# Patient Record
Sex: Male | Born: 1960 | Race: Black or African American | Hispanic: No | Smoking: Current every day smoker
Health system: Southern US, Community
[De-identification: ages and names within clinical notes are randomized; demographics above are authoritative.]

## PROBLEM LIST (undated history)

## (undated) DIAGNOSIS — R519 Headache, unspecified: Secondary | ICD-10-CM

## (undated) DIAGNOSIS — M542 Cervicalgia: Secondary | ICD-10-CM

## (undated) DIAGNOSIS — M47812 Spondylosis without myelopathy or radiculopathy, cervical region: Secondary | ICD-10-CM

## (undated) DIAGNOSIS — Z8719 Personal history of other diseases of the digestive system: Secondary | ICD-10-CM

## (undated) DIAGNOSIS — Z9889 Other specified postprocedural states: Secondary | ICD-10-CM

## (undated) DIAGNOSIS — F319 Bipolar disorder, unspecified: Secondary | ICD-10-CM

## (undated) DIAGNOSIS — F32A Depression, unspecified: Secondary | ICD-10-CM

## (undated) DIAGNOSIS — G8929 Other chronic pain: Secondary | ICD-10-CM

## (undated) DIAGNOSIS — M25519 Pain in unspecified shoulder: Secondary | ICD-10-CM

## (undated) DIAGNOSIS — R51 Headache: Secondary | ICD-10-CM

## (undated) DIAGNOSIS — I10 Essential (primary) hypertension: Secondary | ICD-10-CM

## (undated) DIAGNOSIS — F1410 Cocaine abuse, uncomplicated: Secondary | ICD-10-CM

## (undated) DIAGNOSIS — M1990 Unspecified osteoarthritis, unspecified site: Secondary | ICD-10-CM

## (undated) DIAGNOSIS — N179 Acute kidney failure, unspecified: Secondary | ICD-10-CM

## (undated) DIAGNOSIS — E7800 Pure hypercholesterolemia, unspecified: Secondary | ICD-10-CM

## (undated) DIAGNOSIS — F329 Major depressive disorder, single episode, unspecified: Secondary | ICD-10-CM

## (undated) DIAGNOSIS — Z8711 Personal history of peptic ulcer disease: Secondary | ICD-10-CM

## (undated) DIAGNOSIS — E119 Type 2 diabetes mellitus without complications: Secondary | ICD-10-CM

## (undated) DIAGNOSIS — J189 Pneumonia, unspecified organism: Secondary | ICD-10-CM

## (undated) DIAGNOSIS — R569 Unspecified convulsions: Secondary | ICD-10-CM

## (undated) HISTORY — DX: Bipolar disorder, unspecified: F31.9

## (undated) HISTORY — PX: BACK SURGERY: SHX140

## (undated) HISTORY — DX: Spondylosis without myelopathy or radiculopathy, cervical region: M47.812

## (undated) HISTORY — DX: Cocaine abuse, uncomplicated: F14.10

---

## 1998-01-12 ENCOUNTER — Emergency Department (HOSPITAL_COMMUNITY): Admission: EM | Admit: 1998-01-12 | Discharge: 1998-01-12 | Payer: Self-pay | Admitting: Emergency Medicine

## 1999-10-01 ENCOUNTER — Emergency Department (HOSPITAL_COMMUNITY): Admission: EM | Admit: 1999-10-01 | Discharge: 1999-10-01 | Payer: Self-pay | Admitting: Emergency Medicine

## 2001-05-01 ENCOUNTER — Emergency Department (HOSPITAL_COMMUNITY): Admission: EM | Admit: 2001-05-01 | Discharge: 2001-05-01 | Payer: Self-pay | Admitting: Emergency Medicine

## 2002-07-24 ENCOUNTER — Emergency Department (HOSPITAL_COMMUNITY): Admission: EM | Admit: 2002-07-24 | Discharge: 2002-07-24 | Payer: Self-pay | Admitting: Emergency Medicine

## 2002-07-24 ENCOUNTER — Encounter: Payer: Self-pay | Admitting: Emergency Medicine

## 2003-12-13 ENCOUNTER — Emergency Department (HOSPITAL_COMMUNITY): Admission: EM | Admit: 2003-12-13 | Discharge: 2003-12-14 | Payer: Self-pay | Admitting: Emergency Medicine

## 2004-05-05 HISTORY — PX: ANTERIOR CERVICAL DECOMP/DISCECTOMY FUSION: SHX1161

## 2004-06-18 ENCOUNTER — Emergency Department (HOSPITAL_COMMUNITY): Admission: EM | Admit: 2004-06-18 | Discharge: 2004-06-18 | Payer: Self-pay | Admitting: Emergency Medicine

## 2004-08-12 ENCOUNTER — Ambulatory Visit: Payer: Self-pay | Admitting: Internal Medicine

## 2004-08-20 ENCOUNTER — Ambulatory Visit: Payer: Self-pay | Admitting: Family Medicine

## 2004-08-20 ENCOUNTER — Ambulatory Visit (HOSPITAL_COMMUNITY): Admission: RE | Admit: 2004-08-20 | Discharge: 2004-08-20 | Payer: Self-pay | Admitting: Family Medicine

## 2004-08-21 ENCOUNTER — Ambulatory Visit: Payer: Self-pay | Admitting: *Deleted

## 2004-09-02 ENCOUNTER — Ambulatory Visit: Payer: Self-pay | Admitting: Family Medicine

## 2004-09-04 ENCOUNTER — Ambulatory Visit (HOSPITAL_COMMUNITY): Admission: RE | Admit: 2004-09-04 | Discharge: 2004-09-04 | Payer: Self-pay | Admitting: Family Medicine

## 2004-09-20 ENCOUNTER — Ambulatory Visit: Payer: Self-pay | Admitting: Family Medicine

## 2005-01-27 ENCOUNTER — Ambulatory Visit: Payer: Self-pay | Admitting: Family Medicine

## 2005-01-30 ENCOUNTER — Inpatient Hospital Stay (HOSPITAL_COMMUNITY): Admission: RE | Admit: 2005-01-30 | Discharge: 2005-02-01 | Payer: Self-pay | Admitting: Neurosurgery

## 2005-07-28 ENCOUNTER — Ambulatory Visit: Payer: Self-pay | Admitting: Family Medicine

## 2005-08-20 ENCOUNTER — Ambulatory Visit: Payer: Self-pay | Admitting: Family Medicine

## 2005-09-26 ENCOUNTER — Ambulatory Visit: Payer: Self-pay | Admitting: Family Medicine

## 2005-11-24 ENCOUNTER — Ambulatory Visit: Payer: Self-pay | Admitting: Family Medicine

## 2005-12-27 IMAGING — CR DG THORACIC SPINE 2V
4 series · 4 of 4 positions shown · non-contrast
Comparison: none

CLINICAL DATA: Fall 2 months ago with neck, right shoulder, and upper back pain

RIGHT SHOULDER - 3  VIEW:

[w swimmers view]
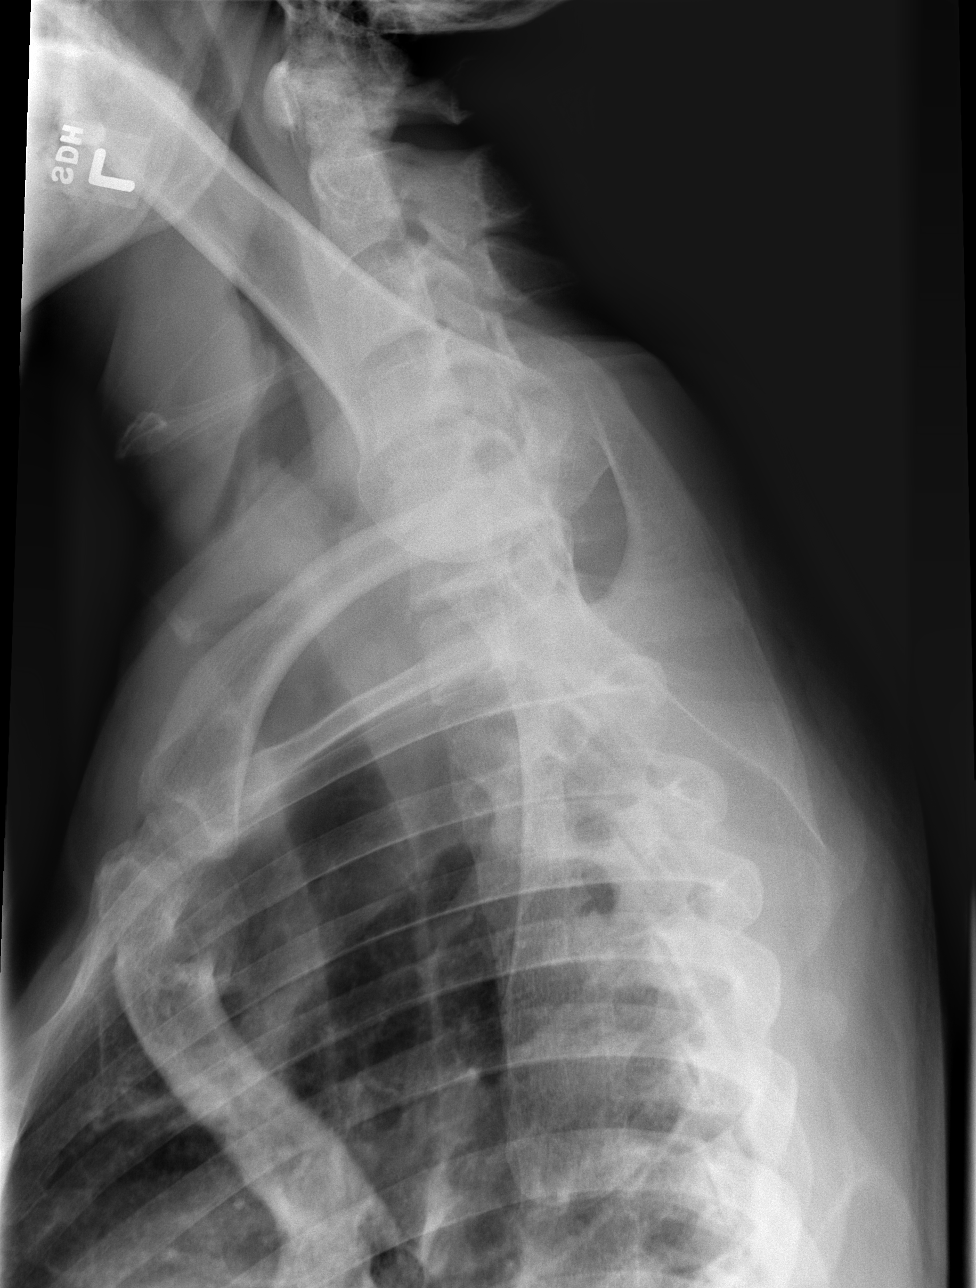

[t t-spine a.p.]
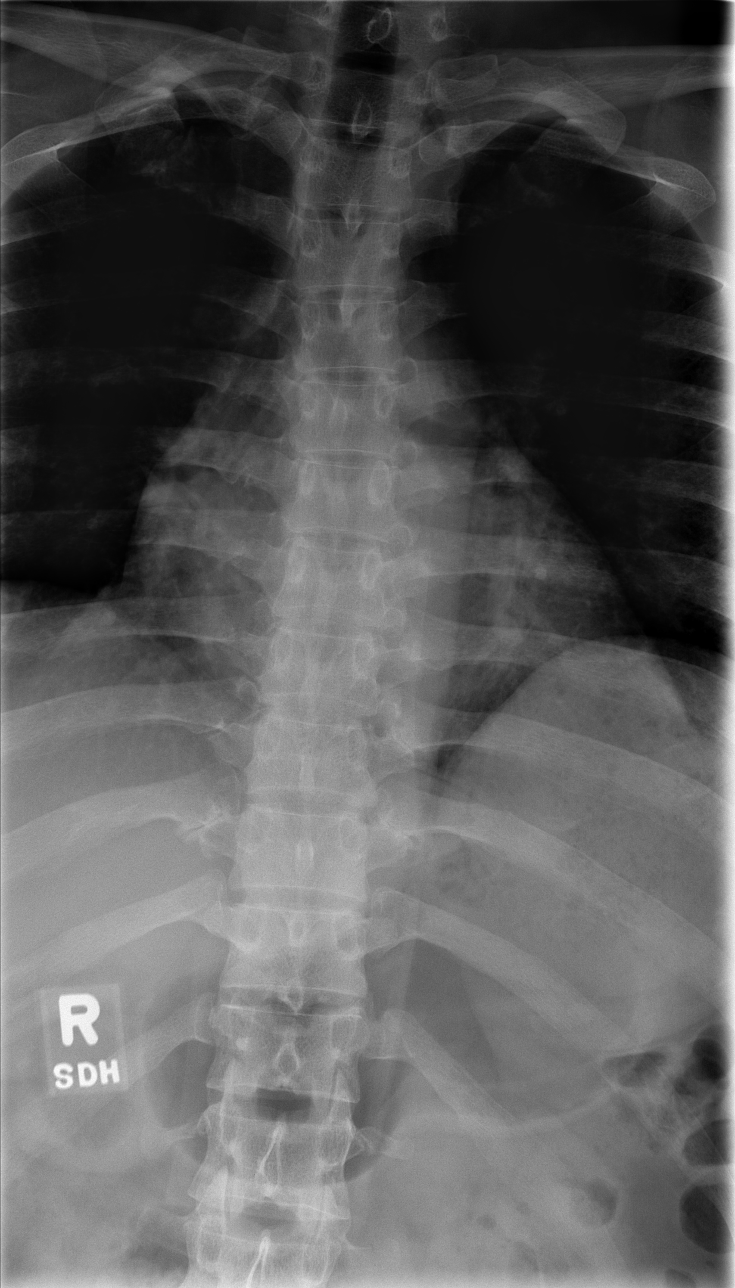

[t t-spine lat *]
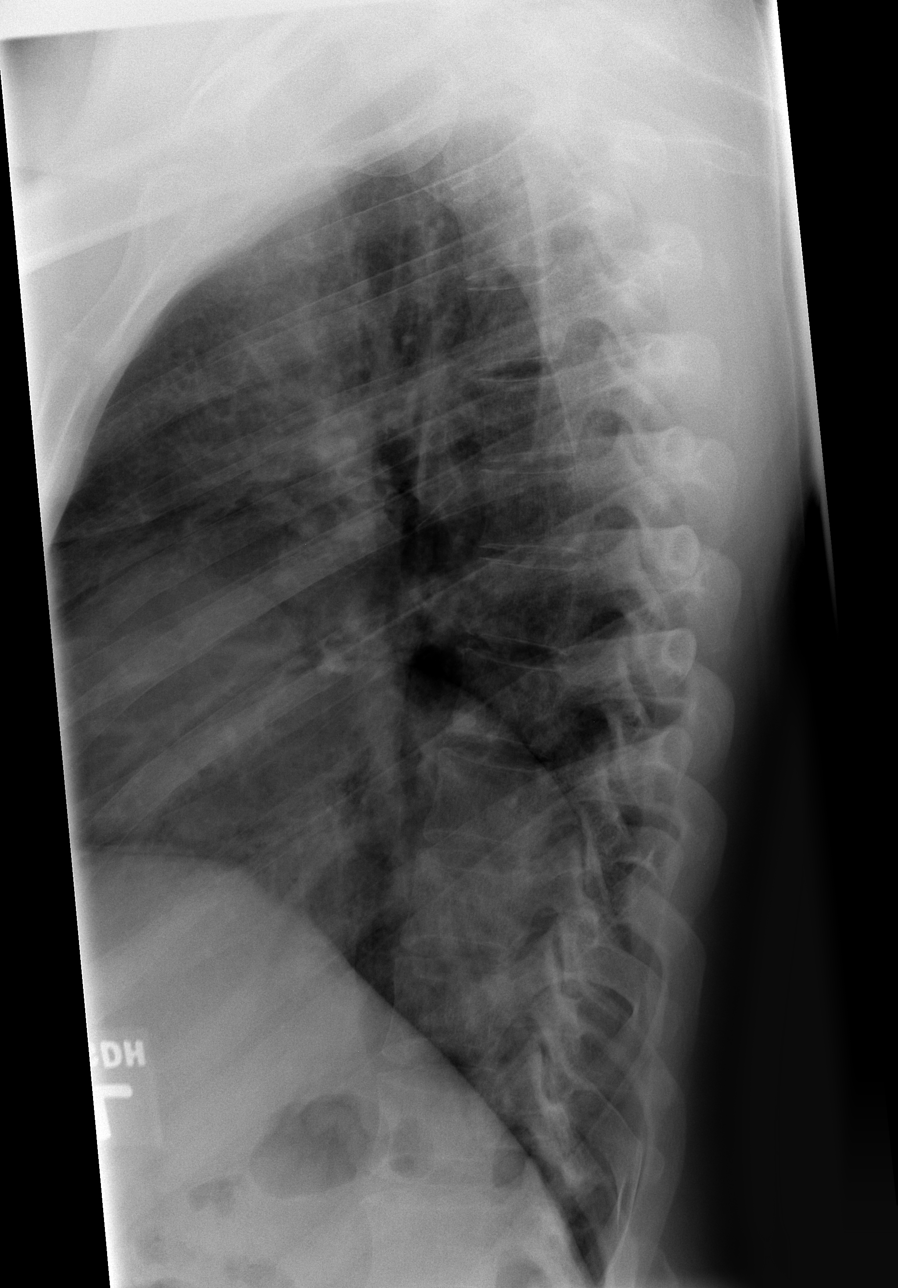

[t t-spine lat]
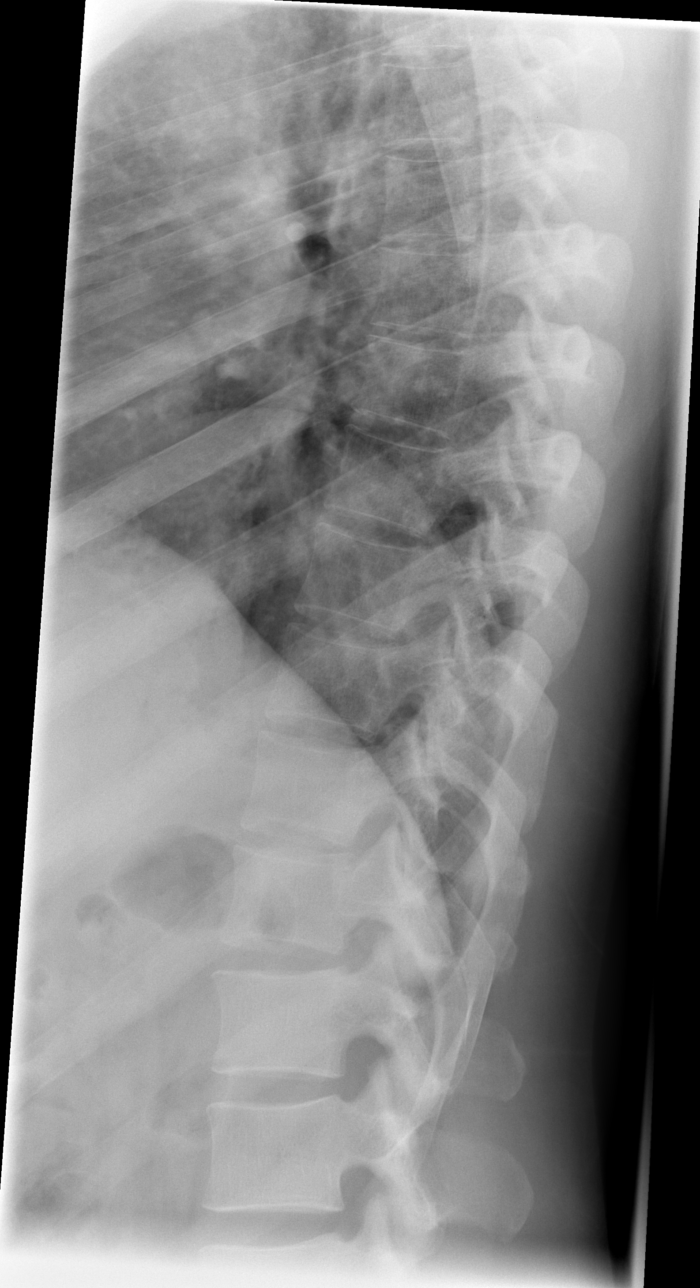

[4 of 4 positions shown; findings below may reference images not displayed]

FINDINGS: There is no evidence of fracture or dislocation.  There is no
evidence of arthropathy or other focal bone abnormality.  Soft tissues are
unremarkable.
IMPRESSION: Negative.

THORACIC SPINE - 2  VIEW:
FINDINGS: There is no evidence of thoracic spine fracture.  Alignment is
normal.  No other significant bone abnormalities are identified.
IMPRESSION: Negative thoracic spine radiographs.

LEFT SHOULDER - 3  VIEW:
FINDINGS: There is no evidence of fracture or dislocation.  There is no
evidence of arthropathy or other focal bone abnormality.  Soft tissues are
unremarkable.
IMPRESSION: Negative.

## 2006-03-09 ENCOUNTER — Ambulatory Visit: Payer: Self-pay | Admitting: Family Medicine

## 2006-03-18 ENCOUNTER — Ambulatory Visit (HOSPITAL_COMMUNITY): Admission: RE | Admit: 2006-03-18 | Discharge: 2006-03-18 | Payer: Self-pay | Admitting: Family Medicine

## 2006-04-17 ENCOUNTER — Ambulatory Visit (HOSPITAL_COMMUNITY): Admission: RE | Admit: 2006-04-17 | Discharge: 2006-04-17 | Payer: Self-pay | Admitting: Neurosurgery

## 2006-08-17 ENCOUNTER — Ambulatory Visit: Payer: Self-pay | Admitting: Family Medicine

## 2006-12-05 ENCOUNTER — Emergency Department (HOSPITAL_COMMUNITY): Admission: EM | Admit: 2006-12-05 | Discharge: 2006-12-05 | Payer: Self-pay | Admitting: Emergency Medicine

## 2007-01-20 ENCOUNTER — Encounter (INDEPENDENT_AMBULATORY_CARE_PROVIDER_SITE_OTHER): Payer: Self-pay | Admitting: *Deleted

## 2007-07-13 ENCOUNTER — Ambulatory Visit: Payer: Self-pay | Admitting: Family Medicine

## 2007-09-13 ENCOUNTER — Ambulatory Visit: Payer: Self-pay | Admitting: Family Medicine

## 2007-09-13 LAB — CONVERTED CEMR LAB
ALT: 34 units/L (ref 0–53)
AST: 25 units/L (ref 0–37)
CO2: 24 meq/L (ref 19–32)
Calcium: 9.4 mg/dL (ref 8.4–10.5)
Chloride: 104 meq/L (ref 96–112)
Creatinine, Ser: 1.16 mg/dL (ref 0.40–1.50)
LDL Cholesterol: 193 mg/dL — ABNORMAL HIGH (ref 0–99)
PSA: 0.46 ng/mL (ref 0.10–4.00)
Sodium: 141 meq/L (ref 135–145)
Total Bilirubin: 0.7 mg/dL (ref 0.3–1.2)
Total Protein: 7.5 g/dL (ref 6.0–8.3)
Triglycerides: 158 mg/dL — ABNORMAL HIGH (ref <150)

## 2007-10-07 ENCOUNTER — Ambulatory Visit: Payer: Self-pay | Admitting: Internal Medicine

## 2008-04-26 ENCOUNTER — Emergency Department (HOSPITAL_COMMUNITY): Admission: EM | Admit: 2008-04-26 | Discharge: 2008-04-26 | Payer: Self-pay | Admitting: Emergency Medicine

## 2008-09-19 ENCOUNTER — Ambulatory Visit: Payer: Self-pay | Admitting: Family Medicine

## 2008-09-27 ENCOUNTER — Ambulatory Visit (HOSPITAL_COMMUNITY): Admission: RE | Admit: 2008-09-27 | Discharge: 2008-09-27 | Payer: Self-pay | Admitting: Family Medicine

## 2008-11-21 ENCOUNTER — Ambulatory Visit: Payer: Self-pay | Admitting: Family Medicine

## 2009-01-02 ENCOUNTER — Ambulatory Visit: Payer: Self-pay | Admitting: Family Medicine

## 2009-02-28 ENCOUNTER — Emergency Department (HOSPITAL_COMMUNITY): Admission: EM | Admit: 2009-02-28 | Discharge: 2009-02-28 | Payer: Self-pay | Admitting: Emergency Medicine

## 2009-06-21 ENCOUNTER — Ambulatory Visit: Payer: Self-pay | Admitting: Family Medicine

## 2009-06-21 LAB — CONVERTED CEMR LAB
ALT: 22 units/L (ref 0–53)
AST: 16 units/L (ref 0–37)
Albumin: 4.8 g/dL (ref 3.5–5.2)
Calcium: 10.2 mg/dL (ref 8.4–10.5)
Chloride: 104 meq/L (ref 96–112)
Creatinine, Ser: 1.2 mg/dL (ref 0.40–1.50)
Free T4: 1.13 ng/dL (ref 0.80–1.80)
Hemoglobin: 16.3 g/dL (ref 13.0–17.0)
Lymphocytes Relative: 44 % (ref 12–46)
Lymphs Abs: 2.9 10*3/uL (ref 0.7–4.0)
Monocytes Relative: 8 % (ref 3–12)
Neutro Abs: 2.9 10*3/uL (ref 1.7–7.7)
Neutrophils Relative %: 45 % (ref 43–77)
Potassium: 4.3 meq/L (ref 3.5–5.3)
RBC: 5.47 M/uL (ref 4.22–5.81)
Sed Rate: 4 mm/hr (ref 0–16)
Sodium: 140 meq/L (ref 135–145)
TSH: 1.873 microintl units/mL (ref 0.350–4.500)
Total CHOL/HDL Ratio: 6.8
Total Protein: 7.8 g/dL (ref 6.0–8.3)
WBC: 6.4 10*3/uL (ref 4.0–10.5)

## 2009-07-17 ENCOUNTER — Ambulatory Visit: Payer: Self-pay | Admitting: Family Medicine

## 2009-07-25 ENCOUNTER — Ambulatory Visit: Payer: Self-pay | Admitting: Family Medicine

## 2010-09-20 NOTE — Op Note (Signed)
NAMEJAMEAL, Eduardo French              ACCOUNT NO.:  [id number]   MEDICAL RECORD NO.:  [id number]          PATIENT TYPE:  INP   LOCATION:  2864                         FACILITY:  [facility]   PHYSICIAN:  Hewitt Shorts, M.D.DATE OF BIRTH:  Sep 22, 1960   DATE OF PROCEDURE:  01/30/2005  DATE OF DISCHARGE:                                 OPERATIVE REPORT   PREOPERATIVE DIAGNOSIS:  Cervical disk herniation, cervical spondylosis,  cervical degenerative disease, cervical radiculopathy.   POSTOP DIAGNOSIS:  Cervical disk herniation, cervical spondylosis, cervical  degenerative disease, cervical radiculopathy.   PROCEDURE:  C4-5, C6-7 and C6-7 anterior cervical diskectomy and  arthrodesis with the VG2 allograft and tether cervical plating.   SURGEON:  Hewitt Shorts, M.D.   ASSISTANT:  Clydene Fake, M.D.   ANESTHESIA:  General tracheal.   INDICATIONS:  The patient is a 50 year old man who had presented with neck,  interscapular and parascapular and right cervical radicular pain.  He had a  large right C5-6 cervical disk herniation and advanced spondylosis and  degenerative disk disease at C4-5, C5-6, and C6-7 with significant canal  stenosis at each of these levels. Decision was made to proceed with a three-  level anterior cervical diskectomy and arthrodesis for decompression.   PROCEDURE:  The patient was brought to the operating room and placed under  general endotracheal anesthesia.  The patient placed in 10 pounds of halter  traction. The neck was prepped with Betadine soap solution, draped in  sterile fashion. A oblique incision was made left side of the neck  paralleling the anterior border left sternocleidomastoid. The line of  incision was infiltrated with local anesthetic with epinephrine. Incision  made carried down through subcutaneous tissue and platysma and dissection  was carried through an avascular plane leaving the sternocleidomastoid,  carotid artery and jugular  vein laterally, trachea, esophagus medially. The  ventral aspect of vertebral column was identified and localizing x-ray  taken. The C4-5, C5-6 and C6-7 intervertebral disk spaces identified.  Diskectomyu was begun at each level with incision of the annulus, continued  with micro curettes, pituitary rongeurs. The cartilaginous endplates of the  corresponding vertebra removed using micro curettes along with the X Max  drill.  Microscope was then draped and brought into the field to provide  additional magnification, illumination and visualization. The remainder of  decompression was performed using microdissection and microsurgical  technique. Posterior osteophytic overgrowth was removed at each level using  the X Max drill along with the 2 mm Kerrison punch with a thin foot plate.  The posterior longitudinal ligament was then carefully removed at the C5-6  level. We did encounter the disk herniation. This was removed and we  achieved good decompression of the spinal canal and thecal sac and neural  foramen and nerve roots at each level bilaterally.  Once decompression was  completed, hemostasis established using Gelfoam soaked in thrombin. We  measured the height to the intervertebral disk spaces using bone sizer and  then selected 6 mm interbody implants. We used VG2 parallel allograft. Each  was hydrated in saline solution and positioned in  the intervertebral disk  space and countersunk.  We then selected a 49 mm tether cervical plate.  It  was positioned over the fusion construct and secured to the C4 and C7  vertebrae with a pair of 4 x 15 meters screws, and to the C5-C6 vertebra  with a single 4 x 15 mm screw at each level. Each screw hole was drilled and  tapped and screws placed in alternating fashion. Final tightening was done  of all six screws and x-ray showed the graft in good position, plate and  screws in good position.  The alignment was good. We then discontinued  cervical  traction. The wound was irrigated with bacitracin solution, checked  for hemostasis, which was established and confirmed and then we proceeded  with closure. The platysma was closed with interrupted, inverted 2-0 undyed  Vicryl sutures and the subcutaneous and subcuticular layer closed with  interrupted inverted 3-0 undyed Vicryl sutures, and the skin was  approximated with Dermabond.  The procedure was tolerated well.  The  estimated blood loss was 75 cc. Sponge needle count correct. Following  surgery the patient was to be reversed from anesthetic, extubated,  transferred to recovery room for further care.      Hewitt Shorts, M.D.  Electronically Signed     RWN/MEDQ  D:  01/30/2005  T:  01/30/2005  Job:  [id number]

## 2011-03-30 ENCOUNTER — Emergency Department (HOSPITAL_COMMUNITY)
Admission: EM | Admit: 2011-03-30 | Discharge: 2011-03-30 | Disposition: A | Discharge status: Home / Self Care | Payer: Medicaid Other | Attending: Emergency Medicine | Admitting: Emergency Medicine

## 2011-03-30 ENCOUNTER — Encounter: Payer: Self-pay | Admitting: Emergency Medicine

## 2011-03-30 DIAGNOSIS — J40 Bronchitis, not specified as acute or chronic: Secondary | ICD-10-CM | POA: Insufficient documentation

## 2011-03-30 DIAGNOSIS — F17200 Nicotine dependence, unspecified, uncomplicated: Secondary | ICD-10-CM | POA: Insufficient documentation

## 2011-03-30 DIAGNOSIS — I10 Essential (primary) hypertension: Secondary | ICD-10-CM | POA: Insufficient documentation

## 2011-03-30 HISTORY — DX: Major depressive disorder, single episode, unspecified: F32.9

## 2011-03-30 HISTORY — DX: Essential (primary) hypertension: I10

## 2011-03-30 HISTORY — DX: Depression, unspecified: F32.A

## 2011-03-30 MED ORDER — IBUPROFEN 800 MG PO TABS
800.0000 mg | ORAL_TABLET | Freq: Once | ORAL | Status: AC
  Administered 2011-03-30: 800 mg via ORAL
  Filled 2011-03-30: qty 1

## 2011-03-30 MED ORDER — AZITHROMYCIN 250 MG PO TABS: ORAL_TABLET | ORAL | Status: AC

## 2011-03-30 MED ORDER — ALBUTEROL SULFATE HFA 108 (90 BASE) MCG/ACT IN AERS: INHALATION_SPRAY | RESPIRATORY_TRACT | Status: DC

## 2011-03-30 MED ORDER — ACETAMINOPHEN-CODEINE #3 300-30 MG PO TABS: 1.0000 | ORAL_TABLET | Freq: Four times a day (QID) | ORAL | Status: AC | PRN

## 2011-03-30 NOTE — ED Notes (Signed)
Pt presenting to ed with c/o cough x 2 months pt states pain with cough. Pt states he was seen x 2 weeks ago for the same and was told it was his sinuses. Pt states he's coughing up greenish/yellow sputum. Pt states symptoms are worse

## 2011-03-30 NOTE — ED Provider Notes (Signed)
History     CSN: [id number] Arrival date & time: 03/30/2011  5:50 PM   First MD Initiated Contact with Patient 03/30/11 1829      Chief Complaint  Patient presents with  . Cough    (Consider location/radiation/quality/duration/timing/severity/associated sxs/prior treatment) HPI  Patient presents to ER complaining of a couple month hx of waxing and waning but persistent cough stating productive cough that he went to PCP, [facility], 2 weeks ago and states "they just said it was my sinuses" but states ongoing cough. Denies known fevers but has felt chilled. Denies HA, dizziness, stiff neck, wheezing, difficulty breathing, rash, abdominal pain, n/v. Patient states he smokes a few cigarettes daily. Denies aggravating or alleviating factors.    Past Medical History  Diagnosis Date  . Hypertension   . Depression     Past Surgical History  Procedure Date  . Back surgery     History reviewed. No pertinent family history.  History  Substance Use Topics  . Smoking status: Current Some Day Smoker  . Smokeless tobacco: Not on file  . Alcohol Use: Yes     occasionally      Review of Systems  All other systems reviewed and are negative.    Allergies  Review of patient's allergies indicates no known allergies.  Home Medications  No current outpatient prescriptions on file.  BP 168/89  Pulse 84  Temp(Src) 98.3 F (36.8 C) (Oral)  Resp 20  SpO2 98%  Physical Exam  Nursing note and vitals reviewed. Constitutional: He is oriented to person, place, and time. He appears well-developed and well-nourished.  Non-toxic appearance. No distress.  HENT:  Head: Normocephalic and atraumatic.  Eyes: Conjunctivae are normal.  Neck: Normal range of motion. Neck supple.  Cardiovascular: Normal rate, regular rhythm, normal heart sounds and intact distal pulses.  Exam reveals no gallop and no friction rub.   No murmur heard. Pulmonary/Chest: Effort normal and breath sounds normal.  No respiratory distress. He has no wheezes. He has no rales. He exhibits no tenderness.  Abdominal: Bowel sounds are normal. He exhibits no distension and no mass. There is no tenderness. There is no rebound and no guarding.  Musculoskeletal: Normal range of motion. He exhibits no edema and no tenderness.  Lymphadenopathy:    He has no cervical adenopathy.  Neurological: He is alert and oriented to person, place, and time.  Skin: Skin is warm and dry. No rash noted. He is not diaphoretic. No erythema.  Psychiatric: He has a normal mood and affect.    ED Course  Procedures (including critical care time)  PO ibuprofen  Labs Reviewed - No data to display No results found.   1. Bronchitis   2. Hypertension       MDM  Smoker with 2 month hx of cough. Will treat like bronchitis. Afebrile and nontoxic appearing with pulse ox 98% on room air.         Jenness [place], [place] 03/30/11 [phone]

## 2011-03-31 NOTE — ED Provider Notes (Signed)
Medical screening examination/treatment/procedure(s) were performed by non-physician practitioner and as supervising physician I was immediately available for consultation/collaboration.   Benuel Ly M Gaspar Fowle, MD 03/31/11 0035 

## 2011-07-03 ENCOUNTER — Emergency Department (HOSPITAL_COMMUNITY)
Admission: EM | Admit: 2011-07-03 | Discharge: 2011-07-03 | Disposition: A | Discharge status: Home / Self Care | Payer: Medicaid Other | Attending: Emergency Medicine | Admitting: Emergency Medicine

## 2011-07-03 ENCOUNTER — Encounter (HOSPITAL_COMMUNITY): Payer: Self-pay | Admitting: Emergency Medicine

## 2011-07-03 ENCOUNTER — Emergency Department (HOSPITAL_COMMUNITY): Payer: Medicaid Other

## 2011-07-03 DIAGNOSIS — R350 Frequency of micturition: Secondary | ICD-10-CM | POA: Insufficient documentation

## 2011-07-03 DIAGNOSIS — R05 Cough: Secondary | ICD-10-CM

## 2011-07-03 DIAGNOSIS — R5381 Other malaise: Secondary | ICD-10-CM | POA: Insufficient documentation

## 2011-07-03 DIAGNOSIS — I10 Essential (primary) hypertension: Secondary | ICD-10-CM | POA: Insufficient documentation

## 2011-07-03 DIAGNOSIS — J40 Bronchitis, not specified as acute or chronic: Secondary | ICD-10-CM

## 2011-07-03 DIAGNOSIS — R059 Cough, unspecified: Secondary | ICD-10-CM

## 2011-07-03 DIAGNOSIS — R358 Other polyuria: Secondary | ICD-10-CM | POA: Insufficient documentation

## 2011-07-03 DIAGNOSIS — H538 Other visual disturbances: Secondary | ICD-10-CM | POA: Insufficient documentation

## 2011-07-03 DIAGNOSIS — R631 Polydipsia: Secondary | ICD-10-CM | POA: Insufficient documentation

## 2011-07-03 DIAGNOSIS — E1169 Type 2 diabetes mellitus with other specified complication: Secondary | ICD-10-CM | POA: Insufficient documentation

## 2011-07-03 DIAGNOSIS — R3589 Other polyuria: Secondary | ICD-10-CM

## 2011-07-03 DIAGNOSIS — H547 Unspecified visual loss: Secondary | ICD-10-CM | POA: Insufficient documentation

## 2011-07-03 DIAGNOSIS — R739 Hyperglycemia, unspecified: Secondary | ICD-10-CM

## 2011-07-03 LAB — CBC
HCT: 43.9 % (ref 39.0–52.0)
Hemoglobin: 16.2 g/dL (ref 13.0–17.0)
MCH: 29.9 pg (ref 26.0–34.0)
MCHC: 36.9 g/dL — ABNORMAL HIGH (ref 30.0–36.0)
MCV: 81 fL (ref 78.0–100.0)
Platelets: 252 K/uL (ref 150–400)
RBC: 5.42 MIL/uL (ref 4.22–5.81)
RDW: 12.2 % (ref 11.5–15.5)
WBC: 5.5 10*3/uL (ref 4.0–10.5)

## 2011-07-03 LAB — URINALYSIS, ROUTINE W REFLEX MICROSCOPIC
Bilirubin Urine: NEGATIVE
Glucose, UA: >1000 mg/dL — AB
Hgb urine dipstick: NEGATIVE
Ketones, ur: NEGATIVE mg/dL
Leukocytes, UA: NEGATIVE
Nitrite: NEGATIVE
Protein, ur: NEGATIVE mg/dL
Specific Gravity, Urine: 1.03 (ref 1.005–1.030)
Urobilinogen, UA: 0.2 mg/dL (ref 0.0–1.0)
pH: 5.5 (ref 5.0–8.0)

## 2011-07-03 LAB — GLUCOSE, CAPILLARY
Glucose-Capillary: 247 mg/dL — ABNORMAL HIGH (ref 70–99)
Glucose-Capillary: 109 mg/dL — ABNORMAL HIGH (ref 70–99)

## 2011-07-03 LAB — DIFFERENTIAL
Basophils Absolute: 0 10*3/uL (ref 0.0–0.1)
Basophils Relative: 0 % (ref 0–1)
Eosinophils Absolute: 0.1 K/uL (ref 0.0–0.7)
Eosinophils Relative: 1 % (ref 0–5)
Lymphocytes Relative: 39 % (ref 12–46)
Lymphs Abs: 2.1 K/uL (ref 0.7–4.0)
Monocytes Absolute: 0.4 10*3/uL (ref 0.1–1.0)
Monocytes Relative: 7 % (ref 3–12)
Neutro Abs: 2.9 10*3/uL (ref 1.7–7.7)
Neutrophils Relative %: 52 % (ref 43–77)

## 2011-07-03 LAB — BASIC METABOLIC PANEL
Chloride: 90 mEq/L — ABNORMAL LOW (ref 96–112)
GFR calc Af Amer: >90 mL/min (ref >90)
Potassium: 4.4 mEq/L (ref 3.5–5.1)
Sodium: 125 mEq/L — ABNORMAL LOW (ref 135–145)

## 2011-07-03 LAB — BASIC METABOLIC PANEL WITH GFR
BUN: 11 mg/dL (ref 6–23)
CO2: 24 meq/L (ref 19–32)
Calcium: 10.2 mg/dL (ref 8.4–10.5)
Creatinine, Ser: 1 mg/dL (ref 0.50–1.35)
GFR calc non Af Amer: 86 mL/min — ABNORMAL LOW (ref >90)
Glucose, Bld: 704 mg/dL (ref 70–99)

## 2011-07-03 MED ORDER — LIVING WELL WITH DIABETES BOOK
Freq: Once | Status: AC
  Administered 2011-07-03
  Filled 2011-07-03: qty 1

## 2011-07-03 MED ORDER — DEXTROSE-NACL 5-0.45 % IV SOLN
INTRAVENOUS | Status: DC
  Administered 2011-07-03: 23:00:00 via INTRAVENOUS

## 2011-07-03 MED ORDER — INSULIN REGULAR BOLUS VIA INFUSION
0.0000 [IU] | Freq: Three times a day (TID) | INTRAVENOUS | Status: DC
  Filled 2011-07-03: qty 10

## 2011-07-03 MED ORDER — METFORMIN HCL 500 MG PO TABS: 500.0000 mg | ORAL_TABLET | Freq: Two times a day (BID) | ORAL | Status: DC

## 2011-07-03 MED ORDER — BENZONATATE 100 MG PO CAPS: 100.0000 mg | ORAL_CAPSULE | Freq: Three times a day (TID) | ORAL | Status: AC | PRN

## 2011-07-03 MED ORDER — SODIUM CHLORIDE 0.9 % IV BOLUS (SEPSIS)
1000.0000 mL | Freq: Once | INTRAVENOUS | Status: AC
  Administered 2011-07-03: 1000 mL via INTRAVENOUS

## 2011-07-03 MED ORDER — DEXTROSE 50 % IV SOLN: 25.0000 mL | INTRAVENOUS | Status: DC | PRN

## 2011-07-03 MED ORDER — SODIUM CHLORIDE 0.9 % IV SOLN
INTRAVENOUS | Status: DC
  Administered 2011-07-03: 7 [IU]/h via INTRAVENOUS
  Filled 2011-07-03: qty 1

## 2011-07-03 MED ORDER — SODIUM CHLORIDE 0.9 % IV SOLN
INTRAVENOUS | Status: DC
  Administered 2011-07-03: 18:00:00 via INTRAVENOUS

## 2011-07-03 NOTE — ED Notes (Signed)
Having urianary  freq and has a cough that keeps  Coming back and is thirsty

## 2011-07-03 NOTE — ED Provider Notes (Addendum)
I saw and evaluated the patient, reviewed the resident's note and I agree with the findings and plan.  Pt with new onset diabetes, severely hyperglycemic with no vomiting, no ketones in urine.  May be able to aggressively rehydrate, IV insulin and consider placement in CDU protocol obs for continued treatment overnight, diabetic teaching, placing on oral diabetic medication and ensuring outpt follow up.  If exclusion criteria are still in place despite initial treatment, will admit.  Stable vitals, no CP, no airway issues.   Eduardo Gracie Y.   Gavin Pound. Oletta Lamas, MD 07/03/11 2033  Gavin Pound. Oletta Lamas, MD 07/04/11 0981

## 2011-07-03 NOTE — ED Provider Notes (Signed)
Patient is in CDU under hyperglycemia protocol.  Pt is a new onset diabetic. Patient reports he is feeling much better now that his blood sugar has decreased to 247.  Discussed results and plan with patient who verbalizes understanding.  Plan as discussed with resident and Dr Oletta Lamas, is for patient to be d/c home with Metformin 500mg  BID and tessalon perles for cough.  Patient has follow up appointment with [facility] on March 18, but I have instructed him to attempt to move the appointment up or follow up with urgent care in the interim to a recheck of his blood sugar.  Pt to return to ED for worsening symptoms.  Pt given education material in ED.  Patient verbalizes understanding and agrees with plan.    Eduardo French [place], [place] 07/03/11 [phone]

## 2011-07-03 NOTE — ED Provider Notes (Signed)
History     CSN: [id number]  Arrival date & time 07/03/11  1407   First MD Initiated Contact with Patient 07/03/11 1544      Chief Complaint  Patient presents with  . Urinary Frequency  . Weakness  . Blurred Vision  . Polydipsia  . Loss of Vision    (Consider location/radiation/quality/duration/timing/severity/associated sxs/prior treatment) HPI Comments: 51 year old male with a history of depression and hypertension presenting with a 2 to three-week history of generalized weakness, fatigue, polyuria and polydipsia. He states he is continually thirsty and urinating roughly every 30 minutes. He denies any personal history of diabetes but does endorse a family history of diabetes. He denies any chest pain, shortness of breath, nausea, vomiting or diarrhea. He has had a dry cough for the past 2 weeks.  Patient is a 51 y.o. male presenting with general illness. The history is provided by the patient.  Illness  Episode onset: About 2 weeks ago. The problem occurs continuously. The problem has been unchanged. The problem is moderate. The symptoms are relieved by nothing. The symptoms are aggravated by nothing. Associated symptoms include cough (cough, dry for the past 2 weeks.). Pertinent negatives include no fever, no decreased vision, no double vision, no photophobia, no abdominal pain, no diarrhea, no nausea, no vomiting, no congestion and no URI. Associated symptoms comments: Transient episode of blurry vision 2 days ago which has resolved..    Past Medical History  Diagnosis Date  . Hypertension   . Depression     Past Surgical History  Procedure Date  . Back surgery     No family history on file.  History  Substance Use Topics  . Smoking status: Current Some Day Smoker  . Smokeless tobacco: Not on file  . Alcohol Use: Yes     occasionally      Review of Systems  Constitutional: Negative for fever.  HENT: Negative for congestion.   Eyes: Negative for double vision  and photophobia.  Respiratory: Positive for cough (cough, dry for the past 2 weeks.).   Gastrointestinal: Negative for nausea, vomiting, abdominal pain and diarrhea.  Genitourinary:       Urinary frequency.  Neurological: Positive for weakness (generalized.). Negative for dizziness.  All other systems reviewed and are negative.    Allergies  Review of patient's allergies indicates no known allergies.  Home Medications   Current Outpatient Rx  Name Route Sig Dispense Refill  . ALBUTEROL SULFATE HFA 108 (90 BASE) MCG/ACT IN AERS Inhalation Inhale 2 puffs into the lungs every 6 (six) hours as needed. For cough or wheeze.    Marland Kitchen ESCITALOPRAM OXALATE 20 MG PO TABS Oral Take 30 mg by mouth daily.     Marland Kitchen HYDROCODONE-ACETAMINOPHEN 5-500 MG PO TABS Oral Take 1 tablet by mouth every 6 (six) hours as needed. For pain.    Marland Kitchen QUETIAPINE FUMARATE 100 MG PO TABS Oral Take 150 mg by mouth at bedtime.    . TAMSULOSIN HCL 0.4 MG PO CAPS Oral Take by mouth.    . VERAPAMIL HCL 240 MG PO TBCR Oral Take 240 mg by mouth daily with breakfast.        BP 138/92  Pulse 78  Temp(Src) 97.8 F (36.6 C) (Oral)  Resp 20  SpO2 98%  Physical Exam  Nursing note and vitals reviewed. Constitutional: He is oriented to person, place, and time. He appears well-developed and well-nourished. No distress.  HENT:  Head: Normocephalic and atraumatic.  Right Ear: External ear normal.  Left Ear: External ear normal.  Mouth/Throat: Oropharynx is clear and moist.  Eyes: Pupils are equal, round, and reactive to light.  Neck: Normal range of motion. Neck supple.  Cardiovascular: Normal rate, regular rhythm, normal heart sounds and intact distal pulses.  Exam reveals no gallop and no friction rub.   No murmur heard. Pulmonary/Chest: Effort normal and breath sounds normal. No respiratory distress. He has no wheezes. He has no rales.  Abdominal: Soft. There is no tenderness. There is no rebound and no guarding.    Musculoskeletal: Normal range of motion. He exhibits no edema and no tenderness.  Lymphadenopathy:    He has no cervical adenopathy.  Neurological: He is alert and oriented to person, place, and time.  Skin: Skin is warm and dry. No rash noted. No erythema.  Psychiatric: He has a normal mood and affect. His behavior is normal.    ED Course  Procedures (including critical care time)  Results for orders placed during the hospital encounter of 07/03/11  URINALYSIS, ROUTINE W REFLEX MICROSCOPIC      Component Value Range   Color, Urine YELLOW  YELLOW    APPearance CLEAR  CLEAR    Specific Gravity, Urine 1.030  1.005 - 1.030    pH 5.5  5.0 - 8.0    Glucose, UA >1000 (*) NEGATIVE (mg/dL)   Hgb urine dipstick NEGATIVE  NEGATIVE    Bilirubin Urine NEGATIVE  NEGATIVE    Ketones, ur NEGATIVE  NEGATIVE (mg/dL)   Protein, ur NEGATIVE  NEGATIVE (mg/dL)   Urobilinogen, UA 0.2  0.0 - 1.0 (mg/dL)   Nitrite NEGATIVE  NEGATIVE    Leukocytes, UA NEGATIVE  NEGATIVE   GLUCOSE, CAPILLARY      Component Value Range   Glucose-Capillary >600 (*) 70 - 99 (mg/dL)  URINE MICROSCOPIC-ADD ON      Component Value Range   Squamous Epithelial / LPF RARE  RARE   BASIC METABOLIC PANEL      Component Value Range   Sodium 125 (*) 135 - 145 (mEq/L)   Potassium 4.4  3.5 - 5.1 (mEq/L)   Chloride 90 (*) 96 - 112 (mEq/L)   CO2 24  19 - 32 (mEq/L)   Glucose, Bld 704 (*) 70 - 99 (mg/dL)   BUN 11  6 - 23 (mg/dL)   Creatinine, Ser 1.61  0.50 - 1.35 (mg/dL)   Calcium 09.6  8.4 - 10.5 (mg/dL)   GFR calc non Af Amer 86 (*) >90 (mL/min)   GFR calc Af Amer >90  >90 (mL/min)  CBC      Component Value Range   WBC 5.5  4.0 - 10.5 (K/uL)   RBC 5.42  4.22 - 5.81 (MIL/uL)   Hemoglobin 16.2  13.0 - 17.0 (g/dL)   HCT 04.5  40.9 - 81.1 (%)   MCV 81.0  78.0 - 100.0 (fL)   MCH 29.9  26.0 - 34.0 (pg)   MCHC 36.9 (*) 30.0 - 36.0 (g/dL)   RDW 91.4  78.2 - 95.6 (%)   Platelets 252  150 - 400 (K/uL)  DIFFERENTIAL       Component Value Range   Neutrophils Relative 52  43 - 77 (%)   Neutro Abs 2.9  1.7 - 7.7 (K/uL)   Lymphocytes Relative 39  12 - 46 (%)   Lymphs Abs 2.1  0.7 - 4.0 (K/uL)   Monocytes Relative 7  3 - 12 (%)   Monocytes Absolute 0.4  0.1 - 1.0 (K/uL)  Eosinophils Relative 1  0 - 5 (%)   Eosinophils Absolute 0.1  0.0 - 0.7 (K/uL)   Basophils Relative 0  0 - 1 (%)   Basophils Absolute 0.0  0.0 - 0.1 (K/uL)  GLUCOSE, CAPILLARY      Component Value Range   Glucose-Capillary 247 (*) 70 - 99 (mg/dL)   Comment 1 Notify RN     Comment 2 Documented in Chart                           Dg Chest 2 View  07/03/2011  *RADIOLOGY REPORT*  Clinical Data: Cough.  Weakness.  CHEST - 2 VIEW  Comparison: Thoracic spine radiographs dated 08/20/2004  Findings: Heart size and vascularity are normal and the lungs are clear.  No acute osseous abnormality.  IMPRESSION: No acute disease.  Original Report Authenticated By: Gwynn Burly, M.D.   Imaging and reviewed by me, interpreted by radiologist.  Clinical impression:  1.   2.   3. Hyperglycemia   4. Cough   5. Polyuria   6. Polydipsia       MDM  68:74 PM a 51 year old male with history of hypertension and depression presenting with a week and a half to 2 week history of generalized weakness and fatigue, polyuria and polydipsia. He he states that he is urinating roughly every 30 minutes and has been drinking fluids constantly with constant dry mouth. He endorses a transient episode of blurry vision a couple of days ago that has since resolved. He denies any chest pain, dyspnea, abdominal pain, nausea, vomiting or diarrhea. He has no personal history of diabetes but does have a family history of diabetes. He has glucose in his urine and elevated blood sugar which is likely explaining his symptoms. We'll check a CBC, BMP and hydrate with fluids.   Recheck of patient's blood sugar after IV fluids revealed that it is still over 500. Glucose stabilizer  started. Patient is not in DKA. He has remained stable. Symptoms are consistent with new-onset diabetes. Will send patient to CDU on hyperglycemia protocol with plans to continue to monitor blood sugar levels. Once blood sugars are stabilized we'll plan to discharge on metformin with close outpatient followup. Patient was understanding of this and was sent to CDU for further management.      Sheran Luz, MD 07/04/11 [phone]

## 2011-07-03 NOTE — ED Notes (Signed)
CBG Result = 527

## 2011-07-03 NOTE — ED Notes (Signed)
CBG Result = 287

## 2011-07-03 NOTE — ED Notes (Signed)
CBG Result = 411

## 2011-07-03 NOTE — Discharge Instructions (Signed)
Please read the information below and the book given to you in the ER.  Call [facility] tomorrow and please let them know you have been diagnosed with diabetes and have had a very high blood sugar (704) and need to be seen more quickly than your March 18 appointment.  If you are unable to change your appointment, please go to the [facility] urgent care or urgent care of your choice to have your blood sugar rechecked in 1 week.  Take the metformin twice daily.  This may be a medicine you will need to continue for the rest of your life.  Make sure that you keep current on your refills.  Your doctor may also need to change or add to this medication to control your blood sugar.  Please return to the Emergency Department if you begin to feel sick again, have abdominal or chest pain,nausea and vomiting, or any new symptoms that concern you.     Diabetes, Type 2 Diabetes is a long-lasting (chronic) disease. In type 2 diabetes, the pancreas does not make enough insulin (a hormone), and the body does not respond normally to the insulin that is made. This type of diabetes was also previously called adult-onset diabetes. It usually occurs after the age of 60, but it can occur at any age.  CAUSES  Type 2 diabetes happens because the pancreasis not making enough insulin or your body has trouble using the insulin that your pancreas does make properly. SYMPTOMS   Drinking more than usual.   Urinating more than usual.   Blurred vision.   Dry, itchy skin.   Frequent infections.   Feeling more tired than usual (fatigue).  DIAGNOSIS The diagnosis of type 2 diabetes is usually made by one of the following tests:  Fasting blood glucose test. You will not eat for at least 8 hours and then take a blood test.   Random blood glucose test. Your blood glucose (sugar) is checked at any time of the day regardless of when you ate.   Oral glucose tolerance test (OGTT). Your blood glucose is measured after you have  not eaten (fasted) and then after you drink a glucose containing beverage.  TREATMENT   Healthy eating.   Exercise.   Medicine, if needed.   Monitoring blood glucose.   Seeing your caregiver regularly.  HOME CARE INSTRUCTIONS   Check your blood glucose at least once a day. More frequent monitoring may be necessary, depending on your medicines and on how well your diabetes is controlled. Your caregiver will advise you.   Take your medicine as directed by your caregiver.   Do not smoke.   Make wise food choices. Ask your caregiver for information. Weight loss can improve your diabetes.   Learn about low blood glucose (hypoglycemia) and how to treat it.   Get your eyes checked regularly.   Have a yearly physical exam. Have your blood pressure checked and your blood and urine tested.   Wear a pendant or bracelet saying that you have diabetes.   Check your feet every night for cuts, sores, blisters, and redness. Let your caregiver know if you have any problems.  SEEK MEDICAL CARE IF:   You have problems keeping your blood glucose in target range.   You have problems with your medicines.   You have symptoms of an illness that do not improve after 24 hours.   You have a sore or wound that is not healing.   You notice a  change in vision or a new problem with your vision.   You have a fever.  MAKE SURE YOU:  Understand these instructions.   Will watch your condition.   Will get help right away if you are not doing well or get worse.  Document Released: 04/21/2005 Document Revised: 01/02/2011 Document Reviewed: 10/07/2010 [facility] Patient Information 2012 [place], [place].Diabetes, Keeping Your Heart and Blood Vessels Healthy Too much glucose (sugar) in the blood for a long period of time can cause problems for those who have diabetes. This high blood glucose can damage many parts of the body, such as the heart, blood vessels, eyes, and kidneys. Heart and blood vessel  disease can lead to heart attacks and strokes, which is the leading cause of death for people with diabetes. There are many things you can to do slow down or prevent the complications from diabetes. WHAT DO MY HEART AND BLOOD VESSELS DO? Your heart and blood vessels make up your circulatory system. Your heart is a big muscle that pumps blood through your body. Your heart pumps blood carrying oxygen to large blood vessels (arteries) and small blood vessels (capillaries). Other blood vessels, called veins, carry blood back to the heart. HOW DO MY BLOOD VESSELS GET CLOGGED? Several things, including having diabetes, can make your blood cholesterol level too high. Cholesterol is a substance that is made by the body and used for many important functions. It is also found in some food that comes from animals. When cholesterol is too high, the insides of large blood vessels become narrowed, even clogged. Narrowed and clogged blood vessels make it harder for enough blood to get to all parts of your body. This problem is called atherosclerosis. WHAT CAN HAPPEN WHEN BLOOD VESSELS ARE CLOGGED? When arteries become narrowed and clogged, you may have heart problems and are at increased risk for heart attack and stroke:  Chest pain (angina) causes pain in your chest, arms, shoulders, or back. You may feel the pain more when your heart beats faster, such as when you exercise. The pain may go away when you rest. You also may feel very weak and sweaty. If you do not get treatment, chest pain may happen more often. If diabetes has damaged the heart nerves, you may not feel the chest pain.   Heart attack. A heart attack happens when a blood vessel in or near the heart becomes blocked. Not enough blood can get to that part of the heart muscle so the area becomes oxygen deprived and the heart muscle may be permanently damaged.  WHAT ARE THE WARNING SIGNS OF A HEART ATTACK? You may have one or more of the following warning  signs:  Chest pain or discomfort.   Pain or discomfort in your arms, back, jaw, or neck.   Indigestion or stomach pain.   Shortness of breath.   Sweating.   Nausea or vomiting.   Light-headedness.   You may have no warning signs at all, or they may come and go.  HOW DOES HEART DISEASE CAUSE HIGH BLOOD PRESSURE? Narrowed blood vessels leave a smaller opening for blood to flow through. It is like turning on a garden hose and holding your thumb over the opening. The smaller opening makes the water shoot out with more pressure. In the same way, narrowed blood vessels lead to high blood pressure. Other factors, such as kidney problems and being overweight, can also lead to high blood pressure. Many people with diabetes also have high blood pressure. If  you have heart, eye, or kidney problems from diabetes, high blood pressure can make them worse. If you have high blood pressure, ask your caregiver how to lower it. Your caregiver may be asked to take blood pressure medicine every day. Some types of blood pressure medicine can also help keep your kidneys healthy. To lower your blood pressure, your may be asked to lose weight; eat more fruits and vegetables; eat less salt and high-sodium foods, such as canned soups, luncheon meats, salty snack foods, and fast foods; and drink less alcohol. WHAT ARE THE WARNING SIGNS OF A STROKE? A stroke happens when part of your brain is not getting enough blood and stops working. Depending on the part of the brain that is damaged, a stroke can cause:  Sudden weakness or numbness of your face, arm, or leg on one side of your body.   Sudden confusion, trouble talking, or trouble understanding.   Sudden dizziness, loss of balance, or trouble walking.   Sudden trouble seeing in one or both eyes or sudden double vision.   Sudden severe headache.  Sometimes, one or more of these warning signs may happen and then disappear. You might be having a "mini-stroke,"  also called a TIA (transient ischemic attack). If you have any of these warning signs, tell your caregiver right away. HOW CAN CLOGGED BLOOD VESSELS HURT MY LEGS AND FEET? Peripheral vascular disease can happen when the openings in your blood vessels become narrow and not enough blood gets to your legs and feet. You may feel pain in your buttocks, the back of your legs, or your thighs when you stand, walk, or exercise. Sometimes, surgery is necessary to treat this problem. WHAT CAN I DO TO KEEP MY BLOOD VESSELS HEALTHY AND PREVENT HEART DISEASE AND STROKE?  Keep your blood glucose under control. An A1c blood test will probably be ordered by your caregiver at least twice a year. The A1c test tells you your average blood glucose for the past 2 to 3 months and can give valuable information on the overall control of your diabetes.   Keep your blood pressure under control. Have it checked at every health care visit. If you are on medication to control blood pressure, take it exactly as prescribed. The target for most people is below 130/80.   Keep your cholesterol under control. Have it checked at least once a year. The targets for most people are:   LDL (bad) cholesterol: below 100 mg/dl.   HDL (good) cholesterol: above 40 mg/dl in men and above 50 mg/dl in women.   Triglycerides (another type of fat in the blood): below 150 mg/dl.   Make physical activity a part of your daily routine. Aim for at least 30 minutes of exercise most days of the week. Check with your caregiver to learn what activities are best for you.   Make sure that the foods you eat are "heart-healthy." Include foods high in fiber, such as oat bran, oatmeal, whole-grain breads and cereals, fruits, and vegetables. Cut back on foods high in saturated fat or cholesterol, such as meats, butter, dairy products with fat, eggs, shortening, lard, and foods with palm oil or coconut oil.   Maintain a healthy weight. If you are overweight, try  to exercise most days of the week. See a registered dietitian for help in planning meals and lowering the fat and calorie content.   If you smoke, QUIT. Your caregiver can tell you about ways to help you quit smoking.  Ask your caregiver whether you should take an aspirin every day. Studies have shown that taking a low dose of aspirin every day can help reduce your risk of heart disease and stroke.   Take your medicines as directed.  SEEK MEDICAL CARE IF:   You have any of the warning signs of a heart attack or stroke.   If you have pain in your legs or feet when walking.   If your feet and legs are cool or cold to the touch.  FOR MORE INFORMATION   Diabetes educators (nurses, dietitians, pharmacists, and other health professionals).   To find a diabetes educator near you, call the American Association of Diabetes Educators (AADE) toll-free at 1-800-TEAMUP4 337-662-1032), or look on the Internet at www.diabeteseducator.org and click on "Find a Diabetes Educator."   Dietitians.   To find a dietitian near you, call the American Dietetic Association toll-free at [phone], or look on the Internet at www.eatright.org and click on "Find a Nutrition Professional."   Government.   The National Heart, Lung, and Blood Institute (NHLBI) is part of the [employer]. To learn more about heart and blood vessel problems, write or call NHLBI Information Center, [id number], [place] [address], [phone]; or see PopSteam.is on the Internet.   To get more information about taking care of diabetes, contact:  National Diabetes Information Clearinghouse [address] [place], [place] [address] Phone: [phone] or [phone] Fax: [phone] Email: ndic@info .StageSync.si Internet: www.diabetes.StageSync.si National Diabetes Education Program [address] [place], [place] [address] Phone: [phone] Fax: [phone] Internet:  CommunicationFinder.com.au American Diabetes Association [address] [place], [place] [address] Phone: [phone] Internet: www.diabetes.org [employer] [employer] [facility] [address] [place], [place] [address] Phone: [phone] Internet: www.jdrf.org Document Released: 04/24/2003 Document Revised: 01/01/2011 Document Reviewed: 09/29/2008 [facility] Patient Information 2012 [place], [place].Diabetes Meal Planning Guide The diabetes meal planning guide is a tool to help you plan your meals and snacks. It is important for people with diabetes to manage their blood glucose (sugar) levels. Choosing the right foods and the right amounts throughout your day will help control your blood glucose. Eating right can even help you improve your blood pressure and reach or maintain a healthy weight. CARBOHYDRATE COUNTING MADE EASY When you eat carbohydrates, they turn to sugar. This raises your blood glucose level. Counting carbohydrates can help you control this level so you feel better. When you plan your meals by counting carbohydrates, you can have more flexibility in what you eat and balance your medicine with your food intake. Carbohydrate counting simply means adding up the total amount of carbohydrate grams in your meals and snacks. Try to eat about the same amount at each meal. Foods with carbohydrates are listed below. Each portion below is 1 carbohydrate serving or 15 grams of carbohydrates. Ask your dietician how many grams of carbohydrates you should eat at each meal or snack. Grains and Starches  1 slice bread.    English muffin or hotdog/hamburger bun.    cup cold cereal (unsweetened).   ? cup cooked pasta or rice.    cup starchy vegetables (corn, potatoes, peas, beans, winter squash).   1 tortilla (6 inches).    bagel.   1 waffle or pancake (size of a CD).    cup cooked cereal.   4 to 6 small crackers.  *Whole grain is recommended. Fruit  1 cup  fresh unsweetened berries, melon, papaya, pineapple.   1 small fresh fruit.  banana or mango.    cup fruit juice (4 oz unsweetened).    cup canned fruit in natural juice or water.   2 tbs dried fruit.   12 to 15 grapes or cherries.  Milk and Yogurt  1 cup fat-free or 1% milk.   1 cup soy milk.   6 oz light yogurt with sugar-free sweetener.   6 oz low-fat soy yogurt.   6 oz plain yogurt.  Vegetables  1 cup raw or  cup cooked is counted as 0 carbohydrates or a "free" food.   If you eat 3 or more servings at 1 meal, count them as 1 carbohydrate serving.  Other Carbohydrates   oz chips or pretzels.    cup ice cream or frozen yogurt.    cup sherbet or sorbet.   2 inch square cake, no frosting.   1 tbs honey, sugar, jam, jelly, or syrup.   2 small cookies.   3 squares of graham crackers.   3 cups popcorn.   6 crackers.   1 cup broth-based soup.   Count 1 cup casserole or other mixed foods as 2 carbohydrate servings.   Foods with less than 20 calories in a serving may be counted as 0 carbohydrates or a "free" food.  You may want to purchase a book or computer software that lists the carbohydrate gram counts of different foods. In addition, the nutrition facts panel on the labels of the foods you eat are a good source of this information. The label will tell you how big the serving size is and the total number of carbohydrate grams you will be eating per serving. Divide this number by 15 to obtain the number of carbohydrate servings in a portion. Remember, 1 carbohydrate serving equals 15 grams of carbohydrate. SERVING SIZES Measuring foods and serving sizes helps you make sure you are getting the right amount of food. The list below tells how big or small some common serving sizes are.  1 oz.........4 stacked dice.   3 oz........Marland KitchenDeck of cards.   1 tsp.......Marland KitchenTip of little finger.   1 tbs......Marland KitchenMarland KitchenThumb.   2 tbs.......Marland KitchenGolf ball.    cup......Marland KitchenHalf  of a fist.   1 cup.......Marland KitchenA fist.  SAMPLE DIABETES MEAL PLAN Below is a sample meal plan that includes foods from the grain and starches, dairy, vegetable, fruit, and meat groups. A dietician can individualize a meal plan to fit your calorie needs and tell you the number of servings needed from each food group. However, controlling the total amount of carbohydrates in your meal or snack is more important than making sure you include all of the food groups at every meal. You may interchange carbohydrate containing foods (dairy, starches, and fruits). The meal plan below is an example of a 2000 calorie diet using carbohydrate counting. This meal plan has 17 carbohydrate servings. Breakfast  1 cup oatmeal (2 carb servings).    cup light yogurt (1 carb serving).   1 cup blueberries (1 carb serving).    cup almonds.  Snack  1 large apple (2 carb servings).   1 low-fat string cheese stick.  Lunch  Chicken breast salad.   1 cup spinach.    cup chopped tomatoes.   2 oz chicken breast, sliced.   2 tbs low-fat [place] dressing.   12 whole-wheat crackers (2 carb servings).   12 to 15 grapes (1 carb serving).   1 cup low-fat milk (1 carb serving).  Snack  1 cup carrots.  cup hummus (1 carb serving).  Dinner  3 oz broiled salmon.   1 cup brown rice (3 carb servings).  Snack  1  cups steamed broccoli (1 carb serving) drizzled with 1 tsp olive oil and lemon juice.   1 cup light pudding (2 carb servings).  DIABETES MEAL PLANNING WORKSHEET Your dietician can use this worksheet to help you decide how many servings of foods and what types of foods are right for you.  BREAKFAST Food Group and Servings / Carb Servings Grain/Starches __________________________________ Dairy __________________________________________ Vegetable ______________________________________ Fruit ___________________________________________ Meat __________________________________________ Fat  ____________________________________________ LUNCH Food Group and Servings / Carb Servings Grain/Starches ___________________________________ Dairy ___________________________________________ Fruit ____________________________________________ Meat ___________________________________________ Fat _____________________________________________ Laural Golden Food Group and Servings / Carb Servings Grain/Starches ___________________________________ Dairy ___________________________________________ Fruit ____________________________________________ Meat ___________________________________________ Fat _____________________________________________ SNACKS Food Group and Servings / Carb Servings Grain/Starches ___________________________________ Dairy ___________________________________________ Vegetable _______________________________________ Fruit ____________________________________________ Meat ___________________________________________ Fat _____________________________________________ DAILY TOTALS Starches _________________________ Vegetable ________________________ Fruit ____________________________ Dairy ____________________________ Meat ____________________________ Fat ______________________________ Document Released: 01/16/2005 Document Revised: 01/01/2011 Document Reviewed: 11/27/2008 [employer] Patient Information 2012 [place], [place].Bronchitis Bronchitis is the body's way of reacting to injury and/or infection (inflammation) of the bronchi. Bronchi are the air tubes that extend from the windpipe into the lungs. If the inflammation becomes severe, it may cause shortness of breath. CAUSES  Inflammation may be caused by:  A virus.   Germs (bacteria).   Dust.   Allergens.   Pollutants and many other irritants.  The cells lining the bronchial tree are covered with tiny hairs (cilia). These constantly beat upward, away from the lungs, toward the mouth. This keeps the lungs free of  pollutants. When these cells become too irritated and are unable to do their job, mucus begins to develop. This causes the characteristic cough of bronchitis. The cough clears the lungs when the cilia are unable to do their job. Without either of these protective mechanisms, the mucus would settle in the lungs. Then you would develop pneumonia. Smoking is a common cause of bronchitis and can contribute to pneumonia. Stopping this habit is the single most important thing you can do to help yourself. TREATMENT   Your caregiver may prescribe an antibiotic if the cough is caused by bacteria. Also, medicines that open up your airways make it easier to breathe. Your caregiver may also recommend or prescribe an expectorant. It will loosen the mucus to be coughed up. Only take over-the-counter or prescription medicines for pain, discomfort, or fever as directed by your caregiver.   Removing whatever causes the problem (smoking, for example) is critical to preventing the problem from getting worse.   Cough suppressants may be prescribed for relief of cough symptoms.   Inhaled medicines may be prescribed to help with symptoms now and to help prevent problems from returning.   For those with recurrent (chronic) bronchitis, there may be a need for steroid medicines.  SEEK IMMEDIATE MEDICAL CARE IF:   During treatment, you develop more pus-like mucus (purulent sputum).   You have a fever.   Your baby is older than 3 months with a rectal temperature of 102 F (38.9 C) or higher.   Your baby is 80 months old or younger with a rectal temperature of 100.4 F (38 C) or higher.   You become progressively more ill.   You have increased difficulty breathing, wheezing, or shortness of breath.  It is necessary to seek immediate medical care if you are elderly or sick from any other disease. MAKE SURE YOU:  Understand these instructions.   Will watch your condition.   Will get help right away if you are  not doing well or get worse.  Document Released: 04/21/2005 Document Revised: 01/01/2011 Document Reviewed: 02/29/2008 [facility] Patient Information 2012 [place], [place].Bronchitis Bronchitis is the body's way of reacting to injury and/or infection (inflammation) of the bronchi. Bronchi are the air tubes that extend from the windpipe into the lungs. If the inflammation becomes severe, it may cause shortness of breath. CAUSES  Inflammation may be caused by:  A virus.   Germs (bacteria).   Dust.   Allergens.   Pollutants and many other irritants.  The cells lining the bronchial tree are covered with tiny hairs (cilia). These constantly beat upward, away from the lungs, toward the mouth. This keeps the lungs free of pollutants. When these cells become too irritated and are unable to do their job, mucus begins to develop. This causes the characteristic cough of bronchitis. The cough clears the lungs when the cilia are unable to do their job. Without either of these protective mechanisms, the mucus would settle in the lungs. Then you would develop pneumonia. Smoking is a common cause of bronchitis and can contribute to pneumonia. Stopping this habit is the single most important thing you can do to help yourself. TREATMENT   Your caregiver may prescribe an antibiotic if the cough is caused by bacteria. Also, medicines that open up your airways make it easier to breathe. Your caregiver may also recommend or prescribe an expectorant. It will loosen the mucus to be coughed up. Only take over-the-counter or prescription medicines for pain, discomfort, or fever as directed by your caregiver.   Removing whatever causes the problem (smoking, for example) is critical to preventing the problem from getting worse.   Cough suppressants may be prescribed for relief of cough symptoms.   Inhaled medicines may be prescribed to help with symptoms now and to help prevent problems from returning.   For those with  recurrent (chronic) bronchitis, there may be a need for steroid medicines.  SEEK IMMEDIATE MEDICAL CARE IF:   During treatment, you develop more pus-like mucus (purulent sputum).   You have a fever.   Your baby is older than 3 months with a rectal temperature of 102 F (38.9 C) or higher.   Your baby is 80 months old or younger with a rectal temperature of 100.4 F (38 C) or higher.   You become progressively more ill.   You have increased difficulty breathing, wheezing, or shortness of breath.  It is necessary to seek immediate medical care if you are elderly or sick from any other disease. MAKE SURE YOU:   Understand these instructions.   Will watch your condition.   Will get help right away if you are not doing well or get worse.  Document Released: 04/21/2005 Document Revised: 01/01/2011 Document Reviewed: 02/29/2008 [facility] Patient Information 2012 [place], [place].

## 2011-07-03 NOTE — ED Notes (Signed)
Glucostabilizer activated.  Pt to receive 3.3units/hr of insulin per stabilizer controls.

## 2011-07-04 LAB — GLUCOSE, CAPILLARY
Glucose-Capillary: 527 mg/dL — ABNORMAL HIGH (ref 70–99)
Glucose-Capillary: 387 mg/dL — ABNORMAL HIGH (ref 70–99)
Glucose-Capillary: 411 mg/dL — ABNORMAL HIGH (ref 70–99)

## 2011-07-04 NOTE — ED Provider Notes (Signed)
Medical screening examination/treatment/procedure(s) were conducted as a shared visit with non-physician practitioner(s) and myself.  I personally evaluated the patient during the encounter  Please see my prior note.  Gavin Pound. Oletta Lamas, MD 07/04/11 9604

## 2011-07-06 NOTE — ED Provider Notes (Signed)
Please see my prior note for attestation statement  Gavin Pound. Oletta Lamas, MD 07/06/11 2128

## 2011-11-12 ENCOUNTER — Emergency Department (HOSPITAL_COMMUNITY)
Admission: EM | Admit: 2011-11-12 | Discharge: 2011-11-12 | Disposition: A | Discharge status: Home / Self Care | Payer: Medicaid Other | Attending: Emergency Medicine | Admitting: Emergency Medicine

## 2011-11-12 ENCOUNTER — Encounter (HOSPITAL_COMMUNITY): Payer: Self-pay | Admitting: Emergency Medicine

## 2011-11-12 ENCOUNTER — Emergency Department (HOSPITAL_COMMUNITY): Payer: Medicaid Other

## 2011-11-12 DIAGNOSIS — I10 Essential (primary) hypertension: Secondary | ICD-10-CM | POA: Insufficient documentation

## 2011-11-12 DIAGNOSIS — S1093XA Contusion of unspecified part of neck, initial encounter: Secondary | ICD-10-CM | POA: Insufficient documentation

## 2011-11-12 DIAGNOSIS — F10929 Alcohol use, unspecified with intoxication, unspecified: Secondary | ICD-10-CM

## 2011-11-12 DIAGNOSIS — S0003XA Contusion of scalp, initial encounter: Secondary | ICD-10-CM | POA: Insufficient documentation

## 2011-11-12 DIAGNOSIS — F1010 Alcohol abuse, uncomplicated: Secondary | ICD-10-CM | POA: Insufficient documentation

## 2011-11-12 DIAGNOSIS — F17200 Nicotine dependence, unspecified, uncomplicated: Secondary | ICD-10-CM | POA: Insufficient documentation

## 2011-11-12 DIAGNOSIS — S0083XA Contusion of other part of head, initial encounter: Secondary | ICD-10-CM

## 2011-11-12 NOTE — ED Notes (Signed)
Patient refusing all treatment. Patient refusing to stay. RN at bedside completing AMA forms. Security at bedside per request by RN

## 2011-11-12 NOTE — ED Notes (Signed)
MD at bedside and states that "the only thing in my piss is my pain medications" - patient is very upset that he is here at the hospital and the he has received "no care". The patient verbally abusive to the MD who is at the bedside. The patient
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
## 2011-11-12 NOTE — ED Notes (Signed)
Patient escorted out with security at this time.

## 2011-11-12 NOTE — ED Notes (Signed)
Patient refused blood draw for labs.RN Enzo Montgomery made aware

## 2011-11-12 NOTE — ED Notes (Signed)
Per MD Lynelle Doctor, patient is refusing care. Spoke with patient and wife she is agreeable to take the patient home. Patient refusing to sign the AMA paper work. Patient is advised of his rights and walked to the restroom. Patient is upset that he has not received "any care"
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
## 2011-11-12 NOTE — ED Notes (Signed)
Patient refused to be changed into gown. Rn notified

## 2012-09-12 ENCOUNTER — Emergency Department (HOSPITAL_COMMUNITY): Payer: Medicaid Other

## 2012-09-12 ENCOUNTER — Encounter (HOSPITAL_COMMUNITY): Payer: Self-pay | Admitting: *Deleted

## 2012-09-12 ENCOUNTER — Observation Stay (HOSPITAL_COMMUNITY)
Admission: EM | Admit: 2012-09-12 | Discharge: 2012-09-14 | Disposition: A | Discharge status: Home / Self Care | Payer: Medicaid Other | Attending: Cardiovascular Disease | Admitting: Cardiovascular Disease

## 2012-09-12 DIAGNOSIS — N400 Enlarged prostate without lower urinary tract symptoms: Secondary | ICD-10-CM | POA: Insufficient documentation

## 2012-09-12 DIAGNOSIS — M1990 Unspecified osteoarthritis, unspecified site: Secondary | ICD-10-CM | POA: Diagnosis present

## 2012-09-12 DIAGNOSIS — Z79899 Other long term (current) drug therapy: Secondary | ICD-10-CM | POA: Insufficient documentation

## 2012-09-12 DIAGNOSIS — I10 Essential (primary) hypertension: Secondary | ICD-10-CM | POA: Diagnosis present

## 2012-09-12 DIAGNOSIS — F17200 Nicotine dependence, unspecified, uncomplicated: Secondary | ICD-10-CM | POA: Diagnosis present

## 2012-09-12 DIAGNOSIS — F3289 Other specified depressive episodes: Secondary | ICD-10-CM | POA: Insufficient documentation

## 2012-09-12 DIAGNOSIS — M479 Spondylosis, unspecified: Secondary | ICD-10-CM | POA: Insufficient documentation

## 2012-09-12 DIAGNOSIS — J984 Other disorders of lung: Secondary | ICD-10-CM | POA: Insufficient documentation

## 2012-09-12 DIAGNOSIS — Z794 Long term (current) use of insulin: Secondary | ICD-10-CM | POA: Insufficient documentation

## 2012-09-12 DIAGNOSIS — Z9889 Other specified postprocedural states: Secondary | ICD-10-CM

## 2012-09-12 DIAGNOSIS — Z23 Encounter for immunization: Secondary | ICD-10-CM | POA: Insufficient documentation

## 2012-09-12 DIAGNOSIS — E7800 Pure hypercholesterolemia, unspecified: Secondary | ICD-10-CM | POA: Insufficient documentation

## 2012-09-12 DIAGNOSIS — E785 Hyperlipidemia, unspecified: Secondary | ICD-10-CM

## 2012-09-12 DIAGNOSIS — F319 Bipolar disorder, unspecified: Secondary | ICD-10-CM | POA: Diagnosis present

## 2012-09-12 DIAGNOSIS — I2510 Atherosclerotic heart disease of native coronary artery without angina pectoris: Secondary | ICD-10-CM | POA: Insufficient documentation

## 2012-09-12 DIAGNOSIS — F329 Major depressive disorder, single episode, unspecified: Secondary | ICD-10-CM | POA: Insufficient documentation

## 2012-09-12 DIAGNOSIS — F32A Depression, unspecified: Secondary | ICD-10-CM | POA: Diagnosis present

## 2012-09-12 DIAGNOSIS — I200 Unstable angina: Secondary | ICD-10-CM

## 2012-09-12 DIAGNOSIS — E119 Type 2 diabetes mellitus without complications: Secondary | ICD-10-CM

## 2012-09-12 DIAGNOSIS — R0602 Shortness of breath: Secondary | ICD-10-CM | POA: Insufficient documentation

## 2012-09-12 DIAGNOSIS — I079 Rheumatic tricuspid valve disease, unspecified: Secondary | ICD-10-CM | POA: Insufficient documentation

## 2012-09-12 DIAGNOSIS — R079 Chest pain, unspecified: Secondary | ICD-10-CM

## 2012-09-12 DIAGNOSIS — R42 Dizziness and giddiness: Secondary | ICD-10-CM | POA: Insufficient documentation

## 2012-09-12 HISTORY — DX: Unspecified osteoarthritis, unspecified site: M19.90

## 2012-09-12 HISTORY — DX: Type 2 diabetes mellitus without complications: E11.9

## 2012-09-12 HISTORY — DX: Nicotine dependence, unspecified, uncomplicated: F17.200

## 2012-09-12 HISTORY — DX: Other specified postprocedural states: Z98.890

## 2012-09-12 HISTORY — DX: Hyperlipidemia, unspecified: E78.5

## 2012-09-12 HISTORY — DX: Essential (primary) hypertension: I10

## 2012-09-12 HISTORY — DX: Unstable angina: I20.0

## 2012-09-12 HISTORY — DX: Benign prostatic hyperplasia without lower urinary tract symptoms: N40.0

## 2012-09-12 LAB — COMPREHENSIVE METABOLIC PANEL
Albumin: 4 g/dL (ref 3.5–5.2)
Alkaline Phosphatase: 57 U/L (ref 39–117)
BUN: 11 mg/dL (ref 6–23)
CO2: 25 mEq/L (ref 19–32)
Chloride: 101 mEq/L (ref 96–112)
Creatinine, Ser: 1 mg/dL (ref 0.50–1.35)
GFR calc Af Amer: >90 mL/min (ref >90)
GFR calc non Af Amer: 85 mL/min — ABNORMAL LOW (ref >90)
Glucose, Bld: 163 mg/dL — ABNORMAL HIGH (ref 70–99)
Potassium: 3.8 mEq/L (ref 3.5–5.1)
Total Bilirubin: 0.5 mg/dL (ref 0.3–1.2)

## 2012-09-12 LAB — GLUCOSE, CAPILLARY
Glucose-Capillary: 181 mg/dL — ABNORMAL HIGH (ref 70–99)
Glucose-Capillary: 151 mg/dL — ABNORMAL HIGH (ref 70–99)

## 2012-09-12 LAB — CBC WITH DIFFERENTIAL/PLATELET
HCT: 44.2 % (ref 39.0–52.0)
Hemoglobin: 16.1 g/dL (ref 13.0–17.0)
Lymphocytes Relative: 51 % — ABNORMAL HIGH (ref 12–46)
Lymphs Abs: 2.6 10*3/uL (ref 0.7–4.0)
Monocytes Absolute: 0.5 10*3/uL (ref 0.1–1.0)
Monocytes Relative: 9 % (ref 3–12)
Neutro Abs: 2 10*3/uL (ref 1.7–7.7)
Neutrophils Relative %: 39 % — ABNORMAL LOW (ref 43–77)
RBC: 5.25 MIL/uL (ref 4.22–5.81)

## 2012-09-12 LAB — TSH: TSH: 1.581 u[IU]/mL (ref 0.350–4.500)

## 2012-09-12 LAB — HEMOGLOBIN A1C: Mean Plasma Glucose: 183 mg/dL — ABNORMAL HIGH (ref <117)

## 2012-09-12 LAB — POCT I-STAT TROPONIN I: Troponin i, poc: 0 ng/mL (ref 0.00–0.08)

## 2012-09-12 MED ORDER — ASPIRIN EC 81 MG PO TBEC
81.0000 mg | DELAYED_RELEASE_TABLET | Freq: Every day | ORAL | Status: DC
  Filled 2012-09-12 (×3): qty 1

## 2012-09-12 MED ORDER — ALBUTEROL SULFATE HFA 108 (90 BASE) MCG/ACT IN AERS: 2.0000 | INHALATION_SPRAY | Freq: Four times a day (QID) | RESPIRATORY_TRACT | Status: DC | PRN

## 2012-09-12 MED ORDER — ENOXAPARIN SODIUM 60 MG/0.6ML ~~LOC~~ SOLN
50.0000 mg | SUBCUTANEOUS | Status: DC
  Administered 2012-09-12 – 2012-09-13 (×2): 50 mg via SUBCUTANEOUS
  Filled 2012-09-12 (×3): qty 0.6

## 2012-09-12 MED ORDER — SODIUM CHLORIDE 0.9 % IJ SOLN
3.0000 mL | Freq: Two times a day (BID) | INTRAMUSCULAR | Status: DC
  Administered 2012-09-12: 3 mL via INTRAVENOUS

## 2012-09-12 MED ORDER — METOPROLOL TARTRATE 12.5 MG HALF TABLET
12.5000 mg | ORAL_TABLET | Freq: Two times a day (BID) | ORAL | Status: DC
  Administered 2012-09-12 – 2012-09-13 (×3): 12.5 mg via ORAL
  Filled 2012-09-12 (×5): qty 1

## 2012-09-12 MED ORDER — TRAMADOL HCL 50 MG PO TABS: 50.0000 mg | ORAL_TABLET | Freq: Four times a day (QID) | ORAL | Status: DC | PRN

## 2012-09-12 MED ORDER — TAMSULOSIN HCL 0.4 MG PO CAPS
0.4000 mg | ORAL_CAPSULE | Freq: Every day | ORAL | Status: DC
  Administered 2012-09-12 – 2012-09-13 (×2): 0.4 mg via ORAL
  Filled 2012-09-12 (×3): qty 1

## 2012-09-12 MED ORDER — VERAPAMIL HCL ER 180 MG PO TBCR
360.0000 mg | EXTENDED_RELEASE_TABLET | Freq: Every day | ORAL | Status: DC
  Administered 2012-09-12: 360 mg via ORAL
  Filled 2012-09-12: qty 2

## 2012-09-12 MED ORDER — ONDANSETRON HCL 4 MG PO TABS: 4.0000 mg | ORAL_TABLET | Freq: Four times a day (QID) | ORAL | Status: DC | PRN

## 2012-09-12 MED ORDER — SODIUM CHLORIDE 0.9 % IV SOLN
INTRAVENOUS | Status: DC
  Administered 2012-09-12: 10:00:00 via INTRAVENOUS

## 2012-09-12 MED ORDER — ATORVASTATIN CALCIUM 40 MG PO TABS
40.0000 mg | ORAL_TABLET | Freq: Every day | ORAL | Status: DC
  Administered 2012-09-13: 40 mg via ORAL
  Filled 2012-09-12 (×2): qty 1

## 2012-09-12 MED ORDER — VERAPAMIL HCL ER 180 MG PO TBCR
360.0000 mg | EXTENDED_RELEASE_TABLET | Freq: Every day | ORAL | Status: DC
  Administered 2012-09-13: 360 mg via ORAL
  Filled 2012-09-12 (×2): qty 2

## 2012-09-12 MED ORDER — LURASIDONE HCL 40 MG PO TABS
40.0000 mg | ORAL_TABLET | Freq: Every day | ORAL | Status: DC
  Administered 2012-09-13 – 2012-09-14 (×2): 40 mg via ORAL
  Filled 2012-09-12 (×3): qty 1

## 2012-09-12 MED ORDER — INSULIN ASPART 100 UNIT/ML ~~LOC~~ SOLN
0.0000 [IU] | Freq: Three times a day (TID) | SUBCUTANEOUS | Status: DC
  Administered 2012-09-12: 18:00:00 via SUBCUTANEOUS
  Administered 2012-09-13 (×2): 3 [IU] via SUBCUTANEOUS
  Administered 2012-09-13 – 2012-09-14 (×2): 2 [IU] via SUBCUTANEOUS

## 2012-09-12 MED ORDER — ALBUTEROL SULFATE (5 MG/ML) 0.5% IN NEBU: 2.5000 mg | INHALATION_SOLUTION | RESPIRATORY_TRACT | Status: DC | PRN

## 2012-09-12 MED ORDER — ONDANSETRON HCL 4 MG/2ML IJ SOLN: 4.0000 mg | Freq: Four times a day (QID) | INTRAMUSCULAR | Status: DC | PRN

## 2012-09-12 MED ORDER — VERAPAMIL HCL ER 360 MG PO CP24: 360.0000 mg | ORAL_CAPSULE | Freq: Every day | ORAL | Status: DC

## 2012-09-12 MED ORDER — ACETAMINOPHEN 650 MG RE SUPP: 650.0000 mg | Freq: Four times a day (QID) | RECTAL | Status: DC | PRN

## 2012-09-12 MED ORDER — HYDROCODONE-ACETAMINOPHEN 10-325 MG PO TABS
1.0000 | ORAL_TABLET | Freq: Four times a day (QID) | ORAL | Status: DC | PRN
  Administered 2012-09-12 – 2012-09-14 (×3): 1 via ORAL
  Filled 2012-09-12 (×3): qty 1

## 2012-09-12 MED ORDER — PNEUMOCOCCAL VAC POLYVALENT 25 MCG/0.5ML IJ INJ
0.5000 mL | INJECTION | INTRAMUSCULAR | Status: AC
  Administered 2012-09-13: 0.5 mL via INTRAMUSCULAR
  Filled 2012-09-12 (×2): qty 0.5

## 2012-09-12 MED ORDER — ESCITALOPRAM OXALATE 20 MG PO TABS
30.0000 mg | ORAL_TABLET | Freq: Every day | ORAL | Status: DC
  Administered 2012-09-13: 30 mg via ORAL
  Filled 2012-09-12 (×2): qty 1

## 2012-09-12 MED ORDER — ACETAMINOPHEN 325 MG PO TABS: 650.0000 mg | ORAL_TABLET | Freq: Four times a day (QID) | ORAL | Status: DC | PRN

## 2012-09-12 NOTE — H&P (Signed)
[facility] History and Physical  DEMPSY DAMIANO ZOX:[id number] DOB: November 12, 1960 DOA: 09/12/2012   PCP: Quitman Livings, MD  Specialists: He has been seen by [facility] in the past for BPH  Chief Complaint: Chest pain since last night  HPI: Eduardo French is a 52 y.o. male with a past medical history of hypertension, diabetes, BPH, bipolar disorder, who was in his usual state of health last night when he was resting and suddenly started experiencing aching sensation in the retrosternal area radiating to the left chest and to his left arm and into his fingers which then felt numb. He decided to sleep through the night and when he woke up this morning he continued to have this pain and so, he decided to come in to the hospital. He has had some dizziness and lightheadedness, but none currently. The pain was 5/10 in intensity and none currently. Has a dry cough. Denies any fever or chills. Has had some shortness of breath in the past, but none currently. Denies any nausea, but admits to poor appetite. When the pain is present it is sometimes aggravated by lifting his arms. No real relieving factors. He took an aspirin with no relief.  He mentioned, that he's been having this pain on and off for the last 3-4 months. Sometimes the pain occurs with exertion, sometimes with anxiety. He has also noticed in the last 6 months, that he's getting more tired and short of breath with activities that he was able to do before without any difficulty. He tells me that he does walk a lot , but recently has noted limitations whenever he is walking up a hill or climbing stairs. Denies any previous history of stress testing. Denies any syncopal episodes. He tells me he gets heartburns about once or twice a month, but it's not severe enough. No leg swelling.  Home Medications: Prior to Admission medications   Medication Sig Start Date End Date Taking? Authorizing Provider  HYDROcodone-acetaminophen (NORCO)  10-325 MG per tablet Take 1 tablet by mouth every 6 (six) hours as needed for pain.   Yes Historical Provider, MD  metFORMIN (GLUCOPHAGE) 1000 MG tablet Take 1,000 mg by mouth 2 (two) times daily with a meal.   Yes Historical Provider, MD  verapamil (VERELAN PM) 360 MG 24 hr capsule Take 360 mg by mouth daily.   Yes Historical Provider, MD  albuterol (PROVENTIL HFA;VENTOLIN HFA) 108 (90 BASE) MCG/ACT inhaler Inhale 2 puffs into the lungs every 6 (six) hours as needed for wheezing or shortness of breath.  03/30/11   Bethany Hunt, PA-C  escitalopram (LEXAPRO) 20 MG tablet Take 30 mg by mouth daily.     Historical Provider, MD  glipiZIDE (GLUCOTROL) 5 MG tablet Take 5 mg by mouth 2 (two) times daily before a meal.    Historical Provider, MD  lurasidone (LATUDA) 40 MG TABS Take 40 mg by mouth daily with breakfast.    Historical Provider, MD  Tamsulosin HCl (FLOMAX) 0.4 MG CAPS Take 0.4 mg by mouth daily.     Historical Provider, MD    Allergies: No Known Allergies  Past Medical History: Past Medical History  Diagnosis Date  . Hypertension   . Depression   . Diabetes mellitus without complication     Past Surgical History  Procedure Laterality Date  . Back surgery      Social History:  reports that he has been smoking.  He does not have any smokeless tobacco history on file. He reports that  drinks alcohol. He reports that he does not use illicit drugs.  Living Situation: Lives in [place] with his daughter Activity Level: Usually independent with daily activities   Family History:  Family History  Problem Relation Age of Onset  . Heart disease Brother      Review of Systems - History obtained from the patient General ROS: negative Psychological ROS: positive for - anxiety Ophthalmic ROS: negative ENT ROS: negative Allergy and Immunology ROS: negative Hematological and Lymphatic ROS: negative Endocrine ROS: negative Respiratory ROS: as in hpi Cardiovascular ROS: as in  hpi Gastrointestinal ROS: no abdominal pain, change in bowel habits, or black or bloody stools Genito-Urinary ROS: no dysuria, trouble voiding, or hematuria Musculoskeletal ROS: negative Neurological ROS: no TIA or stroke symptoms Dermatological ROS: negative  Physical Examination  Filed Vitals:   09/12/12 0904 09/12/12 1234  BP: 161/92 121/72  Pulse: 74 66  Temp: 98.1 F (36.7 C)   TempSrc: Oral   Resp: 16 18  SpO2: 99% 98%    General appearance: alert, cooperative, appears stated age and no distress Head: Normocephalic, without obvious abnormality, atraumatic Eyes: conjunctivae/corneas clear. PERRL, EOM's intact.  Neck: no adenopathy, no carotid bruit, no JVD, supple, symmetrical, trachea midline and thyroid not enlarged, symmetric, no tenderness/mass/nodules Back: symmetric, no curvature. ROM normal. No CVA tenderness. Resp: clear to auscultation bilaterally Chest wall: left sided chest wall tenderness Cardio: regular rate and rhythm, S1, S2 normal, no murmur, click, rub or gallop GI: soft, non-tender; bowel sounds normal; no masses,  no organomegaly Extremities: extremities normal, atraumatic, no cyanosis or edema Pulses: 2+ and symmetric Skin: Skin color, texture, turgor normal. No rashes or lesions Lymph nodes: Cervical, supraclavicular, and axillary nodes normal. Neurologic: Alert and oriented x3. No focal neurological deficits are present.  Laboratory Data: Results for orders placed during the hospital encounter of 09/12/12 (from the past 48 hour(s))  CBC WITH DIFFERENTIAL     Status: Abnormal   Collection Time    09/12/12  9:12 AM      Result Value Range   WBC 5.1  4.0 - 10.5 K/uL   RBC 5.25  4.22 - 5.81 MIL/uL   Hemoglobin 16.1  13.0 - 17.0 g/dL   HCT 16.1  09.6 - 04.5 %   MCV 84.2  78.0 - 100.0 fL   MCH 30.7  26.0 - 34.0 pg   MCHC 36.4 (*) 30.0 - 36.0 g/dL   RDW 40.9  81.1 - 91.4 %   Platelets 298  150 - 400 K/uL   Neutrophils Relative 39 (*) 43 - 77 %    Neutro Abs 2.0  1.7 - 7.7 K/uL   Lymphocytes Relative 51 (*) 12 - 46 %   Lymphs Abs 2.6  0.7 - 4.0 K/uL   Monocytes Relative 9  3 - 12 %   Monocytes Absolute 0.5  0.1 - 1.0 K/uL   Eosinophils Relative 1  0 - 5 %   Eosinophils Absolute 0.1  0.0 - 0.7 K/uL   Basophils Relative 0  0 - 1 %   Basophils Absolute 0.0  0.0 - 0.1 K/uL  COMPREHENSIVE METABOLIC PANEL     Status: Abnormal   Collection Time    09/12/12  9:12 AM      Result Value Range   Sodium 137  135 - 145 mEq/L   Potassium 3.8  3.5 - 5.1 mEq/L   Chloride 101  96 - 112 mEq/L   CO2 25  19 - 32 mEq/L  Glucose, Bld 163 (*) 70 - 99 mg/dL   BUN 11  6 - 23 mg/dL   Creatinine, Ser 1.61  0.50 - 1.35 mg/dL   Calcium 9.7  8.4 - 09.6 mg/dL   Total Protein 7.5  6.0 - 8.3 g/dL   Albumin 4.0  3.5 - 5.2 g/dL   AST 24  0 - 37 U/L   ALT 31  0 - 53 U/L   Alkaline Phosphatase 57  39 - 117 U/L   Total Bilirubin 0.5  0.3 - 1.2 mg/dL   GFR calc non Af Amer 85 (*) >90 mL/min   GFR calc Af Amer >90  >90 mL/min   Comment:            The eGFR has been calculated     using the CKD EPI equation.     This calculation has not been     validated in all clinical     situations.     eGFR's persistently     <90 mL/min signify     possible Chronic Kidney Disease.  POCT I-STAT TROPONIN I     Status: None   Collection Time    09/12/12  9:29 AM      Result Value Range   Troponin i, poc 0.00  0.00 - 0.08 ng/mL   Comment 3            Comment: Due to the release kinetics of cTnI,     a negative result within the first hours     of the onset of symptoms does not rule out     myocardial infarction with certainty.     If myocardial infarction is still suspected,     repeat the test at appropriate intervals.    Radiology Reports: Dg Chest 2 View  09/12/2012  *RADIOLOGY REPORT*  Clinical Data: Chest pain  CHEST - 2 VIEW  Comparison: 07/03/2011 chest radiograph  Findings: The cardiomediastinal silhouette is unremarkable. Mild right basilar scarring is  again noted. There is no evidence of focal airspace disease, pulmonary edema, suspicious pulmonary nodule/mass, pleural effusion, or pneumothorax. No acute bony abnormalities are identified. Cervical surgery changes again noted.  IMPRESSION: No evidence of acute cardiopulmonary disease.   Original Report Authenticated By: Harmon Pier, M.D.     Electrocardiogram: Sinus rhythm at 76 beats per minute. Normal axis. Intervals are normal. No Q waves. No concerning ST or T-wave changes are noted.  Problem List  Principal Problem:   Chest pain Active Problems:   HTN (hypertension), benign   DM type 2 (diabetes mellitus, type 2)   BPH (benign prostatic hyperplasia)   Assessment: This is a 52 year old, African American male, with past medical history as stated earlier, presents with chest pain. Some of the features are concerning for angina, others are atypical. However, he does have risk factors for heart disease in this form of diabetes, hypertension, tobacco abuse. His brother had some form of heart disease and underwent a heart transplant in his 78s. Patient does need further evaluation for the same. EKG reassuringly does not show ischemic changes. Differential diagnosis for his pain include musculoskeletal, gastrointestinal. His history is not consistent with a venous thromboembolism.  Plan: #1 chest pain with the risk factors as outlined above: At the very least, if he rules out for acute cornary syndrome, he will require stress testing. I have discussed with [facility] cardiology and they would like to see this patient before deciding further course of action. Continue  with aspirin. Troponin will be cycled. Lipid panel will be checked in the morning.  #2 history of type 2 diabetes: HbA1c will be checked. Sliding scale insulin will be instituted. Will hold his oral agents for now.   #3 history of hypertension: Continue with his home medications. Blood pressure was initially  elevated but subsequent values have been normal.   #4 history of bipolar disorder: Continue with his psychotropic agents.  DVT Prophylaxis: Enoxaparin Code Status: Full code Family Communication: Discussed with the patient  Disposition Plan: Tele Obs   Further management decisions will depend on results of further testing and patient's response to treatment.  [facility]  [facility] Pager [phone]  If 7PM-7AM, please contact night-coverage [email or web address] Password [facility]  09/12/2012, 12:47 PM

## 2012-09-12 NOTE — H&P (Signed)
Patient ID: Eduardo French MRN: [id number], DOB/AGE: 1961/03/19   Admit date: 09/12/2012   Primary Physician: Eduardo Livings, MD Primary Cardiologist: Dr Eduardo French (new)  HPI:   52 y/o diabetic, HTN male who smokes- presented to the ER with complaints of Lt sided chest pain for 2-3 months. His symptoms radiate to his Lt posterior shoulder. He says it is both exertional and at rest. He admits to SOB but denies nausea or diaphoresis. It is relieved with rest when he is walking, and sitting up when he has it at night. 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 He lives with his 25y/u daughter. His brother had an LVAD at [facility] and a cardiac transplant at [facility] per the pt.  He finally became concerned that his symptoms have persisted and came to the ER for further evaluation.   Problem List: Past Medical History  Diagnosis Date  . Hypertension   . Depression   . Diabetes mellitus without complication     Past Surgical History  Procedure Laterality Date  . Back surgery       Allergies: No Known Allergies   Home Medications Current Facility-Administered Medications  Medication Dose Route Frequency Provider Last Rate Last Dose  . acetaminophen (TYLENOL) tablet 650 mg  650 mg Oral Q6H PRN Eduardo Shipper, MD       Or  . acetaminophen (TYLENOL) suppository 650 mg  650 mg Rectal Q6H PRN Eduardo Shipper, MD      . albuterol (PROVENTIL HFA;VENTOLIN HFA) 108 (90 BASE) MCG/ACT inhaler 2 puff  2 puff Inhalation Q6H PRN Eduardo Shipper, MD      . albuterol (PROVENTIL) (5 MG/ML) 0.5% nebulizer solution 2.5 mg  2.5 mg Nebulization Q2H PRN Eduardo Shipper, MD      . aspirin EC tablet 81 mg  81 mg Oral Daily Eduardo Shipper, MD      . Melene Muller ON 09/13/2012] atorvastatin (LIPITOR) tablet 40 mg  40 mg Oral q1800 Eduardo K Kilroy, PA-C      . enoxaparin (LOVENOX) injection 50 mg  50 mg Subcutaneous Q24H Eduardo Shipper, MD      . Melene Muller ON 09/13/2012] escitalopram (LEXAPRO) tablet 30 mg  30 mg Oral Daily  Eduardo Shipper, MD      . HYDROcodone-acetaminophen (NORCO) 10-325 MG per tablet 1 tablet  1 tablet Oral Q6H PRN Eduardo Shipper, MD      . insulin aspart (novoLOG) injection 0-15 Units  0-15 Units Subcutaneous TID WC Eduardo Shipper, MD      . Melene Muller ON 09/13/2012] lurasidone (LATUDA) tablet 40 mg  40 mg Oral Q breakfast Eduardo Shipper, MD      . metoprolol tartrate (LOPRESSOR) tablet 12.5 mg  12.5 mg Oral BID Eduardo K Kilroy, PA-C      . ondansetron [facility]) tablet 4 mg  4 mg Oral Q6H PRN Eduardo Shipper, MD       Or  . ondansetron (ZOFRAN) injection 4 mg  4 mg Intravenous Q6H PRN Eduardo Shipper, MD      . Melene Muller ON 09/13/2012] pneumococcal 23 valent vaccine (PNU-IMMUNE) injection 0.5 mL  0.5 mL Intramuscular Tomorrow-1000 Eduardo Espy, MD      . sodium chloride 0.9 % injection 3 mL  3 mL Intravenous Q12H Eduardo Shipper, MD      . tamsulosin (FLOMAX) capsule 0.4 mg  0.4 mg Oral QPC supper Eduardo Shipper, MD      . Melene Muller ON 09/13/2012] verapamil (CALAN-SR) CR tablet 360 mg  360 mg Oral  Daily Eduardo Derrick, PA-C         Family History  Problem Relation Age of Onset  . Heart disease Brother      History   Social History  . Marital Status: Legally Separated    Spouse Name: N/A    Number of Children: N/A  . Years of Education: N/A   Occupational History  . Not on file.   Social History Main Topics  . Smoking status: Current Some Day Smoker -- 0.25 packs/day for 20 years  . Smokeless tobacco: Not on file  . Alcohol Use: 1.8 oz/week    3 Shots of liquor per week     Comment: 1x week  . Drug Use: No  . Sexually Active: Yes   Other Topics Concern  . Not on file   Social History Narrative  . No narrative on file     Review of Systems: General: negative for chills, fever, night sweats or weight changes.  Cardiovascular: negative for chest pain, dyspnea on exertion, edema, orthopnea, palpitations, paroxysmal nocturnal dyspnea or shortness of breath Dermatological: negative for  rash Respiratory: negative for cough or wheezing Urologic: negative for hematuria Abdominal: negative for nausea, vomiting, diarrhea, bright red blood per rectum, melena, or hematemesis Neurologic: negative for visual changes, syncope, or dizziness All other systems reviewed and are otherwise negative except as noted above.  Physical Exam: Blood pressure 140/79, pulse 70, temperature 98 F (36.7 C), temperature source Oral, resp. rate 16, height 5\' 11"  (1.803 m), weight 102 kg (224 lb 13.9 oz), SpO2 100.00%.  General appearance: alert, cooperative and no distress Neck: no carotid bruit, no JVD and anterior sugical scar Lungs: clear to auscultation bilaterally Heart: regular rate and rhythm, S1, S2 normal, no murmur, click, rub or gallop Abdomen: soft, non-tender; bowel sounds normal; no masses,  no organomegaly Extremities: extremities normal, atraumatic, no cyanosis or edema Pulses: 2+ and symmetric Skin: Skin color, texture, turgor normal. No rashes or lesions Neurologic: Grossly normal    Labs:   Results for orders placed during the hospital encounter of 09/12/12 (from the past 24 hour(s))  CBC WITH DIFFERENTIAL     Status: Abnormal   Collection Time    09/12/12  9:12 AM      Result Value Range   WBC 5.1  4.0 - 10.5 K/uL   RBC 5.25  4.22 - 5.81 MIL/uL   Hemoglobin 16.1  13.0 - 17.0 g/dL   HCT 21.3  08.6 - 57.8 %   MCV 84.2  78.0 - 100.0 fL   MCH 30.7  26.0 - 34.0 pg   MCHC 36.4 (*) 30.0 - 36.0 g/dL   RDW 46.9  62.9 - 52.8 %   Platelets 298  150 - 400 K/uL   Neutrophils Relative 39 (*) 43 - 77 %   Neutro Abs 2.0  1.7 - 7.7 K/uL   Lymphocytes Relative 51 (*) 12 - 46 %   Lymphs Abs 2.6  0.7 - 4.0 K/uL   Monocytes Relative 9  3 - 12 %   Monocytes Absolute 0.5  0.1 - 1.0 K/uL   Eosinophils Relative 1  0 - 5 %   Eosinophils Absolute 0.1  0.0 - 0.7 K/uL   Basophils Relative 0  0 - 1 %   Basophils Absolute 0.0  0.0 - 0.1 K/uL  COMPREHENSIVE METABOLIC PANEL     Status:  Abnormal   Collection Time    09/12/12  9:12 AM      Result  Value Range   Sodium 137  135 - 145 mEq/L   Potassium 3.8  3.5 - 5.1 mEq/L   Chloride 101  96 - 112 mEq/L   CO2 25  19 - 32 mEq/L   Glucose, Bld 163 (*) 70 - 99 mg/dL   BUN 11  6 - 23 mg/dL   Creatinine, Ser 1.30  0.50 - 1.35 mg/dL   Calcium 9.7  8.4 - 86.5 mg/dL   Total Protein 7.5  6.0 - 8.3 g/dL   Albumin 4.0  3.5 - 5.2 g/dL   AST 24  0 - 37 U/L   ALT 31  0 - 53 U/L   Alkaline Phosphatase 57  39 - 117 U/L   Total Bilirubin 0.5  0.3 - 1.2 mg/dL   GFR calc non Af Amer 85 (*) >90 mL/min   GFR calc Af Amer >90  >90 mL/min  POCT I-STAT TROPONIN I     Status: None   Collection Time    09/12/12  9:29 AM      Result Value Range   Troponin i, poc 0.00  0.00 - 0.08 ng/mL   Comment 3           TROPONIN I     Status: None   Collection Time    09/12/12  3:04 PM      Result Value Range   Troponin I <0.30  <0.30 ng/mL     Radiology/Studies: Dg Chest 2 View  09/12/2012  *RADIOLOGY REPORT*  Clinical Data: Chest pain  CHEST - 2 VIEW  Comparison: 07/03/2011 chest radiograph  Findings: The cardiomediastinal silhouette is unremarkable. Mild right basilar scarring is again noted. There is no evidence of focal airspace disease, pulmonary edema, suspicious pulmonary nodule/mass, pleural effusion, or pneumothorax. No acute bony abnormalities are identified. Cervical surgery changes again noted.  IMPRESSION: No evidence of acute cardiopulmonary disease.   Original Report Authenticated By: Harmon Pier, M.D.     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 He probably needs cath but unlikely this could be done tomorrow secondary to schedule. He is  on ASA, DVT Lovenox and Verelan. Add statin and low dose beta blocker. Consider ACE at some point with DM/THN.   Eduardo Pretty, PA-C 09/12/2012, 4:28 PM  I have seen & examined the patient along with Mr. Eduardo French.  I agree with his history, exam & recommendations.  52 y/o AAM with multiple Cardiac RFs as noted who presents with increasing frequency and intensity of L sided Chest pressure/discomfort.  It initially would only occur with increased level of exertion, however recently, the symptom can occur earlier on with exertion.  It has also come on at night when first lying down, or upon awakening.  It is a deep pressure along the left sternal margin -- not reproducible on exam.  This AM is was more prolonged, and after multiple "spells" over the weekend, he began to get "scared" and came to the ER.  His brother recently (within the past yr) was admitted to [facility] with cardiac issues --> resulted with IABP followed by LVAD --> to [facility] for transplant.  His ECG is benign with  the exception of a soft Aortic crescendo-decrescendo murmur, and the Troponin is negative.  At this point, I would continue to rule out MI & order an Echocardiogram for AM.  I think that the "crescendo" nature of his symptoms in a man with his RFs & Brother's cardiac history, that his we must seriously consider this to be Crescendo / Usntable Angina.  We discussed invasive, vs. Non-invasive strategies & concluded that he wants definitive answers about +/- CAD, which would mean Cardiac Catheterization over Nuclear ST.  Would switch to Treatment dose Lovenox until 3 sets of Troponin are negative. We will tentatively plan for this tomorrow, but based upon scheduling, may not be able to get him on until Tuesday.  He is OK with this plan.  Will try to arrange transfer to [facility] tomorrow & [facility] can take over as primary service upon transfer.    Continue BB & STatin, ASA  Eduardo French, M.D., M.S. [facility] [address] [place], [place]  [address]  [phone] Pager # [phone] 09/12/2012 4:55 PM

## 2012-09-12 NOTE — ED Provider Notes (Signed)
History     CSN: [id number]  Arrival date & time 09/12/12  2952   First MD Initiated Contact with Patient 09/12/12 0913      No chief complaint on file.   (Consider location/radiation/quality/duration/timing/severity/associated sxs/prior treatment) The history is provided by the patient.   patient here complaining of left-sided chest pain x3 months. Symptoms have been colicky and lasting for minutes to seconds. Some associated dyspnea and diaphoresis. Symptoms are sometimes worse with exertion his symptoms better with rest. No fever cough noted. No leg pain or swelling. Symptoms have become gradually worse over the past 3 months and that is why she is here today. No rashes appreciated. No treatment used prior to arrival.  Past Medical History  Diagnosis Date  . Hypertension   . Depression     Past Surgical History  Procedure Laterality Date  . Back surgery      History reviewed. No pertinent family history.  History  Substance Use Topics  . Smoking status: Current Some Day Smoker  . Smokeless tobacco: Not on file  . Alcohol Use: Yes     Comment: binge drinker frequently all types      Review of Systems  All other systems reviewed and are negative.    Allergies  Review of patient's allergies indicates no known allergies.  Home Medications   Current Outpatient Rx  Name  Route  Sig  Dispense  Refill  . glipiZIDE (GLUCOTROL) 5 MG tablet   Oral   Take 5 mg by mouth 2 (two) times daily before a meal.         . HYDROcodone-acetaminophen (NORCO) 10-325 MG per tablet   Oral   Take 1 tablet by mouth every 6 (six) hours as needed for pain.         . metFORMIN (GLUCOPHAGE) 1000 MG tablet   Oral   Take 1,000 mg by mouth 2 (two) times daily with a meal.         . verapamil (VERELAN PM) 360 MG 24 hr capsule   Oral   Take 360 mg by mouth daily.         Marland Kitchen albuterol (PROVENTIL HFA;VENTOLIN HFA) 108 (90 BASE) MCG/ACT inhaler   Inhalation   Inhale 2 puffs into  the lungs every 6 (six) hours as needed for wheezing or shortness of breath.          . escitalopram (LEXAPRO) 20 MG tablet   Oral   Take 30 mg by mouth daily.          Marland Kitchen lurasidone (LATUDA) 40 MG TABS   Oral   Take 40 mg by mouth daily with breakfast.         . Tamsulosin HCl (FLOMAX) 0.4 MG CAPS   Oral   Take 0.4 mg by mouth daily.            BP 161/92  Pulse 74  Temp(Src) 98.1 F (36.7 C) (Oral)  Resp 16  SpO2 99%  Physical Exam  Nursing note and vitals reviewed. Constitutional: He is oriented to person, place, and time. He appears well-developed and well-nourished.  Non-toxic appearance. No distress.  HENT:  Head: Normocephalic and atraumatic.  Eyes: Conjunctivae, EOM and lids are normal. Pupils are equal, round, and reactive to light.  Neck: Normal range of motion. Neck supple. No tracheal deviation present. No mass present.  Cardiovascular: Normal rate, regular rhythm and normal heart sounds.  Exam reveals no gallop.   No murmur heard. Pulmonary/Chest: Effort normal  and breath sounds normal. No stridor. No respiratory distress. He has no decreased breath sounds. He has no wheezes. He has no rhonchi. He has no rales.  Abdominal: Soft. Normal appearance and bowel sounds are normal. He exhibits no distension. There is no tenderness. There is no rebound and no CVA tenderness.  Musculoskeletal: Normal range of motion. He exhibits no edema and no tenderness.  Neurological: He is alert and oriented to person, place, and time. He has normal strength. No cranial nerve deficit or sensory deficit. GCS eye subscore is 4. GCS verbal subscore is 5. GCS motor subscore is 6.  Skin: Skin is warm and dry. No abrasion and no rash noted.  Psychiatric: He has a normal mood and affect. His speech is normal and behavior is normal.    ED Course  Procedures (including critical care time)  Labs Reviewed  CBC WITH DIFFERENTIAL - Abnormal; Notable for the following:    MCHC 36.4 (*)     Neutrophils Relative 39 (*)    Lymphocytes Relative 51 (*)    All other components within normal limits  COMPREHENSIVE METABOLIC PANEL  POCT I-STAT TROPONIN I   No results found.   No diagnosis found.    MDM   Date: 09/12/2012  Rate: 56  Rhythm: normal sinus rhythm  QRS Axis: normal  Intervals: normal  ST/T Wave abnormalities: nonspecific ST changes  Conduction Disutrbances:none  Narrative Interpretation:   Old EKG Reviewed: none available    Pt to be admitted for acs workup      Toy Baker, MD 09/14/12 [phone]

## 2012-09-12 NOTE — ED Notes (Signed)
Pt presents to ed with c/o left side chest pain. Pt sts chest pain for last 3 months but it's getting much worse lately. Pt sts this am pain 6/10. Pt sts he is a current every day smoker and was told in 2006 that there were "small spot on left lung" that he never followed up on.

## 2012-09-12 NOTE — ED Notes (Signed)
Pt c/o left sided chest pain that radiates to left arm, pt also report left arm numbness as well. Pt sts  Pain feels liek pressure, throbbing and is always there, sometimes it will ease off for a while. Pt also reports dizziness and SOB.

## 2012-09-13 DIAGNOSIS — F319 Bipolar disorder, unspecified: Secondary | ICD-10-CM

## 2012-09-13 LAB — COMPREHENSIVE METABOLIC PANEL
Albumin: 3.5 g/dL (ref 3.5–5.2)
Alkaline Phosphatase: 49 U/L (ref 39–117)
BUN: 15 mg/dL (ref 6–23)
Chloride: 98 mEq/L (ref 96–112)
Creatinine, Ser: 1.08 mg/dL (ref 0.50–1.35)
GFR calc Af Amer: >90 mL/min (ref >90)
GFR calc non Af Amer: 78 mL/min — ABNORMAL LOW (ref >90)
Glucose, Bld: 145 mg/dL — ABNORMAL HIGH (ref 70–99)
Potassium: 3.8 mEq/L (ref 3.5–5.1)
Total Bilirubin: 0.4 mg/dL (ref 0.3–1.2)

## 2012-09-13 LAB — GLUCOSE, CAPILLARY
Glucose-Capillary: 168 mg/dL — ABNORMAL HIGH (ref 70–99)
Glucose-Capillary: 109 mg/dL — ABNORMAL HIGH (ref 70–99)

## 2012-09-13 LAB — CBC
Platelets: 236 10*3/uL (ref 150–400)
RBC: 4.74 MIL/uL (ref 4.22–5.81)
WBC: 7.5 10*3/uL (ref 4.0–10.5)

## 2012-09-13 LAB — LIPID PANEL
Cholesterol: 242 mg/dL — ABNORMAL HIGH (ref 0–200)
Triglycerides: 328 mg/dL — ABNORMAL HIGH (ref <150)
VLDL: 66 mg/dL — ABNORMAL HIGH (ref 0–40)

## 2012-09-13 MED ORDER — SODIUM CHLORIDE 0.9 % IV SOLN
1.0000 mL/kg/h | INTRAVENOUS | Status: DC
Start: 2012-09-14 — End: 2012-09-14
  Administered 2012-09-14: 1 mL/kg/h via INTRAVENOUS

## 2012-09-13 MED ORDER — SODIUM CHLORIDE 0.9 % IV SOLN: 250.0000 mL | INTRAVENOUS | Status: DC | PRN

## 2012-09-13 MED ORDER — ASPIRIN 81 MG PO CHEW
324.0000 mg | CHEWABLE_TABLET | ORAL | Status: AC
  Administered 2012-09-14: 324 mg via ORAL
  Filled 2012-09-13: qty 4

## 2012-09-13 MED ORDER — SODIUM CHLORIDE 0.9 % IJ SOLN: 3.0000 mL | INTRAMUSCULAR | Status: DC | PRN

## 2012-09-13 MED ORDER — SODIUM CHLORIDE 0.9 % IJ SOLN: 3.0000 mL | Freq: Two times a day (BID) | INTRAMUSCULAR | Status: DC

## 2012-09-13 NOTE — Progress Notes (Signed)
  Echocardiogram 2D Echocardiogram has been performed.  Eduardo French 09/13/2012, 12:17 PM

## 2012-09-13 NOTE — Progress Notes (Signed)
Subjective:  No chest pain  Objective:  Vital Signs in the last 24 hours: Temp:  [97.6 F (36.4 C)-98.2 F (36.8 C)] 97.6 F (36.4 C) (05/12 0557) Pulse Rate:  [60-70] 68 (05/12 1009) Resp:  [16-18] 18 (05/12 0557) BP: (115-140)/(62-79) 123/74 mmHg (05/12 0557) SpO2:  [96 %-100 %] 96 % (05/12 0557) Weight:  [102 kg (224 lb 13.9 oz)] 102 kg (224 lb 13.9 oz) (05/11 1419)  Intake/Output from previous day:  Intake/Output Summary (Last 24 hours) at 09/13/12 1320 Last data filed at 09/13/12 0500  Gross per 24 hour  Intake    480 ml  Output   1000 ml  Net   -520 ml    Physical Exam: General appearance: alert, cooperative and no distress Lungs: clear to auscultation bilaterally Heart: regular rate and rhythm   Rate: 68  Rhythm: normal sinus rhythm  Lab Results:  Recent Labs  09/12/12 0912 09/13/12 0133  WBC 5.1 7.5  HGB 16.1 14.5  PLT 298 236    Recent Labs  09/12/12 0912 09/13/12 0133  NA 137 133*  K 3.8 3.8  CL 101 98  CO2 25 25  GLUCOSE 163* 145*  BUN 11 15  CREATININE 1.00 1.08    Recent Labs  09/12/12 2010 09/13/12 0133  TROPONINI <0.30 <0.30   Hepatic Function Panel  Recent Labs  09/13/12 0133  PROT 6.5  ALBUMIN 3.5  AST 15  ALT 24  ALKPHOS 49  BILITOT 0.4    Recent Labs  09/13/12 0131  CHOL 242*    Recent Labs  09/13/12 1145  INR 0.98    Imaging: Imaging results have been reviewed  Cardiac Studies:  Assessment/Plan:   Principal Problem:   Unstable angina Active Problems:   HTN (hypertension), benign   DM type 2 (diabetes mellitus, type 2)   Dyslipidemia- (LDL 190 in 2011)   Smoker   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  Corine Shelter PA-C Beeper [phone] 09/13/2012, 1:20 PM   I have seen and examined the patient along with Corine Shelter PA-C.  I have reviewed the chart, notes and new data.  I agree  with PA's note.  PLAN: Unfortunately schedule did not allow procedure today. On schedule 10:30 in AM This procedure has been fully reviewed with the patient and written informed consent has been obtained. Note favorable Allen's test. Plan radial approach. Discussed possibility of same-day PCI if needed. He would prefer same day revascularization.  Thurmon Fair, MD, [facility] [phone] 09/13/2012, 9:12 PM

## 2012-09-13 NOTE — Progress Notes (Signed)
[facility] PROGRESS NOTE  Eduardo French GNF:[id number] DOB: [birth year] DOA: 09/12/2012  PCP: Eduardo Livings, MD  Brief HPI: Eduardo French is a 52 y.o. male with a past medical history of hypertension, diabetes, BPH, bipolar disorder, who was in his usual state of health till night before admission when he was resting and suddenly started experiencing aching sensation in the retrosternal area radiating to the left chest and to his left arm and into his fingers which then felt numb. He decided to sleep through the night and when he woke up he continued to have this pain and so, he decided to come in to the hospital. He has had some dizziness and lightheadedness, but none at assessment. The pain was 5/10 in intensity and had resolved by admission. Had a dry cough. Denied any fever or chills. Has had some shortness of breath in the past. Denied any nausea, but admits to poor appetite. When the pain is present it is sometimes aggravated by lifting his arms. No real relieving factors. He took an aspirin with no relief. He mentioned, that he's been having this pain on and off for the last 3-4 months. Sometimes the pain occurs with exertion, sometimes with anxiety. He has also noticed in the last 6 months, that he's getting more tired and short of breath with activities that he was able to do before without any difficulty. He mentioned that he does walk a lot , but recently has noted limitations whenever he is walking up a hill or climbing stairs. Denied any previous history of stress testing. Denied any syncopal episodes. He tells me he gets heartburns about once or twice a month, but it's not severe enough. No leg swelling.  Past medical history:  Past Medical History  Diagnosis Date  . Hypertension   . Depression   . Diabetes mellitus without complication     Consultants: [facility]  Procedures: None yet  Antibiotics: None  Subjective: Patient returned after walking in hallway. Denies any  chest pain. No new complaints.  Objective: Vital Signs  Filed Vitals:   09/12/12 1234 09/12/12 1419 09/12/12 2210 09/13/12 0557  BP: 121/72 140/79 115/62 123/74  Pulse: 66 70 68 60  Temp:  98 F (36.7 C) 98.2 F (36.8 C) 97.6 F (36.4 C)  TempSrc:  Oral Oral Oral  Resp: 18 16 18 18   Height:  5\' 11"  (1.803 m)    Weight:  102 kg (224 lb 13.9 oz)    SpO2: 98% 100% 99% 96%    Intake/Output Summary (Last 24 hours) at 09/13/12 1120 Last data filed at 09/13/12 0500  Gross per 24 hour  Intake    480 ml  Output   1000 ml  Net   -520 ml   Filed Weights   09/12/12 1419  Weight: 102 kg (224 lb 13.9 oz)    General appearance: alert, cooperative and no distress Resp: clear to auscultation bilaterally Cardio: regular rate and rhythm, S1, S2 normal. Soft systolic murmur at aortic area. no click, rub or gallop GI: soft, non-tender; bowel sounds normal; no masses,  no organomegaly Neurologic: Alert and oriented x 3. No focal deficits.  Lab Results:  Basic Metabolic Panel:  Recent Labs Lab 09/12/12 0912 09/13/12 0133  NA 137 133*  K 3.8 3.8  CL 101 98  CO2 25 25  GLUCOSE 163* 145*  BUN 11 15  CREATININE 1.00 1.08  CALCIUM 9.7 8.8   Liver Function Tests:  Recent Labs Lab 09/12/12 0912 09/13/12 0133  AST 24 15  ALT 31 24  ALKPHOS 57 49  BILITOT 0.5 0.4  PROT 7.5 6.5  ALBUMIN 4.0 3.5   CBC:  Recent Labs Lab 09/12/12 0912 09/13/12 0133  WBC 5.1 7.5  NEUTROABS 2.0  --   HGB 16.1 14.5  HCT 44.2 40.0  MCV 84.2 84.4  PLT 298 236   Cardiac Enzymes:  Recent Labs Lab 09/12/12 1504 09/12/12 2010 09/13/12 0133  TROPONINI <0.30 <0.30 <0.30   BNP (last 3 results) No results found for this basename: PROBNP,  in the last 8760 hours CBG:  Recent Labs Lab 09/12/12 1708 09/12/12 2310 09/13/12 0729  GLUCAP 181* 151* 164*    No results found for this or any previous visit (from the past 240 hour(s)).    Studies/Results: Dg Chest 2 View  09/12/2012   *RADIOLOGY REPORT*  Clinical Data: Chest pain  CHEST - 2 VIEW  Comparison: 07/03/2011 chest radiograph  Findings: The cardiomediastinal silhouette is unremarkable. Mild right basilar scarring is again noted. There is no evidence of focal airspace disease, pulmonary edema, suspicious pulmonary nodule/mass, pleural effusion, or pneumothorax. No acute bony abnormalities are identified. Cervical surgery changes again noted.  IMPRESSION: No evidence of acute cardiopulmonary disease.   Original Report Authenticated By: Harmon Pier, M.D.     Medications:  Scheduled: . aspirin EC  81 mg Oral Daily  . atorvastatin  40 mg Oral q1800  . enoxaparin (LOVENOX) injection  50 mg Subcutaneous Q24H  . escitalopram  30 mg Oral Daily  . insulin aspart  0-15 Units Subcutaneous TID WC  . lurasidone  40 mg Oral Q breakfast  . metoprolol tartrate  12.5 mg Oral BID  . sodium chloride  3 mL Intravenous Q12H  . tamsulosin  0.4 mg Oral QPC supper  . 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 Plan is for ECHO and cardiac catheterization. Continue aspirin. Further management per cards. He has ruled out for ACS. LDL is 139, TG is 328. Will need treatment for same at discharge.   History of type 2 diabetes HbA1c is 8.0. Sliding scale insulin will be continued. Holding oral agents.   History of hypertension Continue with his home medications. Blood pressure was initially elevated but subsequent values have been normal.   History of bipolar  disorder Continue with his psychotropic agents.   DVT Prophylaxis: Enoxaparin  Code Status: Full code  Family Communication: Discussed with the patient  Disposition Plan: Depending on cardiac work up. [facility].    LOS: 1 day   [facility]  [facility] Pager [phone] 09/13/2012, 11:20 AM  If 8PM-8AM, please contact night-coverage at [email or web address], password [facility]

## 2012-09-14 ENCOUNTER — Encounter (HOSPITAL_COMMUNITY)
Admission: EM | Disposition: A | Discharge status: Home / Self Care | Payer: Self-pay | Source: Home / Self Care | Attending: Emergency Medicine

## 2012-09-14 DIAGNOSIS — I2510 Atherosclerotic heart disease of native coronary artery without angina pectoris: Secondary | ICD-10-CM

## 2012-09-14 HISTORY — PX: LEFT HEART CATHETERIZATION WITH CORONARY ANGIOGRAM: SHX5451

## 2012-09-14 LAB — GLUCOSE, CAPILLARY

## 2012-09-14 SURGERY — LEFT HEART CATHETERIZATION WITH CORONARY ANGIOGRAM
Anesthesia: LOCAL

## 2012-09-14 MED ORDER — FENTANYL CITRATE 0.05 MG/ML IJ SOLN
INTRAMUSCULAR | Status: AC
  Filled 2012-09-14: qty 2

## 2012-09-14 MED ORDER — MIDAZOLAM HCL 2 MG/2ML IJ SOLN
INTRAMUSCULAR | Status: AC
  Filled 2012-09-14: qty 2

## 2012-09-14 MED ORDER — NITROGLYCERIN 1 MG/10 ML FOR IR/CATH LAB
INTRA_ARTERIAL | Status: AC
  Filled 2012-09-14: qty 10

## 2012-09-14 MED ORDER — LIDOCAINE HCL (PF) 1 % IJ SOLN
INTRAMUSCULAR | Status: AC
  Filled 2012-09-14: qty 30

## 2012-09-14 MED ORDER — ATORVASTATIN CALCIUM 40 MG PO TABS: 40.0000 mg | ORAL_TABLET | Freq: Every day | ORAL | Status: DC

## 2012-09-14 MED ORDER — HEPARIN (PORCINE) IN NACL 2-0.9 UNIT/ML-% IJ SOLN
INTRAMUSCULAR | Status: AC
  Filled 2012-09-14: qty 1000

## 2012-09-14 MED ORDER — HEPARIN SODIUM (PORCINE) 1000 UNIT/ML IJ SOLN
INTRAMUSCULAR | Status: AC
  Filled 2012-09-14: qty 1

## 2012-09-14 MED ORDER — VERAPAMIL HCL 2.5 MG/ML IV SOLN
INTRAVENOUS | Status: AC
  Filled 2012-09-14: qty 2

## 2012-09-14 NOTE — Progress Notes (Addendum)
Pt back from [facility] Cath Lab Discharge instructions given to pt.  Voices understanding.  No changes noted since am assessment.  Dressing noted to right radial area dry and intact.

## 2012-09-14 NOTE — Discharge Summary (Signed)
Physician Discharge Summary  Patient ID: Eduardo French MRN: [id number] DOB/AGE: 11/09/1960 52 y.o.  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 [facility] Course: 52 y/o diabetic, HTN male who smokes- presented to the ER with complaints of Lt sided chest pain for 2-3 months. His symptoms radiate to his Lt posterior shoulder. He says it is both exertional and at rest. He admits to SOB but denies nausea or diaphoresis. It is relieved with rest when he is walking, and sitting up when he has it at night. 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 He lives with his 52y/u daughter. His brother had an LVAD at [facility] and a cardiac transplant at [facility] per the pt. He finally became concerned that his symptoms have persisted and came to the ER for further evaluation.   [facility] admitted the pt and Dr. Herbie Baltimore saw in consult.  Troponins were negative.  Pt wanted a definitive answer concerning his chest pain - cardiac cath was planned.   Cardiac cath IMPRESSIONS:  Mild coronary atherosclerosis, no true stenotic lesions.  RECOMMENDATION: Risk factor modification, especially smoking cessation, treatment of dyslipidemia, weight loss and improvements in diet and physical activity.  Please see complete note for details.  Pt was ready for discharge after he completed cath recovery.  He was started on a statin for elevated cholesterol with diabetes.  He was instructed to stop smoking.  He will follow up in our office to review  need for lifestyle changes, diet, exercise and tobacco issues.    Consults: cardiology  Significant Diagnostic Studies:  BMET    Component Value Date/Time   NA 133* 09/13/2012 0133   K 3.8 09/13/2012 0133   CL 98 09/13/2012 0133   CO2 25 09/13/2012 0133   GLUCOSE 145* 09/13/2012 0133   BUN 15 09/13/2012 0133   CREATININE 1.08 09/13/2012 0133   CALCIUM 8.8 09/13/2012 0133   GFRNONAA 78* 09/13/2012 0133   GFRAA >90 09/13/2012 0133    CBC    Component Value Date/Time   WBC 7.5 09/13/2012 0133   RBC 4.74 09/13/2012 0133   HGB 14.5 09/13/2012 0133   HCT 40.0 09/13/2012 0133   PLT 236 09/13/2012 0133   MCV 84.4 09/13/2012 0133   MCH 30.6 09/13/2012 0133   MCHC 36.3* 09/13/2012 0133   RDW 12.7 09/13/2012 0133   LYMPHSABS 2.6 09/12/2012 0912   MONOABS 0.5 09/12/2012 0912   EOSABS 0.1 09/12/2012 0912   BASOSABS 0.0 09/12/2012 0912   Total chol  242, TG 328, HDL 37, LDL 139  Troponin I neg X 3  <0.30   2D ECHO: Study Conclusions  - Left ventricle: The cavity size was normal. Wall thickness was normal. Systolic function was normal. The estimated ejection fraction was in the range of 55% to 60%. Wall motion was normal; there were no regional wall motion abnormalities. Left ventricular diastolic function parameters were normal. - Left atrium: The atrium was mildly dilated. Transthoracic echocardiography. M-mode, complete 2D, spectral Doppler, and color  Doppler. Height: Height: 180.3cm. Height: 71in. Weight: Weight: 101.6kg. Weight: 223.5lb. Body mass index: BMI: 31.2kg/m^2. Body surface area: BSA: 2.21m^2. Blood pressure: 123/74. Patient status: Inpatient. Location: Bedside.  EKG:NSR without acute changes    Discharge Exam: Blood pressure 133/83, pulse 67, temperature 97.9 F (36.6 C), temperature source Oral, resp. rate 16, height 5\' 11"  (1.803 m), weight 225 lb 5 oz (102.2 kg), SpO2 99.00%.   Disposition: 01-Home or Self Care       Future Appointments Provider Department Dept  Phone   09/22/2012 10:00 AM Nada Boozer, NP [facility]  [phone]       Medication List    TAKE these medications       albuterol 108 (90 BASE) MCG/ACT inhaler  Commonly known as:  PROVENTIL HFA;VENTOLIN HFA  Inhale 2 puffs into the lungs every 6 (six) hours as needed for wheezing or shortness of breath.     atorvastatin 40 MG tablet  Commonly known as:  LIPITOR  Take 1 tablet (40 mg total) by mouth daily at 6 PM.     escitalopram 20 MG tablet  Commonly known as:  LEXAPRO  Take 30 mg by mouth daily.     glipiZIDE 5 MG tablet  Commonly known as:  GLUCOTROL  Take 5 mg by mouth 2 (two) times daily before a meal.     HYDROcodone-acetaminophen 10-325 MG per tablet  Commonly known as:  NORCO  Take 1 tablet by mouth every 6 (six) hours as needed for pain.     lurasidone 40 MG Tabs  Commonly known as:  LATUDA  Take 40 mg by mouth daily with breakfast.     metFORMIN 1000 MG tablet  Commonly known as:  GLUCOPHAGE  Take 1,000 mg by mouth 2 (two) times daily with a meal.     tamsulosin 0.4 MG Caps  Commonly known as:  FLOMAX  Take 0.4 mg by mouth daily.     verapamil 360 MG 24 hr capsule  Commonly known as:  VERELAN PM  Take 360 mg by mouth daily.       Follow-up Information   Follow up with [facility] R, NP On 09/22/2012. (AT 10:00  am)    Contact information:   [address] [place] [address] [phone]      Discharge Instructions:  Call [facility] if any bleeding, swelling or drainage at cath site.  May shower, no tub baths for 48 hours for groin sticks.   NO LIFTING OVER 5 POUNDS FOR 3 DAYS WITH RT. ARM.  NO DRIVING FOR 3 DAYS.  DO NOT TAKE METFORMIN UNTIL 09/17/12-  IT MAY INTERACT WITH CATH DYE.    STOP SMOKING.  NEW MEDICATION FOR ELEVATED CHOLESTEROL.   Signed: Leone Brand Nurse Practitioner-Certified [facility] 09/15/2012, 9:05  AM  Time spent on discharge :30 minutes.

## 2012-09-14 NOTE — CV Procedure (Signed)
CARDIAC CATHETERIZATION REPORT   Procedures performed:  1. Left heart catheterization  2. Selective coronary angiography   Reason for procedure:  Unexplained atypical chest pain  Procedure performed by: Thurmon Fair, MD, [facility]  Complications: none   Estimated blood loss: less than 5 mL   History:  52 y/o diabetic, HTN male who smokes- presented to the ER with complaints of Lt sided chest pain for 2-3 months. His symptoms radiate to his Lt posterior shoulder. He says it is both exertional and at rest. He admits to SOB but denies nausea or diaphoresis. It is relieved with rest when he is walking, and sitting up when he has it at night.   Consent: The risks, benefits, and details of the procedure were explained to the patient. Risks including death, MI, stroke, bleeding, limb ischemia, renal failure and allergy were described and accepted by the patient. Informed written consent was obtained prior to proceeding.  Technique: The patient was brought to the cardiac catheterization laboratory in the fasting state. He was prepped and draped in the usual sterile fashion. Local anesthesia with 1% lidocaine was administered to the right wrist area. Using the modified Seldinger technique a 5 French right radial artery sheath was introduced without difficulty. Under fluoroscopic guidance, using 5 French JL3.5 and JR pigtail catheters, selective cannulation of the left coronary artery, right coronary artery and left ventricle were respectively performed. Several coronary angiograms in a variety of projections were recorded. Left ventricular pressure and a pull back to the aorta were recorded. No immediate complications occurred. At the end of the procedure, all catheters were removed. After the procedure, hemostasis will be achieved with manual pressure.  Contrast used: 75 mL Omnipaque  Angiographic Findings:  1. The left main coronary artery is free of significant atherosclerosis and bifurcates in the  usual fashion into the left anterior descending artery and left circumflex coronary artery.  2. The left anterior descending artery is a large vessel that reaches the apex and generates four major diagonal branches, but only the proximal one is large. There is evidence of mild to moderate luminal irregularities and no calcification. There is a 30% proximal lesion, but no hemodynamically meaningful stenoses are seen. 3. The left circumflex coronary artery is a medium-size vessel non dominant vessel that generates two major oblique marginal arteries, the distal being much larger. There is evidence of mild luminal irregularities and no calcification. No hemodynamically meaningful stenoses are seen. 4. The right coronary artery is a very large-size dominant vessel that generates a very lon  posterior lateral ventricular system as well as the PDA. There is evidence of mild to moderate luminal irregularities and no calcification. There is a 20-30% proximal lesion, but no hemodynamically meaningful stenoses are seen.  5. The left ventricle is normal in size and systolic function is normal by echo with an estimated ejection fraction of 55%. Regional wall motion abnormalities are not seen. No left ventricular thrombus is seen. There is no mitral insufficiency. The ascending aorta appears normal. There is no aortic valve stenosis by pullback. The left ventricular end-diastolic pressure is 20 mm Hg, mildly elevated.    IMPRESSIONS:  Mild coronary atherosclerosis, no true stenotic lesions.  RECOMMENDATION:  Risk factor modification, especially smoking cessation, treatment of dyslipidemia, weight loss and improvements in diet and physical activity.    Thurmon Fair, MD, [facility] [phone] office [phone] pager

## 2012-09-15 ENCOUNTER — Encounter (HOSPITAL_COMMUNITY): Payer: Self-pay | Admitting: Cardiology

## 2012-09-15 DIAGNOSIS — Z9889 Other specified postprocedural states: Secondary | ICD-10-CM

## 2012-09-15 HISTORY — DX: Other specified postprocedural states: Z98.890

## 2012-09-22 ENCOUNTER — Ambulatory Visit: Payer: Medicaid Other | Admitting: Cardiology

## 2012-10-11 ENCOUNTER — Ambulatory Visit: Payer: Medicaid Other | Attending: Family Medicine | Admitting: Family Medicine

## 2012-10-11 VITALS — BP 174/84 | HR 90 | Temp 98.8°F | Resp 18 | Wt 227.6 lb

## 2012-10-11 DIAGNOSIS — E119 Type 2 diabetes mellitus without complications: Secondary | ICD-10-CM | POA: Insufficient documentation

## 2012-10-11 DIAGNOSIS — M1990 Unspecified osteoarthritis, unspecified site: Secondary | ICD-10-CM

## 2012-10-11 DIAGNOSIS — I200 Unstable angina: Secondary | ICD-10-CM

## 2012-10-11 DIAGNOSIS — Z9889 Other specified postprocedural states: Secondary | ICD-10-CM

## 2012-10-11 DIAGNOSIS — I10 Essential (primary) hypertension: Secondary | ICD-10-CM | POA: Insufficient documentation

## 2012-10-11 DIAGNOSIS — N400 Enlarged prostate without lower urinary tract symptoms: Secondary | ICD-10-CM

## 2012-10-11 DIAGNOSIS — F17200 Nicotine dependence, unspecified, uncomplicated: Secondary | ICD-10-CM | POA: Insufficient documentation

## 2012-10-11 DIAGNOSIS — F319 Bipolar disorder, unspecified: Secondary | ICD-10-CM

## 2012-10-11 NOTE — Addendum Note (Signed)
Addended by: Cleora Fleet on: 10/11/2012 09:46 AM   Modules accepted: Orders

## 2012-10-11 NOTE — Progress Notes (Signed)
Patient ID: Eduardo French, male   DOB: [birth year], 52 y.o.   MRN: [id number]  CC: establish  HPI: Pt is presenting to establish care.  He was recently seen in hospital for chest pain and angina.  Pt is a smoker with HTN and DM.  Pt says that he has not taken his medications today.  He has poorly controlled DM.  Pt says that he is on chronic pain meds for multiple orthopedic issues.  He has not followed up with cardiology since being discharged because he says that he could not find the building.  He did not bring any medical records with him today.     No Known Allergies Past Medical History  Diagnosis Date  . Hypertension   . Depression   . Diabetes mellitus without complication   . S/P cardiac catheterization, 09/14/12 with mild non obstructive CAD and EF of 55% 09/15/2012   Current Outpatient Prescriptions on File Prior to Visit  Medication Sig Dispense Refill  . albuterol (PROVENTIL HFA;VENTOLIN HFA) 108 (90 BASE) MCG/ACT inhaler Inhale 2 puffs into the lungs every 6 (six) hours as needed for wheezing or shortness of breath.       Marland Kitchen atorvastatin (LIPITOR) 40 MG tablet Take 1 tablet (40 mg total) by mouth daily at 6 PM.  30 tablet  6  . escitalopram (LEXAPRO) 20 MG tablet Take 30 mg by mouth daily.       Marland Kitchen glipiZIDE (GLUCOTROL) 5 MG tablet Take 5 mg by mouth 2 (two) times daily before a meal.      . HYDROcodone-acetaminophen (NORCO) 10-325 MG per tablet Take 1 tablet by mouth every 6 (six) hours as needed for pain.      Marland Kitchen lurasidone (LATUDA) 40 MG TABS Take 40 mg by mouth daily with breakfast.      . metFORMIN (GLUCOPHAGE) 1000 MG tablet Take 1,000 mg by mouth 2 (two) times daily with a meal.      . Tamsulosin HCl (FLOMAX) 0.4 MG CAPS Take 0.4 mg by mouth daily.       . verapamil (VERELAN PM) 360 MG 24 hr capsule Take 360 mg by mouth daily.       No current facility-administered medications on file prior to visit.   Family History  Problem Relation Age of Onset  . Heart disease  Brother    History   Social History  . Marital Status: Legally Separated    Spouse Name: N/A    Number of Children: N/A  . Years of Education: N/A   Occupational History  . Not on file.   Social History Main Topics  . Smoking status: Current Some Day Smoker -- 0.25 packs/day for 20 years  . Smokeless tobacco: Not on file  . Alcohol Use: 1.8 oz/week    3 Shots of liquor per week     Comment: 1x week  . Drug Use: No  . Sexually Active: Yes   Other Topics Concern  . Not on file   Social History Narrative  . No narrative on file    Review of Systems  Constitutional: Negative for fever, chills, diaphoresis, activity change, appetite change and fatigue.  HENT: Negative for ear pain, nosebleeds, congestion, facial swelling, rhinorrhea, neck pain, neck stiffness and ear discharge.   Eyes: Negative for pain, discharge, redness, itching and visual disturbance.  Respiratory: Negative for cough, choking, chest tightness, shortness of breath, wheezing and stridor.   Cardiovascular: Negative for chest pain, palpitations and leg swelling.  Gastrointestinal:  Negative for abdominal distention.  Genitourinary: Negative for dysuria, urgency, frequency, hematuria, flank pain, decreased urine volume, difficulty urinating and dyspareunia.  Musculoskeletal: Negative for back pain, joint swelling, arthralgias and gait problem.  Neurological: Negative for dizziness, tremors, seizures, syncope, facial asymmetry, speech difficulty, weakness, light-headedness, numbness and headaches.  Hematological: Negative for adenopathy. Does not bruise/bleed easily.  Psychiatric/Behavioral: Negative for hallucinations, behavioral problems, confusion, dysphoric mood, decreased concentration and agitation.    Objective:   Filed Vitals:   10/11/12 0924  BP: 174/84  Pulse: 90  Temp: 98.8 F (37.1 C)  Resp: 18    Physical Exam  Constitutional: Appears well-developed and well-nourished. No distress. dental  caries.   HENT: Normocephalic. External right and left ear normal. Oropharynx is clear and moist.  Eyes: Conjunctivae and EOM are normal. PERRLA, no scleral icterus.  Neck: Normal ROM. Neck supple. No JVD. No tracheal deviation. No thyromegaly.  CVS: RRR, S1/S2 +, no murmurs, no gallops, no carotid bruit.  Pulmonary: Effort and breath sounds normal, no stridor, rhonchi, wheezes, rales.  Abdominal: Soft. BS +,  no distension, tenderness, rebound or guarding.  Musculoskeletal: Normal range of motion. No edema and no tenderness.  Lymphadenopathy: No lymphadenopathy noted, cervical, inguinal. Neuro: Alert. Normal reflexes, muscle tone coordination. No cranial nerve deficit. Skin: Skin is warm and dry. No rash noted. Not diaphoretic. No erythema. No pallor.  Psychiatric: Normal mood and affect. Behavior, judgment, thought content normal.   Lab Results  Component Value Date   WBC 7.5 09/13/2012   HGB 14.5 09/13/2012   HCT 40.0 09/13/2012   MCV 84.4 09/13/2012   PLT 236 09/13/2012   Lab Results  Component Value Date   CREATININE 1.08 09/13/2012   BUN 15 09/13/2012   NA 133* 09/13/2012   K 3.8 09/13/2012   CL 98 09/13/2012   CO2 25 09/13/2012    Lab Results  Component Value Date   HGBA1C 8.0* 09/12/2012   Lipid Panel     Component Value Date/Time   CHOL 242* 09/13/2012 0131   TRIG 328* 09/13/2012 0131   HDL 37* 09/13/2012 0131   CHOLHDL 6.5 09/13/2012 0131   VLDL 66* 09/13/2012 0131   LDLCALC 139* 09/13/2012 0131       Assessment and plan:   Patient Active Problem List   Diagnosis Date Noted  . S/P cardiac catheterization, 09/14/12 with mild non obstructive CAD and EF of 55% 09/15/2012  . HTN (hypertension), benign 09/12/2012  . DM type 2 (diabetes mellitus, type 2) 09/12/2012  . BPH (benign prostatic hyperplasia) 09/12/2012  . Unstable angina, cardiac cath without obstruction 09/12/2012  . Smoker 09/12/2012  . 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 Depression  09/12/2012  . Bipolar disorder, unspecified 09/12/2012  . Dyslipidemia- (LDL 190 in 2011) 09/12/2012   Obtain medical records.  Encouraged pt to please follow up with cardiology as recommended at hospital discharge Asked pt to please stop smoking Reviewed labs and hospital records Follow up with patient after more medical records available.  I explained to the patient that we do not do chronic pain management at this facility but will refer him out to pain management. The patient verbalized understanding.   Refer to GI for screening colonoscopy  The patient was given clear instructions to go to ER or return to medical center if symptoms don't improve, worsen or new problems develop.  The patient verbalized understanding.  The patient was told to call to get lab results if they haven't heard  anything in the next week.    The patient was counseled on the dangers of tobacco use, and was advised to quit.  Reviewed strategies to maximize success, including removing cigarettes and smoking materials from environment.   Follow up in 2 weeks for BP check   Rodney Langton, MD, CDE, FAAFP [facility] [place], [place]

## 2012-10-11 NOTE — Patient Instructions (Signed)
Coronary Artery Disease, Risk Factors Research has shown that the risk of developing coronary artery disease (CAD) and having a heart attack increases with each factor you have. RISK FACTORS YOU CANNOT CHANGE  Your age. Your risk goes up as you get older. Most heart attacks happen to people over the age of 24.  Gender. Men have a greater risk of heart attack than women, and they have attacks earlier in life. However, women are more likely to die from a heart attack.  Heredity. Children of parents with heart disease are more likely to develop it themselves.  Race. African Americans and other ethnic groups have a higher risk, possibly because of high blood pressure, a tendency toward obesity, and diabetes.  Your family. Most people with a strong family history of heart disease have one or more other risk factors. RISK FACTORS YOU CAN CHANGE  Exposure to tobacco smoke. Even secondhand smoke greatly increases the risk for heart disease.  High blood cholesterol may be lowered with changes in diet, activity, and medicines.  High blood pressure makes the heart work harder. This causes the heart muscles to become thick and, eventually, weaker. It also increases your risk of stroke, heart attack, and kidney or heart failure.  Physical inactivity is a risk factor for CAD. Regular physical activity helps prevent heart and blood vessel disease. Exercise helps control blood cholesterol, diabetes, obesity, and it may help lower blood pressure in some people.  Excess body fat, especially belly fat, increases the risk of heart disease and stroke even if there are no other risk factors. Excess weight increases the heart's workload and raises blood pressure and blood cholesterol.  Diabetes seriously increases your risk of developing CAD. If you have diabetes, you should work with your caregiver to manage it and control other risk factors. OTHER RISK FACTORS FOR CAD  How you respond to stress.  Drinking  too much alcohol may raise blood pressure, cause heart failure, and lead to stroke.  Total cholesterol greater than 200 milligrams.  HDL (good) cholesterol less than 40 milligrams. HDL helps keep cholesterol from building up in the walls of the arteries. PREVENTING CAD  Maintain a healthy weight.  Exercise or do physical activity.  Eat a heart-healthy diet low in fat and salt and high in fiber.  Control your blood pressure to keep it below 120 over 80.  Keep your cholesterol at a level that lowers your risk.  Manage diabetes if you have it.  Stop smoking.  Learn how to manage stress. HEART SMART SUBSTITUTIONS  Instead of whole or 2% milk and cream, use skim milk.  Instead of fried foods, eat baked, steamed, boiled, broiled, or microwaved foods.  Instead of lard, butter, palm and coconut oils, cook with unsaturated vegetable oils, such as corn, olive, canola, safflower, sesame, soybean, sunflower, or peanut.  Instead of fatty cuts of meat, eat lean cuts of meat or cut off the fatty parts.  Instead of 1 whole egg in recipes, use 2 egg whites.  Instead of sauces, butter, and salt, season vegetables with herbs and spices.  Instead of regular hard and processed cheeses, eat low-fat, low-sodium cheeses.  Instead of salted potato chips, choose low-fat, unsalted tortilla and potato chips and unsalted pretzels and popcorn.  Instead of sour cream and mayonnaise, use plain low-fat yogurt, low-fat cottage cheese, or low-fat or "light" sour cream. FOR MORE INFORMATION  National Heart Lung and Blood Institute: http://www.ball-ray.net/ American Heart Association: weddingcandid.com Document Released: 07/12/2003 Document Revised: 07/14/2011  Document Reviewed: 07/07/2007 [facility] Patient Information 2014 [place], [place]. Smoking Cessation, Tips for Success YOU CAN QUIT SMOKING If you are ready to quit smoking, congratulations! You have chosen to help yourself be  healthier. Cigarettes bring nicotine, tar, carbon monoxide, and other irritants into your body. Your lungs, heart, and blood vessels will be able to work better without these poisons. There are many different ways to quit smoking. Nicotine gum, nicotine patches, a nicotine inhaler, or nicotine nasal spray can help with physical craving. Hypnosis, support groups, and medicines help break the habit of smoking. Here are some tips to help you quit for good.  Throw away all cigarettes.  Clean and remove all ashtrays from your home, work, and car.  On a card, write down your reasons for quitting. Carry the card with you and read it when you get the urge to smoke.  Cleanse your body of nicotine. Drink enough water and fluids to keep your urine clear or pale yellow. Do this after quitting to flush the nicotine from your body.  Learn to predict your moods. Do not let a bad situation be your excuse to have a cigarette. Some situations in your life might tempt you into wanting a cigarette.  Never have "just one" cigarette. It leads to wanting another and another. Remind yourself of your decision to quit.  Change habits associated with smoking. If you smoked while driving or when feeling stressed, try other activities to replace smoking. Stand up when drinking your coffee. Brush your teeth after eating. Sit in a different chair when you read the paper. Avoid alcohol while trying to quit, and try to drink fewer caffeinated beverages. Alcohol and caffeine may urge you to smoke.  Avoid foods and drinks that can trigger a desire to smoke, such as sugary or spicy foods and alcohol.  Ask people who smoke not to smoke around you.  Have something planned to do right after eating or having a cup of coffee. Take a walk or exercise to perk you up. This will help to keep you from overeating.  Try a relaxation exercise to calm you down and decrease your stress. Remember, you may be tense and nervous for the first 2  weeks after you quit, but this will pass.  Find new activities to keep your hands busy. Play with a pen, coin, or rubber band. Doodle or draw things on paper.  Brush your teeth right after eating. This will help cut down on the craving for the taste of tobacco after meals. You can try mouthwash, too.  Use oral substitutes, such as lemon drops, carrots, a cinnamon stick, or chewing gum, in place of cigarettes. Keep them handy so they are available when you have the urge to smoke.  When you have the urge to smoke, try deep breathing.  Designate your home as a nonsmoking area.  If you are a heavy smoker, ask your caregiver about a prescription for nicotine chewing gum. It can ease your withdrawal from nicotine.  Reward yourself. Set aside the cigarette money you save and buy yourself something nice.  Look for support from others. Join a support group or smoking cessation program. Ask someone at home or at work to help you with your plan to quit smoking.  Always ask yourself, "Do I need this cigarette or is this just a reflex?" Tell yourself, "Today, I choose not to smoke," or "I do not want to smoke." You are reminding yourself of your decision to quit, even if  you do smoke a cigarette. HOW WILL I FEEL WHEN I QUIT SMOKING?  The benefits of not smoking start within days of quitting.  You may have symptoms of withdrawal because your body is used to nicotine (the addictive substance in cigarettes). You may crave cigarettes, be irritable, feel very hungry, cough often, get headaches, or have difficulty concentrating.  The withdrawal symptoms are only temporary. They are strongest when you first quit but will go away within 10 to 14 days.  When withdrawal symptoms occur, stay in control. Think about your reasons for quitting. Remind yourself that these are signs that your body is healing and getting used to being without cigarettes.  Remember that withdrawal symptoms are easier to treat than the  major diseases that smoking can cause.  Even after the withdrawal is over, expect periodic urges to smoke. However, these cravings are generally short-lived and will go away whether you smoke or not. Do not smoke!  If you relapse and smoke again, do not lose hope. Most smokers quit 3 times before they are successful.  If you relapse, do not give up! Plan ahead and think about what you will do the next time you get the urge to smoke. LIFE AS A NONSMOKER: MAKE IT FOR A MONTH, MAKE IT FOR LIFE Day 1: Hang this page where you will see it every day. Day 2: Get rid of all ashtrays, matches, and lighters. Day 3: Drink water. Breathe deeply between sips. Day 4: Avoid places with smoke-filled air, such as bars, clubs, or the smoking section of restaurants. Day 5: Keep track of how much money you save by not smoking. Day 6: Avoid boredom. Keep a good book with you or go to the movies. Day 7: Reward yourself! One week without smoking! Day 8: Make a dental appointment to get your teeth cleaned. Day 9: Decide how you will turn down a cigarette before it is offered to you. Day 10: Review your reasons for quitting. Day 11: Distract yourself. Stay active to keep your mind off smoking and to relieve tension. Take a walk, exercise, read a book, do a crossword puzzle, or try a new hobby. Day 12: Exercise. Get off the bus before your stop or use stairs instead of escalators. Day 13: Call on friends for support and encouragement. Day 14: Reward yourself! Two weeks without smoking! Day 15: Practice deep breathing exercises. Day 16: Bet a friend that you can stay a nonsmoker. Day 17: Ask to sit in nonsmoking sections of restaurants. Day 18: Hang up "No Smoking" signs. Day 19: Think of yourself as a nonsmoker. Day 20: Each morning, tell yourself you will not smoke. Day 21: Reward yourself! Three weeks without smoking! Day 22: Think of smoking in negative ways. Remember how it stains your teeth, gives you bad  breath, and leaves you short of breath. Day 23: Eat a nutritious breakfast. Day 24:Do not relive your days as a smoker. Day 25: Hold a pencil in your hand when talking on the telephone. Day 26: Tell all your friends you do not smoke. Day 27: Think about how much better food tastes. Day 28: Remember, one cigarette is one too many. Day 29: Take up a hobby that will keep your hands busy. Day 30: Congratulations! One month without smoking! Give yourself a big reward. Your caregiver can direct you to community resources or hospitals for support, which may include:  Group support.  Education.  Hypnosis.  Subliminal therapy. Document Released: 01/18/2004 Document Revised: 07/14/2011  Document Reviewed: 02/05/2009 [facility] Patient Information 2014 [place], [place]. Smoking and Your Digestive System Cigarette smoking causes many life-threatening diseases. These include lung cancer, other cancers, emphysema, and heart disease. About 430,000 deaths each year are directly caused by cigarette smoking. Smoking results in disease-causing changes in all parts of the body. This includes the digestive system. This can cause serious effects, since the digestive system converts foods into nutrients the body needs to live. Smoking has been shown to have harmful effects on all parts of the digestive system. It adds to common disorders, such as heartburn and peptic ulcers. It also increases the risk of Crohn's disease, and possibly gallstones. Smoking seems to affect the liver by changing the way it handles drugs and alcohol and removes them. In fact, there seems to be enough evidence to stop smoking based solely on digestive distress. Some of the harmful effects of smoking are:  Heartburn (acid reflux).  Heartburn happens when acidic juices from the stomach splash into the esophagus, which has a more sensitive and less acid-resistant lining than the stomach. Normally, a muscular valve at the lower end of the  esophagus keeps out the acid solution in the stomach. Smoking decreases the strength of the esophageal valve and its ability to keep out acidic stomach contents. This allows stomach acid reflux, or flow backward into the esophagus.  Smoking also seems to promote the movement of bile salts from the intestine to the stomach. This makes stomach acids more harmful.  A peptic ulcer is an open sore in the lining of the stomach or duodenum (first part of the small intestine). The exact cause of ulcers is not known. A link between smoking cigarettes and ulcers, especially duodenal ulcers, does exist. Ulcers are more likely to occur, less likely to heal, and more likely to cause death in smokers than in nonsmokers.  Some research suggests that smoking might increase a person's risk of infection with the bacterium Helicobacter pylori (H. pylori). Most peptic ulcers are caused by this bacterium.  Stomach acid is also important in causing ulcers. Normally, most of this acid is buffered (neutralized) by the food we eat. Most of the unbuffered acid that enters the duodenum is quickly neutralized by sodium bicarbonate. This is a naturally occurring alkali, produced by the pancreas. Some studies show that smoking reduces the bicarbonate produced by the pancreas. This interferes with the neutralization of acid in the duodenum. Other studies suggest that chronic cigarette smoking may increase the amount of acid produced by the stomach.  Whatever causes the link between smoking and ulcers, two points have been repeatedly shown. People who smoke are more likely to develop an ulcer, especially a duodenal ulcer. Ulcers in smokers are less likely to heal quickly in response to otherwise effective treatment. This research strongly suggests that a person with an ulcer should stop smoking.  The liver is an important organ with many tasks. One task of the liver is to prepare drugs, alcohol, and other toxins for elimination  (removal) from the body. There is evidence that smoking alters the ability of the liver to effectively handle such substances. In some cases, this may influence the dose of medicine needed to treat an illness. Some research suggests that smoking can aggravate and speed up the course of liver disease caused by excessive alcohol intake.  Studies have shown that smokers have weaker or less frequent stomach contractions while smoking, which can cause less efficient digestion.  Crohn's disease causes inflammation deep in the lining of  the intestine. The disease causes pain and diarrhea. It usually affects the small intestine, but it can occur anywhere in the digestive tract. Research shows that current and former smokers have a higher risk of developing Crohn's disease than nonsmokers. Among people with the disease, smoking is linked with a higher rate of relapse, repeat surgery, and immunosuppressive treatment. In all areas, the risk for women who are current or former smokers is slightly higher than for men. Why smoking increases the risk of Crohn's disease is unknown.  Several studies suggest that smoking may increase the risk of developing gallstones. The risk may be higher for women. Research results on this topic are not consistent. More studies are needed.  Oral (lip and mouth) cancer and cancer of the pharynx (throat) and the esophagus are caused by smoking. Smoking may be associated with pancreatic cancer.  Some of the effects of smoking on the digestive system seem to be short-lived. For example, the effect of smoking on bicarbonate production by the pancreas does not appear to last. Half an hour after smoking, the production of bicarbonate returns to normal. The effects of smoking on how the liver handles drugs also disappear when a person stops smoking. However, people who no longer smoke still remain at risk for Crohn's disease. Document Released: 04/03/2004 Document Revised: 07/14/2011 Document  Reviewed: 02/05/2009 [facility] Patient Information 2014 [place], [place].

## 2012-10-11 NOTE — Progress Notes (Signed)
Patient here to establish primary doctor History of DM

## 2012-10-12 ENCOUNTER — Encounter (HOSPITAL_COMMUNITY): Payer: Self-pay | Admitting: Emergency Medicine

## 2012-10-12 ENCOUNTER — Ambulatory Visit (INDEPENDENT_AMBULATORY_CARE_PROVIDER_SITE_OTHER): Payer: Medicaid Other | Admitting: Cardiology

## 2012-10-12 ENCOUNTER — Encounter: Payer: Self-pay | Admitting: Cardiology

## 2012-10-12 ENCOUNTER — Emergency Department (HOSPITAL_COMMUNITY)
Admission: EM | Admit: 2012-10-12 | Discharge: 2012-10-12 | Disposition: A | Discharge status: Home / Self Care | Payer: Medicaid Other | Attending: Emergency Medicine | Admitting: Emergency Medicine

## 2012-10-12 VITALS — BP 134/82 | HR 64 | Ht 72.0 in | Wt 227.4 lb

## 2012-10-12 DIAGNOSIS — E119 Type 2 diabetes mellitus without complications: Secondary | ICD-10-CM | POA: Insufficient documentation

## 2012-10-12 DIAGNOSIS — Z9889 Other specified postprocedural states: Secondary | ICD-10-CM

## 2012-10-12 DIAGNOSIS — E785 Hyperlipidemia, unspecified: Secondary | ICD-10-CM

## 2012-10-12 DIAGNOSIS — I10 Essential (primary) hypertension: Secondary | ICD-10-CM

## 2012-10-12 DIAGNOSIS — F319 Bipolar disorder, unspecified: Secondary | ICD-10-CM | POA: Insufficient documentation

## 2012-10-12 DIAGNOSIS — Z87891 Personal history of nicotine dependence: Secondary | ICD-10-CM | POA: Insufficient documentation

## 2012-10-12 DIAGNOSIS — Z79899 Other long term (current) drug therapy: Secondary | ICD-10-CM | POA: Insufficient documentation

## 2012-10-12 DIAGNOSIS — Z760 Encounter for issue of repeat prescription: Secondary | ICD-10-CM

## 2012-10-12 DIAGNOSIS — F17200 Nicotine dependence, unspecified, uncomplicated: Secondary | ICD-10-CM

## 2012-10-12 DIAGNOSIS — Z8739 Personal history of other diseases of the musculoskeletal system and connective tissue: Secondary | ICD-10-CM | POA: Insufficient documentation

## 2012-10-12 DIAGNOSIS — Z7982 Long term (current) use of aspirin: Secondary | ICD-10-CM | POA: Insufficient documentation

## 2012-10-12 MED ORDER — ATORVASTATIN CALCIUM 40 MG PO TABS: 40.0000 mg | ORAL_TABLET | Freq: Every day | ORAL | Status: DC

## 2012-10-12 MED ORDER — OXYCODONE-ACETAMINOPHEN 10-325 MG PO TABS: 1.0000 | ORAL_TABLET | ORAL | Status: DC | PRN

## 2012-10-12 NOTE — Assessment & Plan Note (Signed)
Discussed importance of controlling his diabetes.

## 2012-10-12 NOTE — Assessment & Plan Note (Signed)
Discussed with Pt.

## 2012-10-12 NOTE — ED Notes (Signed)
Pt states he has had generalized body pains for the past 80yrs. Pt states he is waiting for new PCP to refer to pain clinic and is here for a refill on Oxycodone.

## 2012-10-12 NOTE — Patient Instructions (Signed)
We would like you to continue Lipitor daily for your cholesterol.   Your heart on the heart cath was good, only small amount of disease that does not block the arteries.  We would like to keep it that way.   Eat Healthy, continue not to smoke, that is very important.  Exercise by walking.  Control you diabetes and high blood pressure.  We will see you back in 6 months and check your cholesterol at that time.  Follow up with Dr. Royann Shivers in 6 months.

## 2012-10-12 NOTE — Assessment & Plan Note (Signed)
stable °

## 2012-10-12 NOTE — Assessment & Plan Note (Signed)
Pt stated he stopped tobacco yesterday, cold [place]. Congratulated and encouraged him not to resume.

## 2012-10-12 NOTE — Progress Notes (Signed)
10/12/2012   PCP: Standley Dakins, MD   Chief Complaint  Patient presents with  . Follow-up    post-cath 5/13; minor CP; SOB on exertion occasionally    Primary Cardiologist: Dr. Royann Shivers  HPI: 52 y/o diabetic, HTN male who smoked until yesterday- was hopitalized with complaints of Lt sided chest pain for 2-3 months. His symptoms radiate to his Lt posterior shoulder. He said it was both exertional and at rest. He admited to SOB but denies nausea or diaphoresis. It was relieved with rest when he is walking, and sitting up when he has it at night. 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 He lives with his 25y/u daughter. His brother had an LVAD at [facility] and a cardiac transplant at [facility] per the pt. He finally became concerned that his symptoms have persisted and went to the ER for further evaluation. Negative for an MI.  Cardiac cath with minimal disease.  But with risk factors we recommended statin, stopping tobacco and control of diabetes and HTN.  He is here for follow up.  Occ. Chest pain that is not cardiac.  We discussed no tobacco, exercise, and eating healthy for his heart and diabetes.    No Known Allergies  Current Outpatient Prescriptions  Medication Sig Dispense Refill  . aspirin 81 MG tablet Take 81 mg by mouth daily.      Marland Kitchen escitalopram (LEXAPRO) 20 MG tablet Take 20 mg by mouth daily.       Marland Kitchen glipiZIDE (GLUCOTROL) 5 MG tablet Take 5 mg by mouth 2 (two) times daily before a meal.      . lurasidone (LATUDA) 40 MG TABS Take 40 mg by mouth daily with breakfast.      . metFORMIN (GLUCOPHAGE) 1000 MG tablet Take 1,000 mg by mouth 2 (two) times daily with a meal.      . oxyCODONE-acetaminophen (PERCOCET) 10-325 MG per tablet Take 1 tablet by mouth every 4 (four) hours as needed for pain.      . verapamil (VERELAN PM) 360 MG 24 hr capsule Take 360 mg by mouth daily.      Marland Kitchen atorvastatin (LIPITOR) 40 MG tablet Take 1 tablet (40 mg total) by mouth daily at 6 PM.   30 tablet  6   No current facility-administered medications for this visit.    Past Medical History  Diagnosis Date  . Hypertension   . Depression   . Diabetes mellitus without complication   . S/P cardiac catheterization, 09/14/12 with mild non obstructive CAD and EF of 55% 09/15/2012  . DJD (degenerative joint disease) of cervical spine     on disability  . Bipolar disorder     Past Surgical History  Procedure Laterality Date  . Back surgery      ZOX:[id number]:UJ colds or fevers, no weight changes Skin:no rashes or ulcers HEENT:no blurred vision, no congestion CV:see HPI PUL:see HPI GI:no diarrhea constipation or melena, no indigestion GU:no hematuria, no dysuria MS:no joint pain, no claudication Neuro:no syncope, no lightheadedness Endo:+ diabetes, no thyroid disease  PHYSICAL EXAM BP 134/82  Pulse 64  Ht 6' (1.829 m)  Wt 227 lb 6.4 oz (103.148 kg)  BMI 30.83 kg/m2 General:Pleasant affect, NAD Skin:Warm and dry, brisk capillary refill HEENT:normocephalic, sclera clear, mucus membranes moist Neck:supple, no JVD, no bruits  Heart:S1S2 RRR without murmur, gallup, rub or click Lungs:clear without rales, rhonchi, or wheezes WJX:BJYN, non tender, + BS, do not palpate liver spleen or masses Ext:no lower  ext edema, 2+ pedal pulses, 2+ radial pulses Neuro:alert and oriented, MAE, follows commands, + facial symmetry   ASSESSMENT AND PLAN S/P cardiac catheterization, 09/14/12 with mild non obstructive CAD and EF of 55% Discussed with Pt.  Dyslipidemia- (LDL 190 in 2011) LDL 139 with hospitalization.  Discussed importance to use Lipitor and eat heart healthy.  We are trying to prevent increased CAD.  Smoker Pt stated he stopped tobacco yesterday, cold [place]. Congratulated and encouraged him not to resume.  HTN (hypertension), benign stable  DM type 2 (diabetes mellitus, type 2) Discussed importance of controlling his diabetes.  Bipolar disorder, unspecified Normal  affect today.   Prior to leaving the office the patient wanted [place] to fill his hydrocodone to inform the patient we would not be able to do that he would need to see his primary care provider.  He'll follow with Dr.Croitoru in 6 months at which time we will check his lipid panel

## 2012-10-12 NOTE — Assessment & Plan Note (Signed)
Normal affect today.

## 2012-10-12 NOTE — ED Provider Notes (Signed)
History     CSN: [id number]  Arrival date & time 10/12/12  1204   First MD Initiated Contact with Patient 10/12/12 1210      Chief Complaint  Patient presents with  . Medication Refill    (Consider location/radiation/quality/duration/timing/severity/associated sxs/prior treatment) HPI  Patient is a 52 yo M PMHx significant for chronic pain presents to the ED requesting pain medication refill for his Percocet. Pt states he is at a new PCP practice and states they will no longer refill his narcotic pain medication and was given follow up with the Pain Clinic. Pt states he has not been contacted by Pain Clinic and was not given a phone number to contact them.   Past Medical History  Diagnosis Date  . Hypertension   . Depression   . Diabetes mellitus without complication   . S/P cardiac catheterization, 09/14/12 with mild non obstructive CAD and EF of 55% 09/15/2012  . DJD (degenerative joint disease) of cervical spine     on disability  . Bipolar disorder     Past Surgical History  Procedure Laterality Date  . Back surgery      Family History  Problem Relation Age of Onset  . Heart disease Brother     has had heart transplant  . Diabetes Mother   . Cancer Mother   . Hypertension Mother   . Cancer Brother   . Diabetes Brother   . Cancer Brother     History  Substance Use Topics  . Smoking status: Former Smoker -- 0.25 packs/day for 20 years    Types: Cigarettes    Quit date: 10/11/2012  . Smokeless tobacco: Not on file  . Alcohol Use: No     Comment: 1x week      Review of Systems  Constitutional: Negative for fever and chills.  Respiratory: Negative for shortness of breath.   Cardiovascular: Negative for chest pain.    Allergies  Review of patient's allergies indicates no known allergies.  Home Medications   Current Outpatient Rx  Name  Route  Sig  Dispense  Refill  . aspirin 81 MG tablet   Oral   Take 81 mg by mouth daily.         Marland Kitchen  escitalopram (LEXAPRO) 20 MG tablet   Oral   Take 20 mg by mouth daily.          Marland Kitchen glipiZIDE (GLUCOTROL) 5 MG tablet   Oral   Take 5 mg by mouth 2 (two) times daily before a meal.         . lurasidone (LATUDA) 40 MG TABS   Oral   Take 40 mg by mouth daily with breakfast.         . metFORMIN (GLUCOPHAGE) 1000 MG tablet   Oral   Take 1,000 mg by mouth 2 (two) times daily with a meal.         . oxyCODONE-acetaminophen (PERCOCET) 10-325 MG per tablet   Oral   Take 1 tablet by mouth every 4 (four) hours as needed for pain.         . verapamil (VERELAN PM) 360 MG 24 hr capsule   Oral   Take 360 mg by mouth daily.         Marland Kitchen atorvastatin (LIPITOR) 40 MG tablet   Oral   Take 1 tablet (40 mg total) by mouth daily at 6 PM.   30 tablet   6   . oxyCODONE-acetaminophen (PERCOCET) 10-325 MG per tablet  Oral   Take 1 tablet by mouth every 4 (four) hours as needed for pain.   5 tablet   0     BP 148/85  Pulse 64  Temp(Src) 98.2 F (36.8 C) (Oral)  Resp 18  SpO2 99%  Physical Exam  Constitutional: He is oriented to person, place, and time. He appears well-developed and well-nourished. No distress.  HENT:  Head: Normocephalic and atraumatic.  Eyes: Conjunctivae are normal.  Neck: Neck supple.  Neurological: He is alert and oriented to person, place, and time.  Skin: Skin is warm and dry. He is not diaphoretic.  Psychiatric: He has a normal mood and affect.    ED Course  Procedures (including critical care time)   Labs Reviewed - No data to display No results found.   1. Medication refill       MDM  Patient was advised that the ED does not manage chronic pain and will not refill sustained narcotic pain medication prescriptions for patients. Patient was sent home with extremely limited Percocets and given the number for chronic pain clinic to contact them directly. Pt was advised to have another discussion with his PCP regarding pain management until he  was accepted into a pain clinic. Patient agreeable to plan. Patient is stable at time of discharge          Jeannetta Ellis, PA-C 10/12/12 2003

## 2012-10-12 NOTE — Assessment & Plan Note (Signed)
LDL 139 with hospitalization.  Discussed importance to use Lipitor and eat heart healthy.  We are trying to prevent increased CAD.

## 2012-10-14 NOTE — ED Provider Notes (Signed)
Medical screening examination/treatment/procedure(s) were performed by non-physician practitioner and as supervising physician I was immediately available for consultation/collaboration.  Greenley Martone R. Cheyanna Strick, MD 10/14/12 2349 

## 2012-10-25 ENCOUNTER — Ambulatory Visit: Payer: Medicaid Other | Attending: Family Medicine

## 2012-10-25 NOTE — Progress Notes (Unsigned)
Pt bp check 161/89. Pt reports of taking Verapamil HCL 360mg  daily. Also states he is under a lot of stress but denies headache,blurred vision or dizziness. Made office visit appt for R shoulder check

## 2012-10-26 ENCOUNTER — Telehealth: Payer: Self-pay | Admitting: Family Medicine

## 2012-10-26 NOTE — Telephone Encounter (Signed)
Pt was here today and he is aware of the pain clinic decision .

## 2012-10-26 NOTE — Telephone Encounter (Signed)
Pt referred to Arna Medici, who explained reason pt denied at pain clinic.

## 2012-10-26 NOTE — Telephone Encounter (Signed)
Pt says does not understand why not being referred to pain clinic.

## 2012-11-04 ENCOUNTER — Encounter (HOSPITAL_COMMUNITY): Payer: Self-pay | Admitting: Emergency Medicine

## 2012-11-04 ENCOUNTER — Emergency Department (HOSPITAL_COMMUNITY)
Admission: EM | Admit: 2012-11-04 | Discharge: 2012-11-05 | Disposition: A | Discharge status: Other Acute Inpatient Hospital | Payer: No Typology Code available for payment source | Attending: Emergency Medicine | Admitting: Emergency Medicine

## 2012-11-04 DIAGNOSIS — E119 Type 2 diabetes mellitus without complications: Secondary | ICD-10-CM | POA: Insufficient documentation

## 2012-11-04 DIAGNOSIS — F1910 Other psychoactive substance abuse, uncomplicated: Secondary | ICD-10-CM

## 2012-11-04 DIAGNOSIS — I10 Essential (primary) hypertension: Secondary | ICD-10-CM | POA: Insufficient documentation

## 2012-11-04 DIAGNOSIS — Z79899 Other long term (current) drug therapy: Secondary | ICD-10-CM | POA: Insufficient documentation

## 2012-11-04 DIAGNOSIS — Z7982 Long term (current) use of aspirin: Secondary | ICD-10-CM | POA: Insufficient documentation

## 2012-11-04 DIAGNOSIS — Z87891 Personal history of nicotine dependence: Secondary | ICD-10-CM | POA: Insufficient documentation

## 2012-11-04 DIAGNOSIS — F32A Depression, unspecified: Secondary | ICD-10-CM

## 2012-11-04 DIAGNOSIS — F3289 Other specified depressive episodes: Secondary | ICD-10-CM | POA: Insufficient documentation

## 2012-11-04 DIAGNOSIS — F1010 Alcohol abuse, uncomplicated: Secondary | ICD-10-CM

## 2012-11-04 DIAGNOSIS — F329 Major depressive disorder, single episode, unspecified: Secondary | ICD-10-CM | POA: Insufficient documentation

## 2012-11-04 DIAGNOSIS — Z8739 Personal history of other diseases of the musculoskeletal system and connective tissue: Secondary | ICD-10-CM | POA: Insufficient documentation

## 2012-11-04 LAB — COMPREHENSIVE METABOLIC PANEL
ALT: 25 U/L (ref 0–53)
CO2: 21 mEq/L (ref 19–32)
Calcium: 9.1 mg/dL (ref 8.4–10.5)
Chloride: 106 mEq/L (ref 96–112)
Creatinine, Ser: 1.09 mg/dL (ref 0.50–1.35)
GFR calc Af Amer: 89 mL/min — ABNORMAL LOW (ref >90)
GFR calc non Af Amer: 77 mL/min — ABNORMAL LOW (ref >90)
Glucose, Bld: 265 mg/dL — ABNORMAL HIGH (ref 70–99)
Sodium: 140 mEq/L (ref 135–145)
Total Bilirubin: 0.3 mg/dL (ref 0.3–1.2)

## 2012-11-04 LAB — CBC
Hemoglobin: 15 g/dL (ref 13.0–17.0)
MCH: 30.4 pg (ref 26.0–34.0)
MCV: 85.6 fL (ref 78.0–100.0)
RBC: 4.94 MIL/uL (ref 4.22–5.81)
WBC: 5.7 10*3/uL (ref 4.0–10.5)

## 2012-11-04 LAB — RAPID URINE DRUG SCREEN, HOSP PERFORMED
Amphetamines: NOT DETECTED
Opiates: NOT DETECTED
Tetrahydrocannabinol: NOT DETECTED

## 2012-11-04 MED ORDER — LURASIDONE HCL 40 MG PO TABS
40.0000 mg | ORAL_TABLET | Freq: Every day | ORAL | Status: DC
  Administered 2012-11-05: 40 mg via ORAL
  Filled 2012-11-04 (×2): qty 1

## 2012-11-04 MED ORDER — NICOTINE 21 MG/24HR TD PT24
21.0000 mg | MEDICATED_PATCH | Freq: Every day | TRANSDERMAL | Status: DC
  Administered 2012-11-04: 21 mg via TRANSDERMAL
  Filled 2012-11-04 (×2): qty 1

## 2012-11-04 MED ORDER — THIAMINE HCL 100 MG/ML IJ SOLN: 100.0000 mg | Freq: Every day | INTRAMUSCULAR | Status: DC

## 2012-11-04 MED ORDER — ACETAMINOPHEN 325 MG PO TABS: 650.0000 mg | ORAL_TABLET | ORAL | Status: DC | PRN

## 2012-11-04 MED ORDER — ATORVASTATIN CALCIUM 40 MG PO TABS
40.0000 mg | ORAL_TABLET | Freq: Every day | ORAL | Status: DC
  Administered 2012-11-04: 40 mg via ORAL
  Filled 2012-11-04 (×2): qty 1

## 2012-11-04 MED ORDER — VITAMIN B-1 100 MG PO TABS
100.0000 mg | ORAL_TABLET | Freq: Every day | ORAL | Status: DC
  Administered 2012-11-04 – 2012-11-05 (×2): 100 mg via ORAL
  Filled 2012-11-04 (×2): qty 1

## 2012-11-04 MED ORDER — ASPIRIN 81 MG PO CHEW
81.0000 mg | CHEWABLE_TABLET | Freq: Every day | ORAL | Status: DC
  Administered 2012-11-04 – 2012-11-05 (×2): 81 mg via ORAL
  Filled 2012-11-04 (×3): qty 1

## 2012-11-04 MED ORDER — VERAPAMIL HCL ER 180 MG PO TBCR
360.0000 mg | EXTENDED_RELEASE_TABLET | Freq: Every day | ORAL | Status: DC
  Administered 2012-11-04 – 2012-11-05 (×2): 360 mg via ORAL
  Filled 2012-11-04 (×2): qty 2

## 2012-11-04 MED ORDER — ALUM & MAG HYDROXIDE-SIMETH 200-200-20 MG/5ML PO SUSP: 30.0000 mL | ORAL | Status: DC | PRN

## 2012-11-04 MED ORDER — LORAZEPAM 1 MG PO TABS
0.0000 mg | ORAL_TABLET | Freq: Four times a day (QID) | ORAL | Status: DC
  Administered 2012-11-04: 2 mg via ORAL
  Filled 2012-11-04: qty 2

## 2012-11-04 MED ORDER — ONDANSETRON HCL 4 MG PO TABS: 4.0000 mg | ORAL_TABLET | Freq: Three times a day (TID) | ORAL | Status: DC | PRN

## 2012-11-04 MED ORDER — ESCITALOPRAM OXALATE 10 MG PO TABS
20.0000 mg | ORAL_TABLET | Freq: Every day | ORAL | Status: DC
  Administered 2012-11-04 – 2012-11-05 (×2): 20 mg via ORAL
  Filled 2012-11-04: qty 2
  Filled 2012-11-04: qty 1

## 2012-11-04 MED ORDER — GLIPIZIDE 5 MG PO TABS
5.0000 mg | ORAL_TABLET | Freq: Two times a day (BID) | ORAL | Status: DC
  Administered 2012-11-05: 5 mg via ORAL
  Filled 2012-11-04 (×3): qty 1

## 2012-11-04 MED ORDER — ZOLPIDEM TARTRATE 5 MG PO TABS: 5.0000 mg | ORAL_TABLET | Freq: Every evening | ORAL | Status: DC | PRN

## 2012-11-04 MED ORDER — LORAZEPAM 2 MG/ML IJ SOLN: 1.0000 mg | Freq: Four times a day (QID) | INTRAMUSCULAR | Status: DC | PRN

## 2012-11-04 MED ORDER — IBUPROFEN 200 MG PO TABS: 600.0000 mg | ORAL_TABLET | Freq: Three times a day (TID) | ORAL | Status: DC | PRN

## 2012-11-04 MED ORDER — LORAZEPAM 1 MG PO TABS: 1.0000 mg | ORAL_TABLET | Freq: Four times a day (QID) | ORAL | Status: DC | PRN

## 2012-11-04 MED ORDER — LORAZEPAM 1 MG PO TABS: 0.0000 mg | ORAL_TABLET | Freq: Two times a day (BID) | ORAL | Status: DC

## 2012-11-04 MED ORDER — FOLIC ACID 1 MG PO TABS
1.0000 mg | ORAL_TABLET | Freq: Every day | ORAL | Status: DC
  Administered 2012-11-04 – 2012-11-05 (×2): 1 mg via ORAL
  Filled 2012-11-04 (×2): qty 1

## 2012-11-04 MED ORDER — ADULT MULTIVITAMIN W/MINERALS CH
1.0000 | ORAL_TABLET | Freq: Every day | ORAL | Status: DC
  Administered 2012-11-04 – 2012-11-05 (×2): 1 via ORAL
  Filled 2012-11-04 (×3): qty 1

## 2012-11-04 MED ORDER — METFORMIN HCL 500 MG PO TABS
1000.0000 mg | ORAL_TABLET | Freq: Two times a day (BID) | ORAL | Status: DC
  Administered 2012-11-05: 1000 mg via ORAL
  Filled 2012-11-04 (×3): qty 2
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
## 2012-11-04 NOTE — ED Notes (Signed)
Pt's belonging bags in locker # 37. Pt has two pocket knives with security, pt will be able to receive them back upon discharge.

## 2012-11-04 NOTE — ED Notes (Signed)
Pt has been wanded by security and taken to back by Kendal Hymen EMT with pt's belongings

## 2012-11-04 NOTE — ED Notes (Signed)
Pt advised of medications from home cannot be taken and there are no phone calls after 9 pm.  Pt also wanting to know if he can go across street to [facility],  Pt is advised he must have ACT consultation and then there will be decision on placement

## 2012-11-04 NOTE — ED Notes (Signed)
Pt reports drinking a pint of vodka and a 5th of alcohol. Pt reports struggling with alcoholism for his entire adult life. Pt is requesting detox. Pt reports feelings of SI, however denies HI.

## 2012-11-04 NOTE — ED Notes (Signed)
Pt transferred from triage, presents for detox from Alcohol, drinks 1 pint to a 5th of liquor per day.  Admits to cocaine abuse also. Feeling hopeless.  Some SI thoughts noted, no plan.  Denies HI or AV hallucinations.  Pt admits to 2 OD's in the past..  Diag with Schizophrenia, Depression and Bipolar DO.  Pt calm & cooperative at present.

## 2012-11-05 ENCOUNTER — Encounter (HOSPITAL_COMMUNITY): Payer: Self-pay | Admitting: *Deleted

## 2012-11-05 ENCOUNTER — Inpatient Hospital Stay (HOSPITAL_COMMUNITY)
Admission: AD | Admit: 2012-11-05 | Discharge: 2012-11-10 | DRG: 750 | Disposition: A | Discharge status: Home / Self Care | Payer: Medicaid Other | Source: Intra-hospital | Attending: Psychiatry | Admitting: Psychiatry

## 2012-11-05 ENCOUNTER — Encounter (HOSPITAL_COMMUNITY): Payer: Self-pay | Admitting: Registered Nurse

## 2012-11-05 DIAGNOSIS — M47812 Spondylosis without myelopathy or radiculopathy, cervical region: Secondary | ICD-10-CM | POA: Diagnosis present

## 2012-11-05 DIAGNOSIS — F1010 Alcohol abuse, uncomplicated: Secondary | ICD-10-CM

## 2012-11-05 DIAGNOSIS — F1420 Cocaine dependence, uncomplicated: Secondary | ICD-10-CM

## 2012-11-05 DIAGNOSIS — F1910 Other psychoactive substance abuse, uncomplicated: Secondary | ICD-10-CM

## 2012-11-05 DIAGNOSIS — E119 Type 2 diabetes mellitus without complications: Secondary | ICD-10-CM | POA: Diagnosis present

## 2012-11-05 DIAGNOSIS — F411 Generalized anxiety disorder: Secondary | ICD-10-CM | POA: Diagnosis present

## 2012-11-05 DIAGNOSIS — F329 Major depressive disorder, single episode, unspecified: Secondary | ICD-10-CM

## 2012-11-05 DIAGNOSIS — F1020 Alcohol dependence, uncomplicated: Principal | ICD-10-CM

## 2012-11-05 DIAGNOSIS — F200 Paranoid schizophrenia: Secondary | ICD-10-CM

## 2012-11-05 DIAGNOSIS — F17200 Nicotine dependence, unspecified, uncomplicated: Secondary | ICD-10-CM

## 2012-11-05 DIAGNOSIS — F3189 Other bipolar disorder: Secondary | ICD-10-CM | POA: Diagnosis present

## 2012-11-05 DIAGNOSIS — E7800 Pure hypercholesterolemia, unspecified: Secondary | ICD-10-CM | POA: Diagnosis present

## 2012-11-05 DIAGNOSIS — F319 Bipolar disorder, unspecified: Secondary | ICD-10-CM

## 2012-11-05 DIAGNOSIS — I10 Essential (primary) hypertension: Secondary | ICD-10-CM | POA: Diagnosis present

## 2012-11-05 LAB — GLUCOSE, CAPILLARY: Glucose-Capillary: 69 mg/dL — ABNORMAL LOW (ref 70–99)

## 2012-11-05 MED ORDER — MAGNESIUM HYDROXIDE 400 MG/5ML PO SUSP: 30.0000 mL | Freq: Every day | ORAL | Status: DC | PRN

## 2012-11-05 MED ORDER — CHLORDIAZEPOXIDE HCL 25 MG PO CAPS
25.0000 mg | ORAL_CAPSULE | Freq: Every day | ORAL | Status: AC
  Administered 2012-11-10: 25 mg via ORAL
  Filled 2012-11-05: qty 1

## 2012-11-05 MED ORDER — CHLORDIAZEPOXIDE HCL 25 MG PO CAPS
25.0000 mg | ORAL_CAPSULE | ORAL | Status: AC
  Administered 2012-11-08 – 2012-11-09 (×2): 25 mg via ORAL
  Filled 2012-11-05 (×2): qty 1

## 2012-11-05 MED ORDER — ONDANSETRON 4 MG PO TBDP: 4.0000 mg | ORAL_TABLET | Freq: Four times a day (QID) | ORAL | Status: AC | PRN

## 2012-11-05 MED ORDER — CHLORDIAZEPOXIDE HCL 25 MG PO CAPS
25.0000 mg | ORAL_CAPSULE | Freq: Once | ORAL | Status: AC
  Administered 2012-11-05: 25 mg via ORAL
  Filled 2012-11-05: qty 1

## 2012-11-05 MED ORDER — CHLORDIAZEPOXIDE HCL 25 MG PO CAPS
25.0000 mg | ORAL_CAPSULE | Freq: Three times a day (TID) | ORAL | Status: AC
  Administered 2012-11-07 – 2012-11-08 (×2): 25 mg via ORAL
  Filled 2012-11-05 (×4): qty 1

## 2012-11-05 MED ORDER — ATORVASTATIN CALCIUM 40 MG PO TABS
40.0000 mg | ORAL_TABLET | Freq: Every day | ORAL | Status: DC
  Administered 2012-11-05 – 2012-11-10 (×6): 40 mg via ORAL
  Filled 2012-11-05 (×3): qty 1
  Filled 2012-11-05 (×2): qty 14
  Filled 2012-11-05 (×2): qty 1
  Filled 2012-11-05: qty 4

## 2012-11-05 MED ORDER — FOLIC ACID 1 MG PO TABS
1.0000 mg | ORAL_TABLET | Freq: Every day | ORAL | Status: DC
  Administered 2012-11-06 – 2012-11-10 (×5): 1 mg via ORAL
  Filled 2012-11-05 (×7): qty 1

## 2012-11-05 MED ORDER — ASPIRIN 81 MG PO CHEW
81.0000 mg | CHEWABLE_TABLET | Freq: Every day | ORAL | Status: DC
  Administered 2012-11-06 – 2012-11-10 (×5): 81 mg via ORAL
  Filled 2012-11-05 (×2): qty 1
  Filled 2012-11-05 (×2): qty 14
  Filled 2012-11-05 (×3): qty 1

## 2012-11-05 MED ORDER — ESCITALOPRAM OXALATE 20 MG PO TABS
20.0000 mg | ORAL_TABLET | Freq: Every day | ORAL | Status: DC
  Administered 2012-11-06 – 2012-11-10 (×5): 20 mg via ORAL
  Filled 2012-11-05 (×2): qty 14
  Filled 2012-11-05 (×5): qty 1

## 2012-11-05 MED ORDER — GLIPIZIDE 5 MG PO TABS
5.0000 mg | ORAL_TABLET | Freq: Two times a day (BID) | ORAL | Status: DC
  Administered 2012-11-05 – 2012-11-10 (×11): 5 mg via ORAL
  Filled 2012-11-05: qty 1
  Filled 2012-11-05: qty 28
  Filled 2012-11-05: qty 1
  Filled 2012-11-05: qty 28
  Filled 2012-11-05 (×6): qty 1
  Filled 2012-11-05: qty 28
  Filled 2012-11-05 (×4): qty 1
  Filled 2012-11-05: qty 28

## 2012-11-05 MED ORDER — ACETAMINOPHEN 325 MG PO TABS
650.0000 mg | ORAL_TABLET | Freq: Four times a day (QID) | ORAL | Status: DC | PRN
  Administered 2012-11-06 – 2012-11-07 (×2): 650 mg via ORAL

## 2012-11-05 MED ORDER — ADULT MULTIVITAMIN W/MINERALS CH
1.0000 | ORAL_TABLET | Freq: Every day | ORAL | Status: DC
  Administered 2012-11-05 – 2012-11-10 (×6): 1 via ORAL
  Filled 2012-11-05 (×8): qty 1

## 2012-11-05 MED ORDER — HYDROXYZINE HCL 25 MG PO TABS: 25.0000 mg | ORAL_TABLET | Freq: Four times a day (QID) | ORAL | Status: AC | PRN

## 2012-11-05 MED ORDER — ALUM & MAG HYDROXIDE-SIMETH 200-200-20 MG/5ML PO SUSP: 30.0000 mL | ORAL | Status: DC | PRN

## 2012-11-05 MED ORDER — VITAMIN B-1 100 MG PO TABS
100.0000 mg | ORAL_TABLET | Freq: Every day | ORAL | Status: DC
  Administered 2012-11-06 – 2012-11-10 (×5): 100 mg via ORAL
  Filled 2012-11-05 (×7): qty 1

## 2012-11-05 MED ORDER — CHLORDIAZEPOXIDE HCL 25 MG PO CAPS: 25.0000 mg | ORAL_CAPSULE | Freq: Four times a day (QID) | ORAL | Status: AC | PRN

## 2012-11-05 MED ORDER — LOPERAMIDE HCL 2 MG PO CAPS: 2.0000 mg | ORAL_CAPSULE | ORAL | Status: AC | PRN

## 2012-11-05 MED ORDER — THIAMINE HCL 100 MG/ML IJ SOLN: 100.0000 mg | Freq: Once | INTRAMUSCULAR | Status: DC

## 2012-11-05 MED ORDER — CHLORDIAZEPOXIDE HCL 25 MG PO CAPS
25.0000 mg | ORAL_CAPSULE | Freq: Four times a day (QID) | ORAL | Status: AC
  Administered 2012-11-05 – 2012-11-07 (×6): 25 mg via ORAL
  Filled 2012-11-05 (×5): qty 1

## 2012-11-05 NOTE — Progress Notes (Signed)
Adult Psychoeducational Group Note  Date:  11/05/2012 Time:  8:00PM Group Topic/Focus:  Wrap-Up Group:   The focus of this group is to help patients review their daily goal of treatment and discuss progress on daily workbooks.  Participation Level:  Did Not Attend   Additional Comments: Pt. Didn't attend group.   Bing Plume D 11/05/2012, 8:14 PM

## 2012-11-05 NOTE — Progress Notes (Signed)
Report received from [facility]. Writer entered patients room and observed her lying in bed asleep with eyes closed, respirations even and unlabored, no distress noted, safety maintained on unit, will continue to monitor with 15 min checks.

## 2012-11-05 NOTE — ED Provider Notes (Signed)
1220 pm pt alert pleasant cooperative gcs  15 ambulates without difficulty. Stable for transfer to [facility]. Results for orders placed during the hospital encounter of 11/04/12  ACETAMINOPHEN LEVEL      Result Value Range   Acetaminophen (Tylenol), Serum <15.0  10 - 30 ug/mL  CBC      Result Value Range   WBC 5.7  4.0 - 10.5 K/uL   RBC 4.94  4.22 - 5.81 MIL/uL   Hemoglobin 15.0  13.0 - 17.0 g/dL   HCT 62.9  52.8 - 41.3 %   MCV 85.6  78.0 - 100.0 fL   MCH 30.4  26.0 - 34.0 pg   MCHC 35.5  30.0 - 36.0 g/dL   RDW 24.4  01.0 - 27.2 %   Platelets 274  150 - 400 K/uL  COMPREHENSIVE METABOLIC PANEL      Result Value Range   Sodium 140  135 - 145 mEq/L   Potassium 3.8  3.5 - 5.1 mEq/L   Chloride 106  96 - 112 mEq/L   CO2 21  19 - 32 mEq/L   Glucose, Bld 265 (*) 70 - 99 mg/dL   BUN 11  6 - 23 mg/dL   Creatinine, Ser 5.36  0.50 - 1.35 mg/dL   Calcium 9.1  8.4 - 64.4 mg/dL   Total Protein 7.6  6.0 - 8.3 g/dL   Albumin 3.9  3.5 - 5.2 g/dL   AST 24  0 - 37 U/L   ALT 25  0 - 53 U/L   Alkaline Phosphatase 61  39 - 117 U/L   Total Bilirubin 0.3  0.3 - 1.2 mg/dL   GFR calc non Af Amer 77 (*) >90 mL/min   GFR calc Af Amer 89 (*) >90 mL/min  ETHANOL      Result Value Range   Alcohol, Ethyl (B) 86 (*) 0 - 11 mg/dL  SALICYLATE LEVEL      Result Value Range   Salicylate Lvl <2.0 (*) 2.8 - 20.0 mg/dL  URINE RAPID DRUG SCREEN (HOSP PERFORMED)      Result Value Range   Opiates NONE DETECTED  NONE DETECTED   Cocaine POSITIVE (*) NONE DETECTED   Benzodiazepines NONE DETECTED  NONE DETECTED   Amphetamines NONE DETECTED  NONE DETECTED   Tetrahydrocannabinol NONE DETECTED  NONE DETECTED   Barbiturates NONE DETECTED  NONE DETECTED  GLUCOSE, CAPILLARY      Result Value Range   Glucose-Capillary 190 (*) 70 - 99 mg/dL  GLUCOSE, CAPILLARY      Result Value Range   Glucose-Capillary 138 (*) 70 - 99 mg/dL   No results found.   Doug Sou, MD 11/05/12 [phone]
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
## 2012-11-05 NOTE — ED Notes (Signed)
Report called to [facility] RN, awaiting transport to [facility] by security.

## 2012-11-05 NOTE — BHH Counselor (Signed)
Patient accepted to [facility] by Shuvon Rankin to Dr. Geoffery Lyons. The room #304-2. Nursing report # is [phone].

## 2012-11-05 NOTE — ED Provider Notes (Signed)
History    CSN: [id number] Arrival date & time 11/04/12  1814  First MD Initiated Contact with Patient 11/04/12 1849     Chief Complaint  Patient presents with  . Medical Clearance   (Consider location/radiation/quality/duration/timing/severity/associated sxs/prior Treatment) HPI Patient does emergency department for detox of alcohol.  Patient, states, that she drinks excessively and is causing problems in his personal life.  Patient, states he is not have any hallucinations, suicidal, homicidal ideation, nausea, vomiting, diarrhea, chest pain, shortness breath, or abdominal pain.  Patient, states it is patient has been constant.  Nothing seems to make his condition, better or worse.  Patient, states he's been seen in rehabilitation previously, several years, ago Past Medical History  Diagnosis Date  . Hypertension   . Depression   . Diabetes mellitus without complication   . S/P cardiac catheterization, 09/14/12 with mild non obstructive CAD and EF of 55% 09/15/2012  . DJD (degenerative joint disease) of cervical spine     on disability  . Bipolar disorder    Past Surgical History  Procedure Laterality Date  . Back surgery     Family History  Problem Relation Age of Onset  . Heart disease Brother     has had heart transplant  . Diabetes Mother   . Cancer Mother   . Hypertension Mother   . Cancer Brother   . Diabetes Brother   . Cancer Brother    History  Substance Use Topics  . Smoking status: Former Smoker -- 0.25 packs/day for 20 years    Types: Cigarettes    Quit date: 10/11/2012  . Smokeless tobacco: Never Used  . Alcohol Use: 7.8 oz/week    3 Shots of liquor, 10 Cans of beer per week     Comment: 1x week    Review of Systems All other systems negative except as documented in the HPI. All pertinent positives and negatives as reviewed in the HPI. Allergies  Review of patient's allergies indicates no known allergies.  Home Medications   Current Outpatient Rx   Name  Route  Sig  Dispense  Refill  . aspirin 81 MG tablet   Oral   Take 81 mg by mouth daily.         Marland Kitchen atorvastatin (LIPITOR) 40 MG tablet   Oral   Take 1 tablet (40 mg total) by mouth daily at 6 PM.   30 tablet   6   . escitalopram (LEXAPRO) 20 MG tablet   Oral   Take 20 mg by mouth daily.          Marland Kitchen glipiZIDE (GLUCOTROL) 5 MG tablet   Oral   Take 5 mg by mouth 2 (two) times daily before a meal.         . lurasidone (LATUDA) 40 MG TABS   Oral   Take 40 mg by mouth daily with breakfast.         . metFORMIN (GLUCOPHAGE) 1000 MG tablet   Oral   Take 1,000 mg by mouth 2 (two) times daily with a meal.         . oxyCODONE-acetaminophen (PERCOCET) 10-325 MG per tablet   Oral   Take 1 tablet by mouth every 4 (four) hours as needed for pain.         . verapamil (VERELAN PM) 360 MG 24 hr capsule   Oral   Take 360 mg by mouth daily.          BP 142/80  Pulse  76  Temp(Src) 98.6 F (37 C) (Oral)  Resp 16  SpO2 97% Physical Exam  Nursing note and vitals reviewed. Constitutional: He is oriented to person, place, and time. He appears well-developed and well-nourished.  HENT:  Head: Normocephalic and atraumatic.  Mouth/Throat: Oropharynx is clear and moist.  Eyes: Pupils are equal, round, and reactive to light.  Neck: Normal range of motion. Neck supple.  Cardiovascular: Normal rate, regular rhythm and normal heart sounds.  Exam reveals no gallop and no friction rub.   No murmur heard. Pulmonary/Chest: Effort normal and breath sounds normal. No respiratory distress. He has no wheezes.  Neurological: He is alert and oriented to person, place, and time.  Skin: Skin is warm and dry. No rash noted.  Psychiatric: He has a normal mood and affect. His behavior is normal. Judgment and thought content normal.    ED Course  Procedures (including critical care time) Labs Reviewed  COMPREHENSIVE METABOLIC PANEL - Abnormal; Notable for the following:    Glucose, Bld  265 (*)    GFR calc non Af Amer 77 (*)    GFR calc Af Amer 89 (*)    All other components within normal limits  ETHANOL - Abnormal; Notable for the following:    Alcohol, Ethyl (B) 86 (*)    All other components within normal limits  SALICYLATE LEVEL - Abnormal; Notable for the following:    Salicylate Lvl <2.0 (*)    All other components within normal limits  URINE RAPID DRUG SCREEN (HOSP PERFORMED) - Abnormal; Notable for the following:    Cocaine POSITIVE (*)    All other components within normal limits  GLUCOSE, CAPILLARY - Abnormal; Notable for the following:    Glucose-Capillary 190 (*)    All other components within normal limits  ACETAMINOPHEN LEVEL  CBC   the patient will be be assessed for placement in detox MDM    Carlyle Dolly, PA-C 11/05/12 0118

## 2012-11-05 NOTE — Consult Note (Signed)
Reason for Consult: Evaluation for inpatient treatment Referring Physician: EDP  Eduardo French is an 52 y.o. male.  HPI: Patient states that he is here for help to get clean.  Patient states that he has a drug and alcohol problem.  Patient denies SI/HI/psychosis/Paranoia.  Pris history of rehab and was clean for 1 yr 4 months until the death of his brother.  Patient states that he has outpatient through [facility] which is where he gets his psychotropic medications filled.  Last visit to [facility] was Monday 11/01/2012.  Past Medical History  Diagnosis Date  . Hypertension   . Depression   . Diabetes mellitus without complication   . S/P cardiac catheterization, 09/14/12 with mild non obstructive CAD and EF of 55% 09/15/2012  . DJD (degenerative joint disease) of cervical spine     on disability  . Bipolar disorder     Past Surgical History  Procedure Laterality Date  . Back surgery      Family History  Problem Relation Age of Onset  . Heart disease Brother     has had heart transplant  . Diabetes Mother   . Cancer Mother   . Hypertension Mother   . Cancer Brother   . Diabetes Brother   . Cancer Brother     Social History:  reports that he quit smoking about 3 weeks ago. His smoking use included Cigarettes. He has a 5 pack-year smoking history. He has never used smokeless tobacco. He reports that he drinks about 7.8 ounces of alcohol per week. He reports that he does not use illicit drugs.  Allergies: No Known Allergies  Medications: I have reviewed the patient's current medications.  Results for orders placed during the hospital encounter of 11/04/12 (from the past 48 hour(s))  ACETAMINOPHEN LEVEL     Status: None   Collection Time    11/04/12  7:35 PM      Result Value Range   Acetaminophen (Tylenol), Serum <15.0  10 - 30 ug/mL   Comment:            THERAPEUTIC CONCENTRATIONS VARY     SIGNIFICANTLY. A RANGE OF 10-30     ug/mL MAY BE AN EFFECTIVE     CONCENTRATION FOR  MANY PATIENTS.     HOWEVER, SOME ARE BEST TREATED     AT CONCENTRATIONS OUTSIDE THIS     RANGE.     ACETAMINOPHEN CONCENTRATIONS     >150 ug/mL AT 4 HOURS AFTER     INGESTION AND >50 ug/mL AT 12     HOURS AFTER INGESTION ARE     OFTEN ASSOCIATED WITH TOXIC     REACTIONS.  CBC     Status: None   Collection Time    11/04/12  7:35 PM      Result Value Range   WBC 5.7  4.0 - 10.5 K/uL   RBC 4.94  4.22 - 5.81 MIL/uL   Hemoglobin 15.0  13.0 - 17.0 g/dL   HCT 16.1  09.6 - 04.5 %   MCV 85.6  78.0 - 100.0 fL   MCH 30.4  26.0 - 34.0 pg   MCHC 35.5  30.0 - 36.0 g/dL   RDW 40.9  81.1 - 91.4 %   Platelets 274  150 - 400 K/uL  COMPREHENSIVE METABOLIC PANEL     Status: Abnormal   Collection Time    11/04/12  7:35 PM      Result Value Range   Sodium 140  135 -  145 mEq/L   Potassium 3.8  3.5 - 5.1 mEq/L   Chloride 106  96 - 112 mEq/L   CO2 21  19 - 32 mEq/L   Glucose, Bld 265 (*) 70 - 99 mg/dL   BUN 11  6 - 23 mg/dL   Creatinine, Ser 4.09  0.50 - 1.35 mg/dL   Calcium 9.1  8.4 - 81.1 mg/dL   Total Protein 7.6  6.0 - 8.3 g/dL   Albumin 3.9  3.5 - 5.2 g/dL   AST 24  0 - 37 U/L   ALT 25  0 - 53 U/L   Alkaline Phosphatase 61  39 - 117 U/L   Total Bilirubin 0.3  0.3 - 1.2 mg/dL   GFR calc non Af Amer 77 (*) >90 mL/min   GFR calc Af Amer 89 (*) >90 mL/min   Comment:            The eGFR has been calculated     using the CKD EPI equation.     This calculation has not been     validated in all clinical     situations.     eGFR's persistently     <90 mL/min signify     possible Chronic Kidney Disease.  Eduardo French     Status: Abnormal   Collection Time    11/04/12  7:35 PM      Result Value Range   Alcohol, Ethyl (B) 86 (*) 0 - 11 mg/dL   Comment:            LOWEST DETECTABLE LIMIT FOR     SERUM ALCOHOL IS 11 mg/dL     FOR MEDICAL PURPOSES ONLY  SALICYLATE LEVEL     Status: Abnormal   Collection Time    11/04/12  7:35 PM      Result Value Range   Salicylate Lvl <2.0 (*) 2.8 - 20.0  mg/dL  URINE RAPID DRUG SCREEN (HOSP PERFORMED)     Status: Abnormal   Collection Time    11/04/12  9:25 PM      Result Value Range   Opiates NONE DETECTED  NONE DETECTED   Cocaine POSITIVE (*) NONE DETECTED   Benzodiazepines NONE DETECTED  NONE DETECTED   Amphetamines NONE DETECTED  NONE DETECTED   Tetrahydrocannabinol NONE DETECTED  NONE DETECTED   Barbiturates NONE DETECTED  NONE DETECTED   Comment:            DRUG SCREEN FOR MEDICAL PURPOSES     ONLY.  IF CONFIRMATION IS NEEDED     FOR ANY PURPOSE, NOTIFY LAB     WITHIN 5 DAYS.                LOWEST DETECTABLE LIMITS     FOR URINE DRUG SCREEN     Drug Class       Cutoff (ng/mL)     Amphetamine      1000     Barbiturate      200     Benzodiazepine   200     Tricyclics       300     Opiates          300     Cocaine          300     THC              50  GLUCOSE, CAPILLARY     Status: Abnormal   Collection Time    11/04/12  9:33 PM      Result Value Range   Glucose-Capillary 190 (*) 70 - 99 mg/dL  GLUCOSE, CAPILLARY     Status: Abnormal   Collection Time    11/05/12  8:22 AM      Result Value Range   Glucose-Capillary 138 (*) 70 - 99 mg/dL    No results found.  Review of Systems  Psychiatric/Behavioral: Positive for depression. Negative for suicidal ideas, hallucinations and memory loss. The patient is not nervous/anxious and does not have insomnia.        Patient states that I have been suicidal in the past but not now.  I just want to get off of drugs and alcohol  All other systems reviewed and are negative.   Blood pressure 117/63, pulse 66, temperature 97.4 F (36.3 C), temperature source Oral, resp. rate 18, SpO2 97.00%. Physical Exam  Constitutional: He is oriented to person, place, and time. He appears well-developed.  HENT:  Head: Normocephalic and atraumatic.  Neck: Normal range of motion.  Respiratory: Effort normal.  Musculoskeletal: Normal range of motion.  Neurological: He is alert and oriented to  person, place, and time.  Skin: Skin is warm and dry.  Psychiatric: His mood appears anxious. He is not agitated, not aggressive and not hyperactive. Thought content is not paranoid. He exhibits a depressed mood. He expresses no homicidal and no suicidal ideation.   Face to face interview and consulted with Dr. Lolly Mustache  Assessment/Plan:  Axis I: Depressive Disorder NOS and Polysubstance Abuse  Recommendation:  Inpatient treatment.  Accepted to [facility] hall pending bed availability.   Refer to other facilities if no bed at [facility]. Admit for crisis management and stabilization.  2. Review and initiate  medications pertinent to patient illness and treatment.  3. Medication management to reduce current symptoms to base line and improve the         patient's overall level of functioning.   Start Librium Protocol  Assunta Found, FNP-BC 11/05/2012, 11:30 AM     I agreed with the findings and involved in the treatment plan.

## 2012-11-05 NOTE — Tx Team (Signed)
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 11/05/2012     Benign prostatic hypertrophy 11/05/2012     DJD 11/05/2012     Diabetes Mellitus Type 2 11/05/2012                              DISCHARGE CRITERIA:  Ability to meet basic life and health needs Adequate post-discharge living arrangements Improved stabilization in mood, thinking, and/or behavior Medical problems require only outpatient monitoring  PRELIMINARY DISCHARGE PLAN: Attend aftercare/continuing care group Outpatient therapy Participate in family therapy  PATIENT/FAMIILY INVOLVEMENT: This treatment plan has been presented to and reviewed with the patient, Eduardo French, and/or family member, .  The patient and family have been given the opportunity to ask questions and make suggestions.  Rich Brave 11/05/2012, 7:15 PM

## 2012-11-06 DIAGNOSIS — F1020 Alcohol dependence, uncomplicated: Principal | ICD-10-CM

## 2012-11-06 DIAGNOSIS — F200 Paranoid schizophrenia: Secondary | ICD-10-CM

## 2012-11-06 DIAGNOSIS — F1420 Cocaine dependence, uncomplicated: Secondary | ICD-10-CM

## 2012-11-06 DIAGNOSIS — F209 Schizophrenia, unspecified: Secondary | ICD-10-CM

## 2012-11-06 HISTORY — DX: Paranoid schizophrenia: F20.0

## 2012-11-06 HISTORY — DX: Cocaine dependence, uncomplicated: F14.20

## 2012-11-06 HISTORY — DX: Alcohol dependence, uncomplicated: F10.20

## 2012-11-06 LAB — GLUCOSE, CAPILLARY
Glucose-Capillary: 146 mg/dL — ABNORMAL HIGH (ref 70–99)
Glucose-Capillary: 135 mg/dL — ABNORMAL HIGH (ref 70–99)

## 2012-11-06 MED ORDER — VERAPAMIL HCL ER 360 MG PO CP24: 360.0000 mg | ORAL_CAPSULE | Freq: Every day | ORAL | Status: DC

## 2012-11-06 MED ORDER — VERAPAMIL HCL ER 180 MG PO TBCR
360.0000 mg | EXTENDED_RELEASE_TABLET | Freq: Every day | ORAL | Status: DC
  Administered 2012-11-06 – 2012-11-10 (×5): 360 mg via ORAL
  Filled 2012-11-06: qty 2
  Filled 2012-11-06: qty 28
  Filled 2012-11-06: qty 2
  Filled 2012-11-06: qty 28
  Filled 2012-11-06 (×3): qty 2

## 2012-11-06 MED ORDER — LURASIDONE HCL 40 MG PO TABS
40.0000 mg | ORAL_TABLET | Freq: Every day | ORAL | Status: DC
  Administered 2012-11-07: 40 mg via ORAL
  Filled 2012-11-06 (×3): qty 1

## 2012-11-06 MED ORDER — INSULIN ASPART 100 UNIT/ML ~~LOC~~ SOLN
0.0000 [IU] | Freq: Three times a day (TID) | SUBCUTANEOUS | Status: DC
  Administered 2012-11-06 – 2012-11-09 (×5): 2 [IU] via SUBCUTANEOUS

## 2012-11-06 MED ORDER — METFORMIN HCL 500 MG PO TABS
1000.0000 mg | ORAL_TABLET | Freq: Two times a day (BID) | ORAL | Status: DC
  Administered 2012-11-06 – 2012-11-10 (×9): 1000 mg via ORAL
  Filled 2012-11-06 (×3): qty 2
  Filled 2012-11-06 (×2): qty 56
  Filled 2012-11-06: qty 2
  Filled 2012-11-06: qty 56
  Filled 2012-11-06 (×2): qty 2
  Filled 2012-11-06: qty 56
  Filled 2012-11-06 (×2): qty 2

## 2012-11-06 NOTE — H&P (Signed)
  Pt was seen by me today and I agree with the key elements documented in H&P.  
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
## 2012-11-06 NOTE — H&P (Signed)
Psychiatric Admission Assessment Adult  Patient Identification:  Eduardo French  Date of Evaluation:  11/06/2012  Chief Complaint:  POLYSUBSTANCE DEPENDENCE  History of Present Illness: This is a 52 year old African-American Male. Admitted to [facility] from the [facility] with complaints of alcohol intoxication and cocaine abuse requesting detoxification treatment. Patient reports, "I went to the hospital ED on Thursday evening. I was feeling very depressed. Also, I have drug and alcohol problems. I have been battling substance abuse issues for most of my life. So, I have decided to take this step to stop and may be get my wife back. I have been an alcoholic for a long time. I started smoking weed at 15, then stopped in my 11s, but continued with alcohol till date. I started using cocaine in my early 30s including heroin. Later, I started snorting cocaine. I have been cleaned several times in my life, but will usually relapse from 8 months to a year of sobriety. My longest time of sobriety was 16 months, and that was a long time ago. I have had drug charges, DUIS and DWI. I was also diagnosed with Schizophrenia, chronic depression and bipolar disorder 2 years ago. I'm still hearing voices and seeing stuff till this day".  Elements:  Location:  [facility] adult unit. Quality:  Cravings, depression, boredom. Severity:  Severe. Timing:  "I have always used drugs and alcohol". Duration:  "I started using at 15". Context:  "Increased depression, Suicidal ideation, high anxiety, sleeplessness".  Associated Signs/Synptoms:  Depression Symptoms:  depressed mood, insomnia, psychomotor agitation, feelings of worthlessness/guilt, hopelessness, anxiety, loss of energy/fatigue,  (Hypo) Manic Symptoms:  Hallucinations, Impulsivity, Irritable Mood,  Anxiety Symptoms:  Excessive Worry, Panic Symptoms,  Psychotic Symptoms:  Hallucinations: Auditory Visual  PTSD Symptoms: Had a traumatic exposure:   None reported  Psychiatric Specialty Exam: Physical Exam  Constitutional: He is oriented to person, place, and time. He appears well-developed and well-nourished.  HENT:  Head: Normocephalic.  Eyes: Pupils are equal, round, and reactive to light.  Neck: Normal range of motion.  Cardiovascular: Normal rate.   Respiratory: Effort normal.  GI: Soft.  Musculoskeletal: Normal range of motion.  Neurological: He is alert and oriented to person, place, and time.  Skin: Skin is warm and dry.  Psychiatric: His speech is normal and behavior is normal. Judgment and thought content normal. His mood appears anxious (Rated #7). Cognition and memory are normal. He exhibits a depressed mood (Rated at #8).    Review of Systems  Constitutional: Negative.   HENT: Negative.   Eyes: Negative.   Respiratory: Negative.   Cardiovascular: Negative.   Gastrointestinal: Negative.   Genitourinary: Negative.   Musculoskeletal: Positive for myalgias.  Skin: Negative.   Neurological: Positive for tremors.  Endo/Heme/Allergies: Negative.   Psychiatric/Behavioral: Positive for depression (Rated at #8) and substance abuse (Cocaine/alcohol dependence). Negative for suicidal ideas, hallucinations and memory loss. The patient is nervous/anxious (Rated at #7) and has insomnia.     Blood pressure 159/87, pulse 63, temperature 97 F (36.1 C), temperature source Oral, resp. rate 14, height 5\' 11"  (1.803 m), weight 102.513 kg (226 lb), SpO2 98.00%.Body mass index is 31.53 kg/(m^2).  General Appearance: Disheveled  Eye Solicitor::  Fair  Speech:  Clear and Coherent  Volume:  Normal  Mood:  Anxious and Depressed  Affect:  Flat  Thought Process:  Coherent and Goal Directed  Orientation:  Full (Time, Place, and Person)  Thought Content:  Rumination, auditory and visual hallucinations  Suicidal Thoughts:  No, but present on admission  Homicidal Thoughts:  No  Memory:  Immediate;   Good Recent;   Good Remote;   Good   Judgement:  Fair  Insight:  Good  Psychomotor Activity:  Tremors, anxiousness  Concentration:  Fair  Recall:  Good  Akathisia:  No  Handed:  Right  AIMS (if indicated):     Assets:  Desire for Improvement  Sleep:  Number of Hours: 6.25    Past Psychiatric History: Diagnosis:Alcohol dependence, Cocaine dependence, Schizophrenia  Hospitalizations: [facility]  Outpatient Care: [facility] with Dr. Fredric Mare  Substance Abuse Care: None of recent reported  Self-Mutilation: Denies  Suicidal Attempts: Denies attempts, admitted thoughts  Violent Behaviors: None reported   Past Medical History:   Past Medical History  Diagnosis Date  . Hypertension   . Depression   . Diabetes mellitus without complication   . S/P cardiac catheterization, 09/14/12 with mild non obstructive CAD and EF of 55% 09/15/2012  . DJD (degenerative joint disease) of cervical spine     on disability  . Bipolar disorder    Cardiac History:  HTN  Allergies:  No Known Allergies  PTA Medications: Prescriptions prior to admission  Medication Sig Dispense Refill  . aspirin 81 MG tablet Take 81 mg by mouth daily.      Marland Kitchen atorvastatin (LIPITOR) 40 MG tablet Take 1 tablet (40 mg total) by mouth daily at 6 PM.  30 tablet  6  . escitalopram (LEXAPRO) 20 MG tablet Take 20 mg by mouth daily.       Marland Kitchen glipiZIDE (GLUCOTROL) 5 MG tablet Take 5 mg by mouth 2 (two) times daily before a meal.      . lurasidone (LATUDA) 40 MG TABS Take 40 mg by mouth daily with breakfast.      . metFORMIN (GLUCOPHAGE) 1000 MG tablet Take 1,000 mg by mouth 2 (two) times daily with a meal.      . oxyCODONE-acetaminophen (PERCOCET) 10-325 MG per tablet Take 1 tablet by mouth every 4 (four) hours as needed for pain.      . verapamil (VERELAN PM) 360 MG 24 hr capsule Take 360 mg by mouth daily.        Previous Psychotropic Medications:  Medication/Dose  See medications               Substance Abuse History in the last 12 months:  yes  Consequences  of Substance Abuse: Medical Consequences:  Liver damage, Possible death by overdose Legal Consequences:  Arrests, jail time, Loss of driving privilege. Family Consequences:  Family discord, divorce and or separation.  Social History:  reports that he quit smoking about 3 weeks ago. His smoking use included Cigarettes. He has a 5 pack-year smoking history. He has never used smokeless tobacco. He reports that he drinks about 7.8 ounces of alcohol per week. He reports that he does not use illicit drugs. Additional Social History: Pain Medications: percocet Prescriptions: yes History of alcohol / drug use?: Yes Negative Consequences of Use: Financial;Legal;Personal relationships Withdrawal Symptoms: Cramps;Fever / Chills  Current Place of Residence: [place], [place]  Place of Birth: [place], [place]   Family Members: "My daughter"  Marital Status:  Separated  Children: 1  Sons: 0  Daughters: 1  Relationships: Separated  Education:  No high school diploma  Educational Problems/Performance: Did not complete high school  Religious Beliefs/Practices: NA  History of Abuse (Emotional/Phsycial/Sexual): Denies  Occupational Experiences: Disabled  Military History:  None.  Legal History: None  reported  Hobbies/Interests: None reported  Family History:   Family History  Problem Relation Age of Onset  . Heart disease Brother     has had heart transplant  . Diabetes Mother   . Cancer Mother   . Hypertension Mother   . Cancer Brother   . Diabetes Brother   . Cancer Brother     Results for orders placed during the hospital encounter of 11/05/12 (from the past 72 hour(s))  GLUCOSE, CAPILLARY     Status: None   Collection Time    11/05/12  4:55 PM      Result Value Range   Glucose-Capillary 93  70 - 99 mg/dL  GLUCOSE, CAPILLARY     Status: Abnormal   Collection Time    11/06/12  6:13 AM      Result Value Range   Glucose-Capillary 149 (*) 70 - 99 mg/dL   Psychological  Evaluations:  Assessment:   AXIS I:  Alcoholo dependence, Cocaine dependence, Schizophrenia AXIS II:  Deferred AXIS III:   Past Medical History  Diagnosis Date  . Hypertension   . Depression   . Diabetes mellitus without complication   . S/P cardiac catheterization, 09/14/12 with mild non obstructive CAD and EF of 55% 09/15/2012  . DJD (degenerative joint disease) of cervical spine     on disability  . Bipolar disorder    AXIS IV:  Substance abuse issues, chronic mental illness AXIS V:  1-10 persistent dangerousness to self and others present  Treatment Plan/Recommendations: 1. Admit for crisis management and stabilization, estimated length of stay 3-5 days.  2. Medication management to reduce current symptoms to base line and improve the patient's overall level of functioning; (a). Continue Librium detoxification treatment protocol.                                 (b). Re-initiate Latuda 40 mg for mood control.                                 (c). Continue Lexapro 20 mg daily for depression. 3. Treat health problems as indicated.  4. Develop treatment plan to decrease risk of relapse upon discharge and the need for readmission.  5. Psycho-social education regarding relapse prevention and self care.  6. Health care follow up as needed for medical problems.  7. Review, reconcile, and reinstate any pertinent home medications for other health issues where appropriate. 8. Call for consults with hospitalist for any additional specialty patient care services as needed.  Treatment Plan Summary: Daily contact with patient to assess and evaluate symptoms and progress in treatment Medication management  Current Medications:  Current Facility-Administered Medications  Medication Dose Route Frequency Provider Last Rate Last Dose  . acetaminophen (TYLENOL) tablet 650 mg  650 mg Oral Q6H PRN Shuvon Rankin, NP      . alum & mag hydroxide-simeth (MAALOX/MYLANTA) 200-200-20 MG/5ML suspension 30 mL   30 mL Oral Q4H PRN Shuvon Rankin, NP      . aspirin chewable tablet 81 mg  81 mg Oral Daily Shuvon Rankin, NP   81 mg at 11/06/12 0842  . atorvastatin (LIPITOR) tablet 40 mg  40 mg Oral q1800 Shuvon Rankin, NP   40 mg at 11/05/12 1810  . chlordiazePOXIDE (LIBRIUM) capsule 25 mg  25 mg Oral Q6H PRN Shuvon Rankin, NP      .  chlordiazePOXIDE (LIBRIUM) capsule 25 mg  25 mg Oral QID Shuvon Rankin, NP   25 mg at 11/06/12 0843   Followed by  . [START ON 11/07/2012] chlordiazePOXIDE (LIBRIUM) capsule 25 mg  25 mg Oral TID Shuvon Rankin, NP       Followed by  . [START ON 11/08/2012] chlordiazePOXIDE (LIBRIUM) capsule 25 mg  25 mg Oral BH-qamhs Shuvon Rankin, NP       Followed by  . [START ON 11/10/2012] chlordiazePOXIDE (LIBRIUM) capsule 25 mg  25 mg Oral Daily Shuvon Rankin, NP      . escitalopram (LEXAPRO) tablet 20 mg  20 mg Oral Daily Shuvon Rankin, NP   20 mg at 11/06/12 0842  . folic acid (FOLVITE) tablet 1 mg  1 mg Oral Daily Shuvon Rankin, NP   1 mg at 11/06/12 0843  . glipiZIDE (GLUCOTROL) tablet 5 mg  5 mg Oral BID AC Shuvon Rankin, NP   5 mg at 11/06/12 1610  . hydrOXYzine (ATARAX/VISTARIL) tablet 25 mg  25 mg Oral Q6H PRN Shuvon Rankin, NP      . insulin aspart (novoLOG) injection 0-15 Units  0-15 Units Subcutaneous TID WC Sanjuana Kava, NP      . loperamide (IMODIUM) capsule 2-4 mg  2-4 mg Oral PRN Shuvon Rankin, NP      . magnesium hydroxide (MILK OF MAGNESIA) suspension 30 mL  30 mL Oral Daily PRN Shuvon Rankin, NP      . multivitamin with minerals tablet 1 tablet  1 tablet Oral Daily Shuvon Rankin, NP   1 tablet at 11/06/12 0842  . ondansetron (ZOFRAN-ODT) disintegrating tablet 4 mg  4 mg Oral Q6H PRN Shuvon Rankin, NP      . thiamine (B-1) injection 100 mg  100 mg Intramuscular Once Shuvon Rankin, NP      . thiamine (VITAMIN B-1) tablet 100 mg  100 mg Oral Daily Shuvon Rankin, NP   100 mg at 11/06/12 9604    Observation Level/Precautions:  15 minute checks  Laboratory:  Reviewed ED lab  findings on file  Psychotherapy:  Group sessions  Medications:  See medication lists  Consultations:  As needed  Discharge Concerns:  Safety, maintaining sobriety  Estimated LOS: 3-5 days  Other:     I certify that inpatient services furnished can reasonably be expected to improve the patient's condition.   Sanjuana Kava, PMHNP, FNP-bc 7/5/20149:38 AM

## 2012-11-06 NOTE — Progress Notes (Signed)
Adult Psychoeducational Group Note  Date:  11/06/2012 Time:  1300  Group Topic/Focus:  Making Healthy Choices:   The focus of this group is to help patients identify negative/unhealthy choices they were using prior to admission and identify positive/healthier coping strategies to replace them upon discharge. Recovery Goals:   The focus of this group is to identify appropriate goals for recovery and establish a plan to achieve them.  Participation Level:  Minimal  Participation Quality:  Appropriate and Attentive  Affect:  Anxious  Cognitive:  Appropriate  Insight: Improving  Engagement in Group:  Engaged  Modes of Intervention:  Discussion and Education  Additional Comments:  Pt was attentive and alert during group discussion.  Eduardo French 11/06/2012, 3:00 PM

## 2012-11-06 NOTE — Progress Notes (Signed)
D   Pt has been appropriate and pleasant  He has attended groups and participated appropriately   He denies signs and symptoms of withdrawal   Pt presently denies suicidal ideation A   Verbal support given   Medications administered and effectiveness monitored   Q 15 min checks R   Pt safe at present

## 2012-11-06 NOTE — Progress Notes (Signed)
Psychoeducational Group Note  Date:  11/06/2012 Time:  0945 am  Group Topic/Focus:  Identifying Needs:   The focus of this group is to help patients identify their personal needs that have been historically problematic and identify healthy behaviors to address their needs.  Participation Level:  Active  Participation Quality:  Appropriate  Affect:  Appropriate  Cognitive:  Alert and Appropriate  Insight:  Developing/Improving  Engagement in Group:  Developing/Improving  Additional Comments:    [name] 11/06/2012,9:44 AM

## 2012-11-06 NOTE — [facility] Suicide Risk Assessment (Signed)
Suicide Risk Assessment  Admission Assessment     Nursing information obtained from:    Demographic factors:   male Current Mental Status:   denies si, no hi, logical Loss Factors:   unable to function well Historical Factors:    past Si attempts Risk Reduction Factors:   lives with family  CLINICAL FACTORS:   Alcohol/Substance Abuse/Dependencies  COGNITIVE FEATURES THAT CONTRIBUTE TO RISK:  Closed-mindedness    SUICIDE RISK:   Mild:  Suicidal ideation of limited frequency, intensity, duration, and specificity.  There are no identifiable plans, no associated intent, mild dysphoria and related symptoms, good self-control (both objective and subjective assessment), few other risk factors, and identifiable protective factors, including available and accessible social support.  PLAN OF CARE:  Continue current meds  I certify that inpatient services furnished can reasonably be expected to improve the patient's condition.  Wonda Cerise 11/06/2012, 2:44 PM

## 2012-11-06 NOTE — [facility] Group Notes (Signed)
[facility] Group Notes:  (Clinical Social Work)  11/06/2012     10-11AM  Summary of Progress/Problems:   The main focus of today's process group was for the patient to identify ways in which they have in the past sabotaged their own recovery. Motivational Interviewing was utilized to ask the group members what they get out of their substance use, and what reasons they may have for wanting to change.  The Stages of Change were explained using a handout, and patients identified where they currently are with regard to stages of change.  The patient expressed concern that he was in group prior to talking to LCSW.  He was called out to speak with doctor prior to engaging in group.  Type of Therapy:  Group Therapy - Process   Participation Level:  Minimal  Participation Quality:  Attentive  Affect:  Blunted and Depressed  Cognitive:  Oriented  Insight:  Limited  Engagement in Therapy:  Limited  Modes of Intervention:  Education, Support and Processing, Motivational Interviewing  Ambrose Mantle, LCSW 11/06/2012, 12:23 PM

## 2012-11-07 LAB — GLUCOSE, CAPILLARY: Glucose-Capillary: 137 mg/dL — ABNORMAL HIGH (ref 70–99)

## 2012-11-07 MED ORDER — HALOPERIDOL 2 MG PO TABS
2.0000 mg | ORAL_TABLET | Freq: Every day | ORAL | Status: DC
  Administered 2012-11-08 – 2012-11-09 (×2): 2 mg via ORAL
  Filled 2012-11-07: qty 1
  Filled 2012-11-07 (×2): qty 14
  Filled 2012-11-07: qty 1

## 2012-11-07 NOTE — Progress Notes (Signed)
Adult Psychoeducational Group Note  Date:  11/07/2012 Time:  1300  Group Topic/Focus:  Recovery Goals:   The focus of this group is to identify appropriate goals for recovery and establish a plan to achieve them. Spirituality:   The focus of this group is to discuss how one's spirituality can aide in recovery.  Participation Level:  Did Not Attend  Pt decided not to attend group.  Asir Bingley Shari Prows 11/07/2012, 5:34 PM

## 2012-11-07 NOTE — Progress Notes (Signed)
Adult Psychoeducational Group Note  Date:  11/07/2012 Time:  5:59 PM  Group Topic/Focus:  Different Perspectives  Participation Level:  Active  Participation Quality:  Appropriate and Sharing  Affect:  Appropriate and Flat  Cognitive:  Appropriate  Insight: Appropriate and Good  Engagement in Group:  Developing/Improving and Engaged  Modes of Intervention:  Discussion, Socialization and Support  Additional Comments:Pt attended group and actively participated. Pt stated that his irrational fear is that he will get on a boat on the sea and it will sink and he will drown. Staff provided support and offered a positive thought that there will be others on the boat and he wouldn't be alone. The purpose of this group is to be able to identify at least one positive thought that challenges an irrational one. The patient will be able to discuss the importance of seeing things from a different perspective.   Caswell Corwin 11/07/2012, 5:59 PM

## 2012-11-07 NOTE — Progress Notes (Signed)
Patient ID: Eduardo French, male   DOB: [birth year], 52 y.o.   MRN: [id number] D. The patient has an anxious mood and affect. He has a fine tremor that is not visible, but can be felt by touch. Voiced concern that he was still feeling shaking. The patient is visible in the milieu, but minimal interaction with others. He mostly spent his time watching TV. A. Met with the patient to assess. Verbal support and encouragement given. Encouraged to attend evening AA/NA meeting. Reviewed recovery plan. Administered medications. R. The patient is compliant with his medication. He attended evening group.

## 2012-11-07 NOTE — Progress Notes (Signed)
D   Pt has been calm and cooperative  He attended groups this morning but as the morning progressed he became dizzy and sluggish and reported he did not feel well   He attributes same to latuda which he has taken before and the same thing happened  He did not go to any of the pm groups but has remained in bed A   Verbal support given  Medications administered and effectiveness monitored   Q 15 min checks R   Patient safe at present

## 2012-11-07 NOTE — Progress Notes (Signed)
Patient did attend the evening speaker AA meeting.  

## 2012-11-07 NOTE — [facility] Group Notes (Signed)
[facility] Group Notes:  (Clinical Social Work)  11/07/2012  10:00-11:00AM  Summary of Progress/Problems:   The main focus of today's process group was to   identify the patient's current support system and decide on other supports that can be put in place.  Four definitions/levels of support were discussed and an exercise was utilized to show how much stronger we become with additional supports.  An emphasis was placed on using counselor, doctor, therapy groups, 12-step groups, and problem-specific support groups to expand supports, as well as doing something different than has been done before. The patient expressed a willingness to add a serious search for ONE friend with similar interests.  He will continue to look for an AA sponsor.  His sons, pastor, and some church friends are now supportive.  Type of Therapy:  Process Group with Motivational Interviewing  Participation Level:  Active  Participation Quality:  Attentive and Sharing  Affect:  Blunted  Cognitive:  Oriented  Insight:  Engaged  Engagement in Therapy:  Engaged  Modes of Intervention:   Education, Support and Processing, Activity  [employer], LCSW 11/07/2012, 1:07 PM

## 2012-11-07 NOTE — Progress Notes (Signed)
Psychoeducational Group Note  Date:  11/07/2012 Time:  0945 am  Group Topic/Focus:  Making Healthy Choices:   The focus of this group is to help patients identify negative/unhealthy choices they were using prior to admission and identify positive/healthier coping strategies to replace them upon discharge.  Participation Level:  Active  Participation Quality:  Appropriate, Attentive, Sharing and Supportive  Affect:  Appropriate  Cognitive:  Alert and Appropriate  Insight:  Engaged  Engagement in Group:  Engaged  Additional Comments:    [name] 11/07/2012, 10:29 AM

## 2012-11-07 NOTE — Progress Notes (Signed)
[facility] MD Progress Note  11/07/2012 3:49 PM [name]  MRN:  [id number]  Subjective:  Mr. Eduardo French reports that he is feeling too weak, sluggish and dizzy. Blames how he is feeling on [name]. States that [name] does this to him. He adds that it also makes his blood sugar rise. Declines to remain on Latuda, however, asked for other medication that will not make him feel like he is feeling now. He says he cannot function feeling like he is now.  Diagnosis:   Axis I: Alcohol dependence, Cocaine depndence, Paranoid schizophrenia, chronic condition Axis II: Deferred Axis III:  Past Medical History  Diagnosis Date  . Hypertension   . Depression   . Diabetes mellitus without complication   . S/P cardiac catheterization, 09/14/12 with mild non obstructive CAD and EF of 55% 09/15/2012  . DJD (degenerative joint disease) of cervical spine     on disability  . Bipolar disorder    Axis IV: Substance dependency Axis V: 41-50 serious symptoms  ADL's:  Intact  Sleep: Good  Appetite:  Fair  Suicidal Ideation:  Plan:  Denies Intent:  Denies Means:  Denies Homicidal Ideation:  Plan:  Denies Intent:  Denies Means:  Denies AEB (as evidenced by):  Psychiatric Specialty Exam: Review of Systems  Constitutional: Positive for malaise/fatigue.  HENT: Negative.   Eyes: Negative.   Respiratory: Negative.   Cardiovascular: Negative.   Gastrointestinal: Negative.   Skin: Negative.   Neurological: Positive for dizziness and weakness.  Endo/Heme/Allergies: Negative.   Psychiatric/Behavioral: Positive for depression, hallucinations and substance abuse. Negative for suicidal ideas. The patient is not nervous/anxious and does not have insomnia.     Blood pressure 132/80, pulse 64, temperature 98.3 F (36.8 C), temperature source Oral, resp. rate 20, height 5\' 11"  (1.803 m), weight 102.513 kg (226 lb), SpO2 98.00%.Body mass index is 31.53 kg/(m^2).  General Appearance: Fairly Groomed  Proofreader::  Fair  Speech:  Clear and Coherent  Volume:  Normal  Mood:  Depressed  Affect:  Flat  Thought Process:  Coherent and Intact  Orientation:  Full (Time, Place, and Person)  Thought Content:  Hallucinations: Auditory and Rumination  Suicidal Thoughts:  No  Homicidal Thoughts:  No  Memory:  Immediate;   Good Recent;   Good Remote;   Good  Judgement:  Fair  Insight:  Fair  Psychomotor Activity:  Decreased  Concentration:  Fair  Recall:  Good  Akathisia:  No  Handed:  Right  AIMS (if indicated):     Assets:  Desire for Improvement  Sleep:  Number of Hours: 6   Current Medications: Current Facility-Administered Medications  Medication Dose Route Frequency Provider Last Rate Last Dose  . acetaminophen (TYLENOL) tablet 650 mg  650 mg Oral Q6H PRN Shuvon Rankin, NP   650 mg at 11/07/12 0848  . alum & mag hydroxide-simeth (MAALOX/MYLANTA) 200-200-20 MG/5ML suspension 30 mL  30 mL Oral Q4H PRN Shuvon Rankin, NP      . aspirin chewable tablet 81 mg  81 mg Oral Daily Shuvon Rankin, NP   81 mg at 11/07/12 0847  . atorvastatin (LIPITOR) tablet 40 mg  40 mg Oral q1800 Shuvon Rankin, NP   40 mg at 11/06/12 1728  . chlordiazePOXIDE (LIBRIUM) capsule 25 mg  25 mg Oral Q6H PRN Shuvon Rankin, NP      . chlordiazePOXIDE (LIBRIUM) capsule 25 mg  25 mg Oral TID Shuvon Rankin, NP       Followed by  . [  START ON 11/08/2012] chlordiazePOXIDE (LIBRIUM) capsule 25 mg  25 mg Oral BH-qamhs Shuvon Rankin, NP       Followed by  . [START ON 11/10/2012] chlordiazePOXIDE (LIBRIUM) capsule 25 mg  25 mg Oral Daily Shuvon Rankin, NP      . escitalopram (LEXAPRO) tablet 20 mg  20 mg Oral Daily Shuvon Rankin, NP   20 mg at 11/07/12 0847  . folic acid (FOLVITE) tablet 1 mg  1 mg Oral Daily Shuvon Rankin, NP   1 mg at 11/07/12 0847  . glipiZIDE (GLUCOTROL) tablet 5 mg  5 mg Oral BID AC Shuvon Rankin, NP   5 mg at 11/07/12 1610  . [START ON 11/08/2012] haloperidol (HALDOL) tablet 2 mg  2 mg Oral Q2000 Sanjuana Kava,  NP      . hydrOXYzine (ATARAX/VISTARIL) tablet 25 mg  25 mg Oral Q6H PRN Shuvon Rankin, NP      . insulin aspart (novoLOG) injection 0-15 Units  0-15 Units Subcutaneous TID WC Sanjuana Kava, NP   2 Units at 11/07/12 [phone]  . loperamide (IMODIUM) capsule 2-4 mg  2-4 mg Oral PRN Shuvon Rankin, NP      . magnesium hydroxide (MILK OF MAGNESIA) suspension 30 mL  30 mL Oral Daily PRN Shuvon Rankin, NP      . metFORMIN (GLUCOPHAGE) tablet 1,000 mg  1,000 mg Oral BID WC Sanjuana Kava, NP   1,000 mg at 11/07/12 0847  . multivitamin with minerals tablet 1 tablet  1 tablet Oral Daily Shuvon Rankin, NP   1 tablet at 11/07/12 0847  . ondansetron (ZOFRAN-ODT) disintegrating tablet 4 mg  4 mg Oral Q6H PRN Shuvon Rankin, NP      . thiamine (B-1) injection 100 mg  100 mg Intramuscular Once Shuvon Rankin, NP      . thiamine (VITAMIN B-1) tablet 100 mg  100 mg Oral Daily Shuvon Rankin, NP   100 mg at 11/07/12 0847  . verapamil (CALAN-SR) CR tablet 360 mg  360 mg Oral Daily Rachael Fee, MD   360 mg at 11/07/12 5409    Lab Results:  Results for orders placed during the hospital encounter of 11/05/12 (from the past 48 hour(s))  GLUCOSE, CAPILLARY     Status: None   Collection Time    11/05/12  4:55 PM      Result Value Range   Glucose-Capillary 93  70 - 99 mg/dL  GLUCOSE, CAPILLARY     Status: Abnormal   Collection Time    11/06/12  6:13 AM      Result Value Range   Glucose-Capillary 149 (*) 70 - 99 mg/dL  GLUCOSE, CAPILLARY     Status: Abnormal   Collection Time    11/06/12 11:57 AM      Result Value Range   Glucose-Capillary 146 (*) 70 - 99 mg/dL  GLUCOSE, CAPILLARY     Status: Abnormal   Collection Time    11/06/12  5:25 PM      Result Value Range   Glucose-Capillary 135 (*) 70 - 99 mg/dL  GLUCOSE, CAPILLARY     Status: Abnormal   Collection Time    11/06/12  9:50 PM      Result Value Range   Glucose-Capillary 224 (*) 70 - 99 mg/dL   Comment 1 Notify RN     Comment 2 Documented in Chart     GLUCOSE, CAPILLARY     Status: Abnormal   Collection Time    11/07/12  6:05 AM      Result Value Range   Glucose-Capillary 137 (*) 70 - 99 mg/dL  GLUCOSE, CAPILLARY     Status: Abnormal   Collection Time    11/07/12 11:50 AM      Result Value Range   Glucose-Capillary 102 (*) 70 - 99 mg/dL    Physical Findings: AIMS: Facial and Oral Movements Muscles of Facial Expression: None, normal Lips and Perioral Area: None, normal Jaw: None, normal Tongue: None, normal,Extremity Movements Upper (arms, wrists, hands, fingers): None, normal Lower (legs, knees, ankles, toes): None, normal, Trunk Movements Neck, shoulders, hips: None, normal, Overall Severity Severity of abnormal movements (highest score from questions above): None, normal Incapacitation due to abnormal movements: None, normal Patient's awareness of abnormal movements (rate only patient's report): Aware, severe distress, Dental Status Current problems with teeth and/or dentures?: No Does patient usually wear dentures?: No  CIWA:  CIWA-Ar Total: 0 COWS:  COWS Total Score: 2  Treatment Plan Summary: Daily contact with patient to assess and evaluate symptoms and progress in treatment Medication management  Plan: Supportive approach/coping skills/relapse prevention. Discontinued Latuda 40 mg. Patient declines to take because of weakness and high blood sugar levels. Start Haldol 2 mg Q bedtime for hallucinations. Cogentin 0.5 mg bid prn. Encouraged out of room, participation in group sessions and application of coping skills when distressed. Will continue to monitor response to/adverse effects of medications in use to assure effectiveness. Continue to monitor mood, behavior and interaction with staff and other patients. Continue current plan of care.  Medical Decision Making Problem Points:  Review of last therapy session (1) and Review of psycho-social stressors (1) Data Points:  Review of medication regiment & side  effects (2) Review of new medications or change in dosage (2)  I certify that inpatient services furnished can reasonably be expected to improve the patient's condition.   Armandina Stammer I, FNP 11/07/2012, 3:49 PM

## 2012-11-08 ENCOUNTER — Ambulatory Visit: Payer: Medicaid Other

## 2012-11-08 DIAGNOSIS — F329 Major depressive disorder, single episode, unspecified: Secondary | ICD-10-CM

## 2012-11-08 LAB — GLUCOSE, CAPILLARY: Glucose-Capillary: 131 mg/dL — ABNORMAL HIGH (ref 70–99)

## 2012-11-08 NOTE — [facility] Group Notes (Signed)
[facility] LCSW Aftercare Discharge Planning Group Note   11/08/2012 9:53 AM  Participation Quality:  Appropriate   Mood/Affect:  Blunted  Depression Rating:  4  Anxiety Rating:  4  Thoughts of Suicide:  No Will you contract for safety?   NA  Current AVH:  No  Plan for Discharge/Comments:  Pt would like date for [facility] for over a week out in order for him to spend time with his mother that is not well. Pt will follow up with [facility] for med management.   Transportation Means: daughter/son   Supports: family  Smart, Research scientist (physical sciences)

## 2012-11-08 NOTE — Progress Notes (Signed)
Adult Psychoeducational Group Note  Date:  11/08/2012 Time:  8:00PM Group Topic/Focus:  Wrap-Up Group:   The focus of this group is to help patients review their daily goal of treatment and discuss progress on daily workbooks.  Participation Level:  Did Not Attend    Additional Comments: Pt. Didn't attend group.   Bing Plume D 11/08/2012, 8:17 PM

## 2012-11-08 NOTE — Tx Team (Signed)
Interdisciplinary Treatment Plan Update (Adult)  Date: 11/08/2012   Time Reviewed: 4:16 PM  Progress in Treatment:  Attending groups: Intermittently Participating in groups:  Minimally  Taking medication as prescribed: Yes  Tolerating medication: Yes  Family/Significant othe contact made: No. SPE not needed for this patient.  Patient understands diagnosis: Yes, AEB seeking treatment for depression and ETOH detox.  Discussing patient identified problems/goals with staff: Yes  Medical problems stabilized or resolved: Yes  Denies suicidal/homicidal ideation: Yes during [facility] admission and in group.  Patient has not harmed self or Others: Yes  New problem(s) identified:  Discharge Plan or Barriers: Pt would like [facility] admission date for at least a week from d/c date in order to spend time with his mother who is ill. Pt will follow up at [facility] for medication management.  Additional comments: Eduardo French is an 52 y.o. male.  "Up to now, I have just wanted to die, but now, I think I might want to live." "I need help. I need a 90 day detox/rehab program." Pt denies SI,HI and AVH. Pt uses alcohol daily and crack 2 to 3x per week. Pt also uses perocets but reports "I don't abuse them. I use them the way I am suppose to." See MD notes.  Pt consumes 1 pint to a 5th daily. "I can't drink as much as I used to because of my diabetes. 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  Attendees:  Patient:    Family:    Physician: Geoffery Lyons MD 11/08/2012 4:17 PM   Nursing: Maureen Ralphs RN 11/08/2012 4:17 PM   Clinical Social Worker Markie Heffernan Smart, LCSWA  11/08/2012 4:17 PM   Other: Aggie PA 11/08/2012 4:18 PM   Other:    Other: Massie Kluver, Community Care Coordinator  11/08/2012 4:18 PM   Other:    Scribe for  Treatment Team:  Trula Slade LCSWA  11/08/2012 4:18 PM

## 2012-11-08 NOTE — [facility] Group Notes (Signed)
[facility] LCSW Group Therapy  11/08/2012 3:17 PM  Type of Therapy:  Group Therapy  Participation Level:  Did Not Attend  French, Eduardo Curci 11/08/2012, 3:17 PM

## 2012-11-08 NOTE — Progress Notes (Signed)
Patient did attend the evening speaker AA meeting.  

## 2012-11-08 NOTE — Progress Notes (Signed)
Patient ID: Eduardo French, male   DOB: [birth year], 52 y.o.   MRN: [id number] D. The patient has a neutral mood and affect. He is interacting a little more in the milieu. A fine tremor can still be felt in his hands. Stated he was feeling better. A. Met with patient 1:1.  Encouraged to attend evening AA/NA group. R. The patient attended evening group. Denied any symptoms of withdrawal.

## 2012-11-08 NOTE — Progress Notes (Signed)
[facility] MD Progress Note  11/08/2012 5:24 PM Eduardo French  MRN:  [id number] Subjective:  States that he knows that he has to quit drinking. He was planning to go straight to rehab, but states that his mother is really sick and he feels he needs to be there for her. Would like to have a later appointment to go. Still feeling depressed, having a hard time functioning. Diagnosis:  Alcohol, Cocaine Dependence, Depression  ADL's:  Intact  Sleep: Poor  Appetite:  Fair  Suicidal Ideation:  Plan:  denies Intent:  denies Means:  denies Homicidal Ideation:  Plan:  denies Intent:  denies Means:  denies AEB (as evidenced by):  Psychiatric Specialty Exam: Review of Systems  Constitutional: Negative.   HENT: Negative.   Eyes: Negative.   Respiratory: Negative.   Cardiovascular: Negative.   Gastrointestinal: Negative.   Genitourinary: Negative.   Musculoskeletal: Negative.   Skin: Negative.   Neurological: Negative.   Endo/Heme/Allergies: Bruises/bleeds easily.  Psychiatric/Behavioral: Positive for depression and substance abuse. The patient is nervous/anxious and has insomnia.     Blood pressure 144/90, pulse 71, temperature 98.5 F (36.9 C), temperature source Oral, resp. rate 18, height 5\' 11"  (1.803 m), weight 102.513 kg (226 lb), SpO2 98.00%.Body mass index is 31.53 kg/(m^2).  General Appearance: Fairly Groomed  Patent attorney::  Fair  Speech:  Clear and Coherent, Slow and not spontaneous  Volume:  Decreased  Mood:  Depressed  Affect:  Depressed  Thought Process:  Coherent and Goal Directed  Orientation:  Full (Time, Place, and Person)  Thought Content:  worries, concern  Suicidal Thoughts:  No  Homicidal Thoughts:  No  Memory:  Immediate;   Fair Recent;   Fair Remote;   Fair  Judgement:  Fair  Insight:  Present  Psychomotor Activity:  Psychomotor Retardation  Concentration:  Fair  Recall:  Fair  Akathisia:  No  Handed:  Right  AIMS (if indicated):     Assets:  Desire  for Improvement  Sleep:  Number of Hours: 6.25   Current Medications: Current Facility-Administered Medications  Medication Dose Route Frequency Provider Last Rate Last Dose  . acetaminophen (TYLENOL) tablet 650 mg  650 mg Oral Q6H PRN Shuvon Rankin, NP   650 mg at 11/07/12 0848  . alum & mag hydroxide-simeth (MAALOX/MYLANTA) 200-200-20 MG/5ML suspension 30 mL  30 mL Oral Q4H PRN Shuvon Rankin, NP      . aspirin chewable tablet 81 mg  81 mg Oral Daily Shuvon Rankin, NP   81 mg at 11/08/12 0813  . atorvastatin (LIPITOR) tablet 40 mg  40 mg Oral q1800 Shuvon Rankin, NP   40 mg at 11/07/12 1834  . chlordiazePOXIDE (LIBRIUM) capsule 25 mg  25 mg Oral BH-qamhs Shuvon Rankin, NP       Followed by  . [START ON 11/10/2012] chlordiazePOXIDE (LIBRIUM) capsule 25 mg  25 mg Oral Daily Shuvon Rankin, NP      . escitalopram (LEXAPRO) tablet 20 mg  20 mg Oral Daily Shuvon Rankin, NP   20 mg at 11/08/12 0813  . folic acid (FOLVITE) tablet 1 mg  1 mg Oral Daily Shuvon Rankin, NP   1 mg at 11/08/12 0813  . glipiZIDE (GLUCOTROL) tablet 5 mg  5 mg Oral BID AC Shuvon Rankin, NP   5 mg at 11/08/12 0651  . haloperidol (HALDOL) tablet 2 mg  2 mg Oral Q2000 Sanjuana Kava, NP      . insulin aspart (novoLOG) injection 0-15 Units  0-15 Units Subcutaneous TID WC Sanjuana Kava, NP   2 Units at 11/08/12 1203  . magnesium hydroxide (MILK OF MAGNESIA) suspension 30 mL  30 mL Oral Daily PRN Shuvon Rankin, NP      . metFORMIN (GLUCOPHAGE) tablet 1,000 mg  1,000 mg Oral BID WC Sanjuana Kava, NP   1,000 mg at 11/08/12 1610  . multivitamin with minerals tablet 1 tablet  1 tablet Oral Daily Shuvon Rankin, NP   1 tablet at 11/08/12 0813  . thiamine (B-1) injection 100 mg  100 mg Intramuscular Once Shuvon Rankin, NP      . thiamine (VITAMIN B-1) tablet 100 mg  100 mg Oral Daily Shuvon Rankin, NP   100 mg at 11/08/12 0813  . verapamil (CALAN-SR) CR tablet 360 mg  360 mg Oral Daily Rachael Fee, MD   360 mg at 11/08/12 9604    Lab  Results:  Results for orders placed during the hospital encounter of 11/05/12 (from the past 48 hour(s))  GLUCOSE, CAPILLARY     Status: Abnormal   Collection Time    11/06/12  5:25 PM      Result Value Range   Glucose-Capillary 135 (*) 70 - 99 mg/dL  GLUCOSE, CAPILLARY     Status: Abnormal   Collection Time    11/06/12  9:50 PM      Result Value Range   Glucose-Capillary 224 (*) 70 - 99 mg/dL   Comment 1 Notify RN     Comment 2 Documented in Chart    GLUCOSE, CAPILLARY     Status: Abnormal   Collection Time    11/07/12  6:05 AM      Result Value Range   Glucose-Capillary 137 (*) 70 - 99 mg/dL  GLUCOSE, CAPILLARY     Status: Abnormal   Collection Time    11/07/12 11:50 AM      Result Value Range   Glucose-Capillary 102 (*) 70 - 99 mg/dL  GLUCOSE, CAPILLARY     Status: Abnormal   Collection Time    11/07/12  5:11 PM      Result Value Range   Glucose-Capillary 219 (*) 70 - 99 mg/dL  GLUCOSE, CAPILLARY     Status: Abnormal   Collection Time    11/07/12  9:32 PM      Result Value Range   Glucose-Capillary 132 (*) 70 - 99 mg/dL  GLUCOSE, CAPILLARY     Status: Abnormal   Collection Time    11/08/12  6:09 AM      Result Value Range   Glucose-Capillary 131 (*) 70 - 99 mg/dL  GLUCOSE, CAPILLARY     Status: Abnormal   Collection Time    11/08/12 11:42 AM      Result Value Range   Glucose-Capillary 138 (*) 70 - 99 mg/dL  GLUCOSE, CAPILLARY     Status: Abnormal   Collection Time    11/08/12  5:18 PM      Result Value Range   Glucose-Capillary 130 (*) 70 - 99 mg/dL    Physical Findings: AIMS: Facial and Oral Movements Muscles of Facial Expression: None, normal Lips and Perioral Area: None, normal Jaw: None, normal Tongue: None, normal,Extremity Movements Upper (arms, wrists, hands, fingers): None, normal Lower (legs, knees, ankles, toes): None, normal, Trunk Movements Neck, shoulders, hips: None, normal, Overall Severity Severity of abnormal movements (highest score  from questions above): None, normal Incapacitation due to abnormal movements: None, normal Patient's awareness of abnormal movements (rate only  patient's report): Aware, severe distress, Dental Status Current problems with teeth and/or dentures?: No Does patient usually wear dentures?: No  CIWA:  CIWA-Ar Total: 0 COWS:  COWS Total Score: 2  Treatment Plan Summary: Daily contact with patient to assess and evaluate symptoms and progress in treatment Medication management  Plan: Supportive approach/coping skills/relapse prevention           Finish detox/reassess co morbidities  Medical Decision Making Problem Points:  Review of psycho-social stressors (1) Data Points:  Review of medication regiment & side effects (2)  I certify that inpatient services furnished can reasonably be expected to improve the patient's condition.   Addley Ballinger A 11/08/2012, 5:24 PM

## 2012-11-08 NOTE — [facility] Suicide Risk Assessment (Signed)
[facility] INPATIENT: Family/Significant Other Suicide Prevention Education  Suicide Prevention Education:  Education Completed; No one has been identified by the patient as the family member/significant other with whom the patient will be residing, and identified as the person(s) who will aid the patient in the event of a mental health crisis (suicidal ideations/suicide attempt).   Pt did not c/o SI at admission, nor have they endorsed SI during their stay here. SPE not required.  [employer], LCSWA 11/08/2012 4:23 PM

## 2012-11-09 LAB — GLUCOSE, CAPILLARY: Glucose-Capillary: 136 mg/dL — ABNORMAL HIGH (ref 70–99)

## 2012-11-09 MED ORDER — BENZTROPINE MESYLATE 0.5 MG PO TABS
0.5000 mg | ORAL_TABLET | Freq: Two times a day (BID) | ORAL | Status: DC | PRN
  Filled 2012-11-09: qty 6
  Filled 2012-11-09: qty 28

## 2012-11-09 MED ORDER — ESCITALOPRAM OXALATE 20 MG PO TABS
20.0000 mg | ORAL_TABLET | Freq: Every day | ORAL | Status: DC
Start: 2012-11-09 — End: 2014-06-17

## 2012-11-09 MED ORDER — BENZTROPINE MESYLATE 0.5 MG PO TABS
0.5000 mg | ORAL_TABLET | Freq: Two times a day (BID) | ORAL | Status: DC | PRN
Start: 2012-11-09 — End: 2014-02-19

## 2012-11-09 MED ORDER — GLIPIZIDE 5 MG PO TABS: 5.0000 mg | ORAL_TABLET | Freq: Two times a day (BID) | ORAL | Status: DC

## 2012-11-09 MED ORDER — HALOPERIDOL 2 MG PO TABS: 2.0000 mg | ORAL_TABLET | Freq: Every day | ORAL | Status: DC

## 2012-11-09 MED ORDER — ASPIRIN 81 MG PO TABS: 81.0000 mg | ORAL_TABLET | Freq: Every day | ORAL | Status: DC

## 2012-11-09 MED ORDER — ATORVASTATIN CALCIUM 40 MG PO TABS: 40.0000 mg | ORAL_TABLET | Freq: Every day | ORAL | Status: DC

## 2012-11-09 MED ORDER — VERAPAMIL HCL ER 360 MG PO CP24: 360.0000 mg | ORAL_CAPSULE | Freq: Every day | ORAL | Status: DC

## 2012-11-09 MED ORDER — METFORMIN HCL 1000 MG PO TABS: 1000.0000 mg | ORAL_TABLET | Freq: Two times a day (BID) | ORAL | Status: DC

## 2012-11-09 NOTE — Progress Notes (Signed)
Adult Psychoeducational Group Note  Date:  11/09/2012 Time:  1:14 PM  Group Topic/Focus:  Recovery Goals:   The focus of this group is to identify appropriate goals for recovery and establish a plan to achieve them.  Participation Level:  Minimal  Participation Quality:  Appropriate  Affect:  Appropriate  Cognitive:  Appropriate  Insight: Appropriate  Engagement in Group:  Developing/Improving  Modes of Intervention:  Activity and Discussion  Additional Comments:  Patient attend group and remained appropriate. Patient was able to voluntarily contribute to the group discussion.   Rodman Pickle R 11/09/2012, 1:14 PM

## 2012-11-09 NOTE — Progress Notes (Signed)
Patient ID: Eduardo French, male   DOB: 01/15/1961, 52 y.o.   MRN: [id number] D- Patient reports he slept fair last night.  His appetite is good. His energy level is improving. His depression is 3 and hopelessness is 3.  He denies thoughts of self harm. He has been attending groups.  A- Talked with patient this am about setting a goal of having his cbg around 100 .  Talked with him about what he had for breakfast and possible upcoming snack  choices.  R- patient's cbg was below 100 at noon.  Congratulated him on this.

## 2012-11-09 NOTE — [facility] Group Notes (Signed)
[facility] LCSW Group Therapy  11/09/2012 3:51 PM  Type of Therapy:  Group Therapy  Participation Level:  Active  Participation Quality:  Attentive  Affect:  Appropriate  Cognitive:  Appropriate  Insight:  Engaged  Engagement in Therapy:  Engaged  Modes of Intervention:  Discussion, Education, Exploration, Socialization and Support  Summary of Progress/Problems: [facility] speaker did not come today. CSW handed out [facility] pamhlets and discussed groups, services, and other resources provided by [facility]. Group members were asked to share their personal experiences in group settings and process how this was helpful. Jashaun was interested in the Road to Recovery group offered by [facility] and asked questions about cost, orientation process. Verlon Au listened as others talked about their experiences in group settings but seemed focused more on talking about [facility] when asked to speak. Beryle was interested to learn more about the groups offered at this facility. He was encouraged to call [facility] to ask about these groups and was provided with a printout of information to read about the facility.   Smart, Cystal Shannahan 11/09/2012, 3:51 PM

## 2012-11-09 NOTE — Progress Notes (Signed)
Recreation Therapy Notes  Date: 07.08.2014 Time: 3:00pm Location: [address]      Group Topic/Focus: Communication, Team Work, Problem Solving  Participation Level: Did not attend.   Marykay Lex Kem Parcher, LRT/CTRS  Charee Tumblin L 11/09/2012 4:20 PM

## 2012-11-09 NOTE — Progress Notes (Signed)
[facility] MD Progress Note  11/09/2012 6:14 PM Eduardo French  MRN:  [id number] Subjective:  Trying to get his life together. Still feeling down, depressed. Completing his detox. He is worried about his mother and what can happen once he goes to rehab. He is concerned that she could die while he is there. He is committed to abstinence.  Diagnosis:  Alcohol, Cocaine Dependence, Depression  ADL's:  Intact  Sleep: Fair  Appetite:  Fair  Suicidal Ideation:  Plan:  denies Intent:  denies Means:  denies Homicidal Ideation:  Plan:  denies Intent:  denies Means:  denies AEB (as evidenced by):  Psychiatric Specialty Exam: Review of Systems  Constitutional: Negative.   HENT: Negative.   Eyes: Negative.   Respiratory: Negative.   Cardiovascular: Negative.   Gastrointestinal: Negative.   Genitourinary: Negative.   Musculoskeletal: Negative.   Skin: Negative.   Neurological: Negative.   Endo/Heme/Allergies: Negative.   Psychiatric/Behavioral: Positive for depression and substance abuse.    Blood pressure 132/82, pulse 72, temperature 98.5 F (36.9 C), temperature source Oral, resp. rate 16, height 5\' 11"  (1.803 m), weight 102.513 kg (226 lb), SpO2 98.00%.Body mass index is 31.53 kg/(m^2).  General Appearance: Fairly Groomed  Patent attorney::  Fair  Speech:  Clear and Coherent, Slow and not spontaneous  Volume:  Decreased  Mood:  Depressed  Affect:  Restricted  Thought Process:  Coherent and Goal Directed  Orientation:  Full (Time, Place, and Person)  Thought Content:  worries, concerns  Suicidal Thoughts:  No  Homicidal Thoughts:  No  Memory:  Immediate;   Fair Recent;   Fair Remote;   Fair  Judgement:  Fair  Insight:  Present  Psychomotor Activity:  Decreased  Concentration:  Fair  Recall:  Fair  Akathisia:  No  Handed:  Right  AIMS (if indicated):     Assets:  Desire for Improvement  Sleep:  Number of Hours: 6   Current Medications: Current Facility-Administered  Medications  Medication Dose Route Frequency Provider Last Rate Last Dose  . acetaminophen (TYLENOL) tablet 650 mg  650 mg Oral Q6H PRN Shuvon Rankin, NP   650 mg at 11/07/12 0848  . alum & mag hydroxide-simeth (MAALOX/MYLANTA) 200-200-20 MG/5ML suspension 30 mL  30 mL Oral Q4H PRN Shuvon Rankin, NP      . aspirin chewable tablet 81 mg  81 mg Oral Daily Shuvon Rankin, NP   81 mg at 11/09/12 0811  . atorvastatin (LIPITOR) tablet 40 mg  40 mg Oral q1800 Shuvon Rankin, NP   40 mg at 11/08/12 1809  . benztropine (COGENTIN) tablet 0.5 mg  0.5 mg Oral BID PRN Sanjuana Kava, NP      . Melene Muller ON 11/10/2012] chlordiazePOXIDE (LIBRIUM) capsule 25 mg  25 mg Oral Daily Shuvon Rankin, NP      . escitalopram (LEXAPRO) tablet 20 mg  20 mg Oral Daily Shuvon Rankin, NP   20 mg at 11/09/12 0810  . folic acid (FOLVITE) tablet 1 mg  1 mg Oral Daily Shuvon Rankin, NP   1 mg at 11/09/12 0811  . glipiZIDE (GLUCOTROL) tablet 5 mg  5 mg Oral BID AC Shuvon Rankin, NP   5 mg at 11/09/12 0631  . haloperidol (HALDOL) tablet 2 mg  2 mg Oral Q2000 Sanjuana Kava, NP   2 mg at 11/08/12 2152  . insulin aspart (novoLOG) injection 0-15 Units  0-15 Units Subcutaneous TID WC Sanjuana Kava, NP   2 Units at 11/09/12 [phone]  .  magnesium hydroxide (MILK OF MAGNESIA) suspension 30 mL  30 mL Oral Daily PRN Shuvon Rankin, NP      . metFORMIN (GLUCOPHAGE) tablet 1,000 mg  1,000 mg Oral BID WC Sanjuana Kava, NP   1,000 mg at 11/09/12 0810  . multivitamin with minerals tablet 1 tablet  1 tablet Oral Daily Shuvon Rankin, NP   1 tablet at 11/09/12 0811  . thiamine (B-1) injection 100 mg  100 mg Intramuscular Once Shuvon Rankin, NP      . thiamine (VITAMIN B-1) tablet 100 mg  100 mg Oral Daily Shuvon Rankin, NP   100 mg at 11/09/12 0811  . verapamil (CALAN-SR) CR tablet 360 mg  360 mg Oral Daily Rachael Fee, MD   360 mg at 11/09/12 4098    Lab Results:  Results for orders placed during the hospital encounter of 11/05/12 (from the past 48 hour(s))   GLUCOSE, CAPILLARY     Status: Abnormal   Collection Time    11/07/12  9:32 PM      Result Value Range   Glucose-Capillary 132 (*) 70 - 99 mg/dL  GLUCOSE, CAPILLARY     Status: Abnormal   Collection Time    11/08/12  6:09 AM      Result Value Range   Glucose-Capillary 131 (*) 70 - 99 mg/dL  GLUCOSE, CAPILLARY     Status: Abnormal   Collection Time    11/08/12 11:42 AM      Result Value Range   Glucose-Capillary 138 (*) 70 - 99 mg/dL  GLUCOSE, CAPILLARY     Status: Abnormal   Collection Time    11/08/12  5:18 PM      Result Value Range   Glucose-Capillary 130 (*) 70 - 99 mg/dL  GLUCOSE, CAPILLARY     Status: Abnormal   Collection Time    11/09/12  6:05 AM      Result Value Range   Glucose-Capillary 136 (*) 70 - 99 mg/dL  GLUCOSE, CAPILLARY     Status: None   Collection Time    11/09/12 12:03 PM      Result Value Range   Glucose-Capillary 96  70 - 99 mg/dL  GLUCOSE, CAPILLARY     Status: Abnormal   Collection Time    11/09/12  5:29 PM      Result Value Range   Glucose-Capillary 116 (*) 70 - 99 mg/dL    Physical Findings: AIMS: Facial and Oral Movements Muscles of Facial Expression: None, normal Lips and Perioral Area: None, normal Jaw: None, normal Tongue: None, normal,Extremity Movements Upper (arms, wrists, hands, fingers): None, normal Lower (legs, knees, ankles, toes): None, normal, Trunk Movements Neck, shoulders, hips: None, normal, Overall Severity Severity of abnormal movements (highest score from questions above): None, normal Incapacitation due to abnormal movements: None, normal Patient's awareness of abnormal movements (rate only patient's report): Aware, severe distress, Dental Status Current problems with teeth and/or dentures?: No Does patient usually wear dentures?: No  CIWA:  CIWA-Ar Total: 0 COWS:  COWS Total Score: 2  Treatment Plan Summary: Daily contact with patient to assess and evaluate symptoms and progress in treatment Medication  management  Plan: Supportive approach/coping skills/relapse prevention            Complete the detox           Help develop ways of dealing with his mother's illness and imminent death  Medical Decision Making Problem Points:  Review of psycho-social stressors (1) Data Points:  Review  of medication regiment & side effects (2)  I certify that inpatient services furnished can reasonably be expected to improve the patient's condition.   Avaeh Ewer A 11/09/2012, 6:14 PM

## 2012-11-09 NOTE — Discharge Planning (Signed)
Pt has [facility] date of Tues. July 15th. Confirmed by Trey Paula at [facility].

## 2012-11-09 NOTE — Progress Notes (Signed)
Recreation Therapy Notes  Date: 07.08.2014 Time: 2:30pm Location: [address] Dayroom      Group Topic/Focus: Musician (AAA/T)  Participation Level: Did not attend   [employer], LRT/CTRS  Joneisha Miles L 11/09/2012 4:00 PM

## 2012-11-09 NOTE — Progress Notes (Signed)
D - Pt alert and oriented x 4. Pt did not attend group tonight, pt has primarily remained in bed. Per pt self assessment sheet pt rates his depression 4, his hopelessness 4, and his anxiety 5. Pt appears moderately anxious with fine tremors to bilateral hands, pt also c/o of cravings. Pt denies SI/HI or any auditory or visual hallucinations.  A - Pt given support and encouragement through therapeutic conversation. Encouraged pt to speak with staff about any concerns or questions. Medications given as ordered.  R - Pt safety maintained with Q 15 minute rounds. Pt remains safe on the unit.

## 2012-11-09 NOTE — Discharge Summary (Signed)
Physician Discharge Summary Note  Patient:  Eduardo French is an 52 y.o., male MRN:  [id number] DOB:  12/31/1960 Patient phone:  [phone] (home)  Patient address:   [address] [place] [address],   Date of Admission:  11/05/2012  Date of Discharge: 11/09/12  Reason for Admission:  Alcohol/cocaine intoxication  Discharge Diagnoses: Principal Problem:   Alcohol dependence Active Problems:   Cocaine dependence   Paranoid schizophrenia, chronic condition  Review of Systems  Constitutional: Negative.   HENT: Negative.   Eyes: Negative.   Respiratory: Negative.   Cardiovascular: Negative.   Gastrointestinal: Negative.   Genitourinary: Negative.   Musculoskeletal: Negative.   Skin: Negative.   Neurological: Negative.   Endo/Heme/Allergies: Negative.   Psychiatric/Behavioral: Positive for depression (Stabilized with medication prior to discharge) and substance abuse (Alcohol and cocaine dependence). Negative for suicidal ideas, hallucinations and memory loss. The patient is nervous/anxious (Stabilized with medication prior to discharge). The patient does not have insomnia.    Axis Diagnosis:   AXIS I:  Alcohol dependence, Cocaine dependence, Paranoid schizophrenia AXIS II:  Deferred AXIS III:   Past Medical History  Diagnosis Date  . Hypertension   . Depression   . Diabetes mellitus without complication   . S/P cardiac catheterization, 09/14/12 with mild non obstructive CAD and EF of 55% 09/15/2012  . DJD (degenerative joint disease) of cervical spine     on disability  . Bipolar disorder    AXIS IV:  other psychosocial or environmental problems and Polysubstance dependence AXIS V:  64  Level of Care:  OP  Hospital Course:  This is a 52 year old African-American Male. Admitted to [facility] from the [facility] with complaints of alcohol intoxication and cocaine abuse requesting detoxification treatment. Patient reports, "I went to the hospital ED on  Thursday evening. I was feeling very depressed. Also, I have drug and alcohol problems. I have been battling substance abuse issues for most of my life. So, I have decided to take this step to stop and may be get my wife back. I have been an alcoholic for a long time. I started smoking weed at 15, then stopped in my 55s, but continued with alcohol till date. I started using cocaine in my early 30s including heroin. Later, I started snorting cocaine. I have been cleaned several times in my life, but will usually relapse from 8 months to a year of sobriety. My longest time of sobriety was 16 months, and that was a long time ago. I have had drug charges, DUIS and DWI. I was also diagnosed with Schizophrenia, chronic depression and bipolar disorder 2 years ago. I'm still hearing voices and seeing stuff till this day".  After admission assessment and evaluation, it was determined that Mr. Eduardo French will need detoxification treatment protocol to stabilize his system of alcohol intoxication and to combat the withdrawal symptoms of alcohol. And his discharge plans included a referral to a long term treatment facility [facility]) for a more intense substance abuse treatment. Mr. Eduardo French was then started on Librium protocol for his alcohol detoxification. He was also enrolled in group counseling sessions and activities where he was taught, counseled and learned coping skills that should help him after discharge to cope better, manage his substance abuse problems to maintain a much longer sobriety. However, Mr. Eduardo French was noted to be complaining of auditory/visual hallucinations as he history of paranoid schizophrenia.  He was on Latuda 40 mg daily for mood control. After he  was resumed on Latuda 40 mg, patient reported increased drowsiness and a spike in his blood blood sugar levels. He was then switched to haldol 2 mg Q bedtime for mood control, while Laduda was discontinued.  Besides the detoxification treatment  protocol, patient also was ordered and received Lexapro 20 mg Q daily for depression, Haldol 2 mg daily for mood control and Cogentin 0.5 mg bid for prevention of EPS. He was also enrolled and attended AA/NA meetings being offered and held on this unit. He has some previous and or identifiable medical conditions that required treatment and or monitoring. He received medication management for all those health issues as well. He was monitored closely for any potential problems that may arise as a result of and or during detoxification treatment. Patient tolerated his treatment regimen and detoxification treatment protocol without any significant adverse effects and or reactions reported.  Patient attended treatment team meeting this am and met with the treatment team members. His reason for admission, present symptoms, substance abuse issues, response to treatment and discharge plans discussed. Patient endorsed that he is doing well and stable for discharge to pursue the next phase of his substance abuse treatment. It was agreed upon that he will continue substance abuse treatment at the [facility] on [address] here in [place], [place] on 11/16/12. He is instructed to call [facility] on 11/15/13 for reminder to pick him up at the [facility] lot on [address] on 11/16/12 at 07:45 am. And for medication management and routine psychiatric care, patient will be following at [facility] psychiatric clinic here in [place], [place]. He has been instructed that his appointment with the [facility] clinic is Walk-in appointment. The addresses, dates, times and contact information for these clinic and treatment center provided for patient in writing. In addition to residential substance abuse treatment, Mr. Eduardo French is encouraged to join/attend AA/NA meetings being offered and held within his community for a much needed group support.Marland Kitchen   Upon discharge, patient adamantly denies suicidal, homicidal ideations,  auditory, visual hallucinations, delusional thinking and or withdrawal symptoms. Patient left [facility] with all personal belongings in no apparent distress. He received 4 days worth supply samples of his discharge medications. Transportation per son.  Consults:  psychiatry  Significant Diagnostic Studies:  labs: CBC with diff, CMP, UDS, Toxicology tests, U/A  Discharge Vitals:   Blood pressure 122/83, pulse 67, temperature 98.5 F (36.9 C), temperature source Oral, resp. rate 18, height 5\' 11"  (1.803 m), weight 102.513 kg (226 lb), SpO2 98.00%. Body mass index is 31.53 kg/(m^2). Lab Results:   Results for orders placed during the hospital encounter of 11/05/12 (from the past 72 hour(s))  GLUCOSE, CAPILLARY     Status: Abnormal   Collection Time    11/07/12  5:11 PM      Result Value Range   Glucose-Capillary 219 (*) 70 - 99 mg/dL  GLUCOSE, CAPILLARY     Status: Abnormal   Collection Time    11/07/12  9:32 PM      Result Value Range   Glucose-Capillary 132 (*) 70 - 99 mg/dL  GLUCOSE, CAPILLARY     Status: Abnormal   Collection Time    11/08/12  6:09 AM      Result Value Range   Glucose-Capillary 131 (*) 70 - 99 mg/dL  GLUCOSE, CAPILLARY     Status: Abnormal   Collection Time    11/08/12 11:42 AM      Result Value Range   Glucose-Capillary 138 (*) 70 -  99 mg/dL  GLUCOSE, CAPILLARY     Status: Abnormal   Collection Time    11/08/12  5:18 PM      Result Value Range   Glucose-Capillary 130 (*) 70 - 99 mg/dL  GLUCOSE, CAPILLARY     Status: Abnormal   Collection Time    11/09/12  6:05 AM      Result Value Range   Glucose-Capillary 136 (*) 70 - 99 mg/dL  GLUCOSE, CAPILLARY     Status: None   Collection Time    11/09/12 12:03 PM      Result Value Range   Glucose-Capillary 96  70 - 99 mg/dL  GLUCOSE, CAPILLARY     Status: Abnormal   Collection Time    11/09/12  5:29 PM      Result Value Range   Glucose-Capillary 116 (*) 70 - 99 mg/dL  GLUCOSE, CAPILLARY     Status: Abnormal    Collection Time    11/10/12  6:01 AM      Result Value Range   Glucose-Capillary 118 (*) 70 - 99 mg/dL  GLUCOSE, CAPILLARY     Status: None   Collection Time    11/10/12 11:57 AM      Result Value Range   Glucose-Capillary 90  70 - 99 mg/dL    Physical Findings: AIMS: Facial and Oral Movements Muscles of Facial Expression: None, normal Lips and Perioral Area: None, normal Jaw: None, normal Tongue: None, normal,Extremity Movements Upper (arms, wrists, hands, fingers): None, normal Lower (legs, knees, ankles, toes): None, normal, Trunk Movements Neck, shoulders, hips: None, normal, Overall Severity Severity of abnormal movements (highest score from questions above): None, normal Incapacitation due to abnormal movements: None, normal Patient's awareness of abnormal movements (rate only patient's report): No Awareness, Dental Status Current problems with teeth and/or dentures?: No Does patient usually wear dentures?: No  CIWA:  CIWA-Ar Total: 0 COWS:  COWS Total Score: 2  Psychiatric Specialty Exam: See Psychiatric Specialty Exam and Suicide Risk Assessment completed by Attending Physician prior to discharge.  Discharge destination:  Home  Is patient on multiple antipsychotic therapies at discharge:  No   Has Patient had three or more failed trials of antipsychotic monotherapy by history:  No  Recommended Plan for Multiple Antipsychotic Therapies: NA     Medication List    STOP taking these medications       lurasidone 40 MG Tabs  Commonly known as:  LATUDA     oxyCODONE-acetaminophen 10-325 MG per tablet  Commonly known as:  PERCOCET      TAKE these medications     Indication   aspirin 81 MG tablet  Take 1 tablet (81 mg total) by mouth daily. For heart health   Indication:  Heart health     atorvastatin 40 MG tablet  Commonly known as:  LIPITOR  Take 1 tablet (40 mg total) by mouth daily at 6 PM. For high cholesterol control   Indication:  Inherited  Heterozygous Hypercholesterolemia, Inherited Homozygous Hypercholesterolemia, Type II B Hyperlipidemia     benztropine 0.5 MG tablet  Commonly known as:  COGENTIN  Take 1 tablet (0.5 mg total) by mouth 2 (two) times daily as needed. For prevention involuntary movement associated with Haldol   Indication:  Extrapyramidal Reaction caused by Medications     escitalopram 20 MG tablet  Commonly known as:  LEXAPRO  Take 1 tablet (20 mg total) by mouth daily. For depression   Indication:  Depression, Generalized Anxiety Disorder  glipiZIDE 5 MG tablet  Commonly known as:  GLUCOTROL  Take 1 tablet (5 mg total) by mouth 2 (two) times daily before a meal. For high blood sugar control   Indication:  Type 2 Diabetes     haloperidol 2 MG tablet  Commonly known as:  HALDOL  Take 1 tablet (2 mg total) by mouth daily at 8 pm. For mood control   Indication:  Schizophrenia, For mood control     metFORMIN 1000 MG tablet  Commonly known as:  GLUCOPHAGE  Take 1 tablet (1,000 mg total) by mouth 2 (two) times daily with a meal. For high blood sugar control   Indication:  Type 2 Diabetes     verapamil 360 MG 24 hr capsule  Commonly known as:  VERELAN PM  Take 1 capsule (360 mg total) by mouth daily. For high blood sugar control   Indication:  High Blood Pressure of Unknown Cause       Follow-up Information   Follow up with [facility]. (Arrive to [facility] by 8AM. If you take the bus, call [facility] Monday to let them know you need to be picked up at the [facility] on [address] Tues morning. Bring ID, 30 day med supply, and 5 days worth of clothing. )    Contact information:   [address]  [place], [place] [address] phone: [phone] fax; [phone]      Follow up with [facility] . (Walk in between 8AM-9AM Monday through Friday for hospital followup/medication management.)    Contact information:   [address], [place] [address] phone: [phone] fax: [phone]      Follow-up recommendations:  Activity:  As tolerated Diet: As recommended by your primary care doctor. Keep all scheduled follow-up appointments as recommended. Continue to work the relapse prevention plan Comments: Take all your medications as prescribed by your mental healthcare provider. Report any adverse effects and or reactions from your medicines to your outpatient provider promptly. Patient is instructed and cautioned to not engage in alcohol and or illegal drug use while on prescription medicines. In the event of worsening symptoms, patient is instructed to call the crisis hotline, 911 and or go to the nearest ED for appropriate evaluation and treatment of symptoms. Follow-up with your primary care provider for your other medical issues, concerns and or health care needs.   Total Discharge Time:  Greater than 30 minutes.  Signed: Sanjuana Kava, PMHNP-BC 11/10/2012, 4:11 PM Agree with assessment and plan Madie Reno A. Dub Mikes, M.D.

## 2012-11-09 NOTE — [facility] Group Notes (Signed)
[facility] LCSW Aftercare Discharge Planning Group Note   11/09/2012 10:02 AM  Participation Quality:  Appropriate   Mood/Affect:  Appropriate  Depression Rating:  0  Anxiety Rating:  0  Thoughts of Suicide:  No Will you contract for safety?   NA  Current AVH:  No  Plan for Discharge/Comments:  Pt interested in [facility] admission. Has [facility] date of July 10th (Thursday). Pt upset that he does not have more options with Medicaid. He also requested to have a day to spend with his mother before entering 30 day program.   Transportation Means: bus/friend   Supports: some family  Smart, Research scientist (physical sciences)

## 2012-11-10 DIAGNOSIS — F200 Paranoid schizophrenia: Secondary | ICD-10-CM

## 2012-11-10 LAB — GLUCOSE, CAPILLARY
Glucose-Capillary: 90 mg/dL (ref 70–99)
Glucose-Capillary: 89 mg/dL (ref 70–99)

## 2012-11-10 NOTE — [facility] Group Notes (Signed)
[facility] LCSW Group Therapy  11/10/2012 3:41 PM  Type of Therapy:  Group Therapy  Participation Level:  Active  Participation Quality:  Attentive  Affect:  Appropriate  Cognitive:  Appropriate  Insight:  Engaged  Engagement in Therapy:  Engaged  Modes of Intervention:  Discussion, Education, Exploration, Socialization and Support  Summary of Progress/Problems: Emotion Regulation: This group focused on both positive and negative emotion identification and allowed group members to process ways to identify feelings, regulate negative emotions, and find healthy ways to manage internal/external emotions. Group members were asked to reflect on a time when their reaction to an emotion led to a negative outcome and explored how alternative responses using emotion regulation would have benefited them. Group members were also asked to discuss a time when emotion regulation was utilized when a negative emotion was experienced. Eduardo French sat quietly though the majority of group and listened as others listed emotions they struggle with and identified these emotions through phsycial and mental signals. Eduardo French shared a personal story of when he allowed alcohol to consume his life and take charge of his emotions. He processed how changing his way of thinking is vital for recovery and for battling alcoholism. "I see that alcohol is not my friend, it's my enemy." "I've been defending my enemy this whole time and either my enemy kills me or I kill it." Eduardo French and other group members processed how to deal with the negative emotions brought on by alcohol/substance abuse and explored how to deal with impulsive thoughts and actions.     Eduardo French, Eduardo French 11/10/2012, 3:41 PM

## 2012-11-10 NOTE — Progress Notes (Signed)
Patient ID: Eduardo French, male   DOB: 07/29/1960, 52 y.o.   MRN: [id number]   D: Pt was laying in the bed during the assessment. When asked about his day pt stated, "I had a good day". Stated he plans to get up in the morning for breakfast. Pt was slightly inappropriate when he jokingly stated that he was going to take the writer to breakfast.   A:  Support and encouragement was offered. 15 min checks continued for safety.  R: Pt remains safe.

## 2012-11-10 NOTE — [facility] Group Notes (Signed)
[facility] LCSW Aftercare Discharge Planning Group Note   11/10/2012 9:36 AM  Participation Quality:  Appropriate   Mood/Affect:  Appropriate  Depression Rating:  3  Anxiety Rating:  3  Thoughts of Suicide:  No Will you contract for safety?   NA  Current AVH:  No  Plan for Discharge/Comments:  Pt has [facility] date of Tues July 15th. He will stay with his son or a friend until admission date and will followup at [facility] for med management. Pt aware he needs ID, five days worth of clothes, and 30 day med supply for [facility] admission. Pt also aware that if he takes bus, he will need to notify [facility] on Monday to ensure they will pick him up at the [facility] on [address]. Pt to d/c today.   Transportation Means: son  Supports: son/limited family  Smart, Research scientist (physical sciences)

## 2012-11-10 NOTE — [facility] Suicide Risk Assessment (Signed)
Suicide Risk Assessment  Discharge Assessment     Demographic Factors:  Male  Mental Status Per Nursing Assessment::   On Admission:     Current Mental Status by Physician: In full contact with reality. There are no suicidal ideas, plans or intent. His mood is "much better" affect is appropriate. He states he is ready to go. His son is going to pick him up today. He is going to see his mother afterwards. He is ready to deal with her imminent death. Has a relapse prevention plan in place   Loss Factors: anticipates the death of his mother  Historical Factors: NA  Risk Reduction Factors:   Sense of responsibility to family and Positive social support  Continued Clinical Symptoms:  Depression:   Comorbid alcohol abuse/dependence Alcohol/Substance Abuse/Dependencies  Cognitive Features That Contribute To Risk:  Closed-mindedness Polarized thinking Thought constriction (tunnel vision)    Suicide Risk:  Minimal: No identifiable suicidal ideation.  Patients presenting with no risk factors but with morbid ruminations; may be classified as minimal risk based on the severity of the depressive symptoms  Discharge Diagnoses:   AXIS I:  Alcohol Dependence, Cocaine Dependence, Bipolar Depressed AXIS II:  Deferred AXIS III:   Past Medical History  Diagnosis Date  . Hypertension   . Depression   . Diabetes mellitus without complication   . S/P cardiac catheterization, 09/14/12 with mild non obstructive CAD and EF of 55% 09/15/2012  . DJD (degenerative joint disease) of cervical spine     on disability  . Bipolar disorder    AXIS IV:  other psychosocial or environmental problems AXIS V:  61-70 mild symptoms  Plan Of Care/Follow-up recommendations:  Activity:  as tolerated Diet:  regular Follow up [facility] Is patient on multiple antipsychotic therapies at discharge:  No   Has Patient had three or more failed trials of antipsychotic monotherapy by history:   No  Recommended Plan for Multiple Antipsychotic Therapies: N/A   Eduardo French A 11/10/2012, 12:38 PM

## 2012-11-10 NOTE — Progress Notes (Signed)
Eduardo French was discharged home with his son.  Discharge instructions, prescriptions, follow up information, and sample medications given and patient verbalized understanding.  No SI/HI/AVH at this time.  Personal belongings were returned to patient.

## 2012-11-10 NOTE — ED Provider Notes (Signed)
Medical screening examination/treatment/procedure(s) were conducted as a shared visit with non-physician practitioner(s) and myself.  I personally evaluated the patient during the encounter  Derwood Kaplan, MD 11/10/12 [phone]

## 2012-11-10 NOTE — Progress Notes (Signed)
[facility] Adult Case Management Discharge Plan :  Will you be returning to the same living situation after discharge: No. At discharge, do you have transportation home?:Yes,  son Do you have the ability to pay for your medications:Yes,  [employer]  Release of information consent forms completed and in the chart;  Patient's signature needed at discharge.  Patient to Follow up at: Follow-up Information   Follow up with [facility]. (Arrive to [facility] by 8AM. If you take the bus, call [facility] Monday to let them know you need to be picked up at the [facility] on [address] morning. Bring ID, 30 day med supply, and 5 days worth of clothing. )    Contact information:   [address]  [place], [place] [address] phone: [phone] fax; [phone]      Patient denies SI/HI:   Yes,  in group    Safety Planning and Suicide Prevention discussed:  Yes,  pt did not endorse SI during admission or during stay at [facility]. Pt provided with SPI pamphlet and encouraged to ask questions/talk about concerns relating to SI.   Smart, Kaytelynn Scripter 11/10/2012, 10:23 AM

## 2012-11-10 NOTE — Progress Notes (Signed)
[facility] Adult Case Management Discharge Plan :  Will you be returning to the same living situation after discharge: No. At discharge, do you have transportation home?:Yes,  son Do you have the ability to pay for your medications:Yes,  Medicaid  Release of information consent forms completed and in the chart;  Patient's signature needed at discharge.  Patient to Follow up at: Follow-up Information   Follow up with [facility]. (Arrive to [facility] by 8AM. If you take the bus, call [facility] Monday to let them know you need to be picked up at the [facility] on [address] morning. Bring ID, 30 day med supply, and 5 days worth of clothing. )    Contact information:   [address]  [place], [place] [address] phone: [phone] fax; [phone]      Follow up with [facility] . (Walk in between 8AM-9AM Monday through Friday for hospital followup/medication management.)    Contact information:   [address], [place] [address] phone: [phone] fax: [phone]      Patient denies SI/HI:   Yes,  in group/self report    Safety Planning and Suicide Prevention discussed:  Yes,  Yes,  pt did not endorse SI during admission or during stay at [facility]. Pt provided with SPI pamphlet and encouraged to ask questions/talk about concerns relating to SI.   Smart, Leisl Spurrier 11/10/2012, 11:19 AM

## 2012-11-12 NOTE — Progress Notes (Signed)
Patient Discharge Instructions:  After Visit Summary (AVS):   Faxed to:  11/12/12 Discharge Summary Note:   Faxed to:  11/12/12 Psychiatric Admission Assessment Note:   Faxed to:  11/12/12 Suicide Risk Assessment - Discharge Assessment:   Faxed to:  11/12/12 Faxed/Sent to the [facility] Care provider:  11/12/12 Faxed to [facility] @ [phone] Faxed to [facility] @ [phone]  Jerelene Redden, 11/12/2012, 3:32 PM

## 2013-01-12 ENCOUNTER — Other Ambulatory Visit: Payer: Self-pay | Admitting: Internal Medicine

## 2013-01-12 MED ORDER — METFORMIN HCL 1000 MG PO TABS: 1000.0000 mg | ORAL_TABLET | Freq: Two times a day (BID) | ORAL | Status: DC

## 2013-04-11 ENCOUNTER — Encounter: Payer: Self-pay | Admitting: Cardiovascular Disease

## 2013-04-11 ENCOUNTER — Ambulatory Visit (INDEPENDENT_AMBULATORY_CARE_PROVIDER_SITE_OTHER): Payer: Medicaid Other | Admitting: Cardiovascular Disease

## 2013-04-11 ENCOUNTER — Other Ambulatory Visit: Payer: Self-pay | Admitting: Cardiovascular Disease

## 2013-04-11 VITALS — BP 140/80 | HR 78 | Ht 71.5 in | Wt 223.1 lb

## 2013-04-11 DIAGNOSIS — I10 Essential (primary) hypertension: Secondary | ICD-10-CM

## 2013-04-11 DIAGNOSIS — E119 Type 2 diabetes mellitus without complications: Secondary | ICD-10-CM

## 2013-04-11 DIAGNOSIS — E669 Obesity, unspecified: Secondary | ICD-10-CM

## 2013-04-11 DIAGNOSIS — I2510 Atherosclerotic heart disease of native coronary artery without angina pectoris: Secondary | ICD-10-CM

## 2013-04-11 DIAGNOSIS — E66811 Obesity, class 1: Secondary | ICD-10-CM

## 2013-04-11 DIAGNOSIS — Z79899 Other long term (current) drug therapy: Secondary | ICD-10-CM

## 2013-04-11 DIAGNOSIS — E785 Hyperlipidemia, unspecified: Secondary | ICD-10-CM

## 2013-04-11 HISTORY — DX: Obesity, unspecified: E66.9

## 2013-04-11 HISTORY — DX: Obesity, class 1: E66.811

## 2013-04-11 LAB — COMPREHENSIVE METABOLIC PANEL
ALT: 22 U/L (ref 0–53)
AST: 16 U/L (ref 0–37)
Albumin: 4.5 g/dL (ref 3.5–5.2)
BUN: 9 mg/dL (ref 6–23)
Calcium: 9.9 mg/dL (ref 8.4–10.5)
Chloride: 106 mEq/L (ref 96–112)
Potassium: 4.3 mEq/L (ref 3.5–5.3)
Sodium: 138 mEq/L (ref 135–145)
Total Protein: 7.2 g/dL (ref 6.0–8.3)

## 2013-04-11 NOTE — Assessment & Plan Note (Signed)
We'll also recheck a hemoglobin A1c. Target 6-7%. I suspect glycemic control will still be inadequate since his morning blood sugar is never less than 135.

## 2013-04-11 NOTE — Assessment & Plan Note (Signed)
6 months since he started statin therapy. He'll have lab tests done today.

## 2013-04-11 NOTE — Assessment & Plan Note (Signed)
We spent a long time discussing the adverse effects of obesity on his blood pressure and blood chemistries. I am convinced that if he lost substantial weight (about 25 pounds) he would see marked improvement in his diabetes and lipid profile. There is even a chance he could discontinue diabetes medications with sustained weight loss and physical activity. We reviewed important changes in his diet.

## 2013-04-11 NOTE — Assessment & Plan Note (Signed)
Borderline high today, will improve when you resume his lisinopril. Even if he had normal blood pressure, and ACE inhibitor would be beneficial for renal protection.

## 2013-04-11 NOTE — Progress Notes (Signed)
Patient ID: Eduardo French, male   DOB: 09-May-1960, 52 y.o.   MRN: [id number]      Reason for office visit DM, hyperlipidemia, hypertension, minor CAD  Eduardo French returns for routine followup and has not been troubled by anginal chest pain since his last appointment. He has successfully quit smoking and states that he is abstinent from alcohol and recreational drugs. He had coronary angiography in May of this year with mild nonobstructive CAD. He has a brother who had an LVAD and heart transplant, events that made him more conscious of his own heart health. He has occasional musculoskeletal sounding twinges in his chest. He is mostly bothered by pain in his right shoulder where he has significant arthritis. He walks almost daily. He ran out of lisinopril about 6 days ago but plans to restart it today.  No Known Allergies  Current Outpatient Prescriptions  Medication Sig Dispense Refill  . aspirin 81 MG tablet Take 1 tablet (81 mg total) by mouth daily. For heart health  30 tablet    . atorvastatin (LIPITOR) 40 MG tablet Take 1 tablet (40 mg total) by mouth daily at 6 PM. For high cholesterol control  30 tablet  6  . benztropine (COGENTIN) 0.5 MG tablet Take 1 tablet (0.5 mg total) by mouth 2 (two) times daily as needed. For prevention involuntary movement associated with Haldol  60 tablet  0  . escitalopram (LEXAPRO) 20 MG tablet Take 1 tablet (20 mg total) by mouth daily. For depression  30 tablet  0  . glipiZIDE (GLUCOTROL) 5 MG tablet Take 1 tablet (5 mg total) by mouth 2 (two) times daily before a meal. For high blood sugar control      . lisinopril (PRINIVIL,ZESTRIL) 40 MG tablet Take 40 mg by mouth daily.      . metFORMIN (GLUCOPHAGE) 1000 MG tablet Take 1 tablet (1,000 mg total) by mouth 2 (two) times daily with a meal. For high blood sugar control  60 tablet  0  . oxyCODONE-acetaminophen (PERCOCET) 10-325 MG per tablet Take 1 tablet by mouth every 8 (eight) hours as needed for pain.        No current facility-administered medications for this visit.    Past Medical History  Diagnosis Date  . Hypertension   . Depression   . Diabetes mellitus without complication   . S/P cardiac catheterization, 09/14/12 with mild non obstructive CAD and EF of 55% 09/15/2012  . DJD (degenerative joint disease) of cervical spine     on disability  . Bipolar disorder     Past Surgical History  Procedure Laterality Date  . Back surgery      Family History  Problem Relation Age of Onset  . Heart disease Brother     has had heart transplant  . Diabetes Mother   . Cancer Mother   . Hypertension Mother   . Cancer Brother   . Diabetes Brother   . Cancer Brother     History   Social History  . Marital Status: Legally Separated    Spouse Name: N/A    Number of Children: N/A  . Years of Education: N/A   Occupational History  . Not on file.   Social History Main Topics  . Smoking status: Former Smoker -- 0.25 packs/day for 20 years    Types: Cigarettes    Quit date: 10/11/2012  . Smokeless tobacco: Never Used  . Alcohol Use: 7.8 oz/week    3 Shots of liquor, 10  Cans of beer per week     Comment: 1x week  . Drug Use: No  . Sexual Activity: Yes   Other Topics Concern  . Not on file   Social History Narrative  . No narrative on file    Review of systems: The patient specifically denies any chest pain at rest or with exertion, dyspnea at rest or with exertion, orthopnea, paroxysmal nocturnal dyspnea, syncope, palpitations, focal neurological deficits, intermittent claudication, lower extremity edema, unexplained weight gain, cough, hemoptysis or wheezing.  The patient also denies abdominal pain, nausea, vomiting, dysphagia, diarrhea, constipation, polyuria, polydipsia, dysuria, hematuria, frequency, urgency, abnormal bleeding or bruising, fever, chills, unexpected weight changes, mood swings, change in skin or hair texture, change in voice quality, auditory or visual  problems, allergic reactions or rashes, new musculoskeletal complaints other than usual "aches and pains".   PHYSICAL EXAM BP 140/80  Pulse 78  Ht 5' 11.5" (1.816 m)  Wt 223 lb 1.6 oz (101.197 kg)  BMI 30.69 kg/m2  General: Alert, oriented x3, no distress Head: no evidence of trauma, PERRL, EOMI, no exophtalmos or lid lag, no myxedema, no xanthelasma; normal ears, nose and oropharynx Neck: normal jugular venous pulsations and no hepatojugular reflux; brisk carotid pulses without delay and no carotid bruits Chest: clear to auscultation, no signs of consolidation by percussion or palpation, normal fremitus, symmetrical and full respiratory excursions Cardiovascular: normal position and quality of the apical impulse, regular rhythm, normal first and second heart sounds, no murmurs, rubs or gallops Abdomen: no tenderness or distention, no masses by palpation, no abnormal pulsatility or arterial bruits, normal bowel sounds, no hepatosplenomegaly Extremities: no clubbing, cyanosis or edema; 2+ radial, ulnar and brachial pulses bilaterally; 2+ right femoral, posterior tibial and dorsalis pedis pulses; 2+ left femoral, posterior tibial and dorsalis pedis pulses; no subclavian or femoral bruits Neurological: grossly nonfocal   EKG: Normal sinus rhythm, nonspecific T-wave flattening in the inferior leads and V6 unchanged from previous tracing  Lipid Panel     Component Value Date/Time   CHOL 242* 09/13/2012 0131   TRIG 328* 09/13/2012 0131   HDL 37* 09/13/2012 0131   CHOLHDL 6.5 09/13/2012 0131   VLDL 66* 09/13/2012 0131   LDLCALC 139* 09/13/2012 0131    BMET    Component Value Date/Time   NA 140 11/04/2012 1935   K 3.8 11/04/2012 1935   CL 106 11/04/2012 1935   CO2 21 11/04/2012 1935   GLUCOSE 265* 11/04/2012 1935   BUN 11 11/04/2012 1935   CREATININE 1.09 11/04/2012 1935   CALCIUM 9.1 11/04/2012 1935   GFRNONAA 77* 11/04/2012 1935   GFRAA 89* 11/04/2012 1935     ASSESSMENT AND PLAN Dyslipidemia- (LDL  190 in 2011) 6 months since he started statin therapy. He'll have lab tests done today.  DM type 2 (diabetes mellitus, type 2) We'll also recheck a hemoglobin A1c. Target 6-7%. I suspect glycemic control will still be inadequate since his morning blood sugar is never less than 135.  HTN (hypertension), benign Borderline high today, will improve when you resume his lisinopril. Even if he had normal blood pressure, and ACE inhibitor would be beneficial for renal protection.  Obesity (BMI 30.0-34.9) We spent a long time discussing the adverse effects of obesity on his blood pressure and blood chemistries. I am convinced that if he lost substantial weight (about 25 pounds) he would see marked improvement in his diabetes and lipid profile. There is even a chance he could discontinue diabetes medications with sustained  weight loss and physical activity. We reviewed important changes in his diet.   Orders Placed This Encounter  Procedures  . Comp Met (CMET)  . HgB A1c  . Cholesterol, Total  . EKG 12-Lead   Meds ordered this encounter  Medications  . oxyCODONE-acetaminophen (PERCOCET) 10-325 MG per tablet    Sig: Take 1 tablet by mouth every 8 (eight) hours as needed for pain.  Marland Kitchen lisinopril (PRINIVIL,ZESTRIL) 40 MG tablet    Sig: Take 40 mg by mouth daily.    Junious Silk, MD, [facility] [phone] office [phone] pager

## 2013-04-11 NOTE — Patient Instructions (Signed)
Your physician encouraged you to lose weight for better health.  Your physician discussed the importance of regular exercise and recommended that you start or continue a regular exercise program for good health.  Your physician recommends that you return for lab work today  Your physician recommends that you schedule a follow-up appointment in: 6 months

## 2013-04-13 LAB — LIPID PANEL
LDL Cholesterol: 158 mg/dL — ABNORMAL HIGH (ref 0–99)
Total CHOL/HDL Ratio: 4.8 Ratio
VLDL: 38 mg/dL (ref 0–40)

## 2013-04-14 ENCOUNTER — Telehealth: Payer: Self-pay | Admitting: *Deleted

## 2013-04-14 DIAGNOSIS — Z79899 Other long term (current) drug therapy: Secondary | ICD-10-CM

## 2013-04-14 DIAGNOSIS — E782 Mixed hyperlipidemia: Secondary | ICD-10-CM

## 2013-04-14 MED ORDER — EZETIMIBE 10 MG PO TABS: 10.0000 mg | ORAL_TABLET | Freq: Every day | ORAL | Status: DC

## 2013-04-14 NOTE — Telephone Encounter (Signed)
Message copied by Vita Barley on Thu Apr 14, 2013 10:09 AM ------      Message from: Thurmon Fair      Created: Wed Apr 13, 2013  5:50 PM       Cholesterol is too high. Please confirm that he is compliant with atorvastatin. If he is, add zetia 10 mg daily and recheck in 3 months. If he is not, tell him he should take the statin daily without fail. ------

## 2013-04-14 NOTE — Telephone Encounter (Signed)
Lab results called to patient.  Taking Atorvastatin every day.  Will start Zetia 10mg  qd.  Sent to pharmacy and will get labs rechecked in 3 months.

## 2013-04-18 ENCOUNTER — Telehealth: Payer: Self-pay | Admitting: *Deleted

## 2013-04-18 NOTE — Telephone Encounter (Signed)
zetia 10mg  qd requiring prior authorization.  Called [phone] to start the process.  Info given for consideration.  A determination will be made within 48 hours.

## 2014-02-19 ENCOUNTER — Emergency Department (HOSPITAL_COMMUNITY)
Admission: EM | Admit: 2014-02-19 | Discharge: 2014-02-19 | Disposition: A | Discharge status: Home / Self Care | Payer: Medicaid Other | Attending: Emergency Medicine | Admitting: Emergency Medicine

## 2014-02-19 ENCOUNTER — Emergency Department (HOSPITAL_COMMUNITY): Payer: Medicaid Other

## 2014-02-19 ENCOUNTER — Encounter (HOSPITAL_COMMUNITY): Payer: Self-pay | Admitting: Emergency Medicine

## 2014-02-19 DIAGNOSIS — Z87891 Personal history of nicotine dependence: Secondary | ICD-10-CM | POA: Diagnosis not present

## 2014-02-19 DIAGNOSIS — I10 Essential (primary) hypertension: Secondary | ICD-10-CM | POA: Diagnosis not present

## 2014-02-19 DIAGNOSIS — F319 Bipolar disorder, unspecified: Secondary | ICD-10-CM | POA: Insufficient documentation

## 2014-02-19 DIAGNOSIS — Z8739 Personal history of other diseases of the musculoskeletal system and connective tissue: Secondary | ICD-10-CM | POA: Insufficient documentation

## 2014-02-19 DIAGNOSIS — Z79899 Other long term (current) drug therapy: Secondary | ICD-10-CM | POA: Diagnosis not present

## 2014-02-19 DIAGNOSIS — Z7982 Long term (current) use of aspirin: Secondary | ICD-10-CM | POA: Insufficient documentation

## 2014-02-19 DIAGNOSIS — E119 Type 2 diabetes mellitus without complications: Secondary | ICD-10-CM | POA: Diagnosis not present

## 2014-02-19 DIAGNOSIS — R079 Chest pain, unspecified: Secondary | ICD-10-CM | POA: Diagnosis present

## 2014-02-19 DIAGNOSIS — Z9889 Other specified postprocedural states: Secondary | ICD-10-CM | POA: Diagnosis not present

## 2014-02-19 DIAGNOSIS — J40 Bronchitis, not specified as acute or chronic: Secondary | ICD-10-CM | POA: Diagnosis not present

## 2014-02-19 LAB — BASIC METABOLIC PANEL
Anion gap: 13 (ref 5–15)
BUN: 11 mg/dL (ref 6–23)
CO2: 20 mEq/L (ref 19–32)
CREATININE: 1 mg/dL (ref 0.50–1.35)
Calcium: 9.3 mg/dL (ref 8.4–10.5)
Chloride: 100 mEq/L (ref 96–112)
GFR calc Af Amer: >90 mL/min (ref >90)
GFR, EST NON AFRICAN AMERICAN: 85 mL/min — AB (ref >90)
GLUCOSE: 257 mg/dL — AB (ref 70–99)
POTASSIUM: 4.1 meq/L (ref 3.7–5.3)
Sodium: 133 mEq/L — ABNORMAL LOW (ref 137–147)

## 2014-02-19 LAB — CBC
HEMATOCRIT: 45.6 % (ref 39.0–52.0)
HEMOGLOBIN: 16 g/dL (ref 13.0–17.0)
MCH: 31.1 pg (ref 26.0–34.0)
MCHC: 35.1 g/dL (ref 30.0–36.0)
MCV: 88.7 fL (ref 78.0–100.0)
Platelets: 247 10*3/uL (ref 150–400)
RBC: 5.14 MIL/uL (ref 4.22–5.81)
RDW: 12.7 % (ref 11.5–15.5)
WBC: 4.5 10*3/uL (ref 4.0–10.5)

## 2014-02-19 LAB — I-STAT TROPONIN, ED: TROPONIN I, POC: 0.01 ng/mL (ref 0.00–0.08)

## 2014-02-19 LAB — PRO B NATRIURETIC PEPTIDE: Pro B Natriuretic peptide (BNP): 17.6 pg/mL (ref 0–125)

## 2014-02-19 MED ORDER — ALBUTEROL SULFATE (2.5 MG/3ML) 0.083% IN NEBU
5.0000 mg | INHALATION_SOLUTION | Freq: Once | RESPIRATORY_TRACT | Status: AC
  Administered 2014-02-19: 5 mg via RESPIRATORY_TRACT
  Filled 2014-02-19: qty 6

## 2014-02-19 MED ORDER — PREDNISONE 20 MG PO TABS
60.0000 mg | ORAL_TABLET | ORAL | Status: AC
  Administered 2014-02-19: 60 mg via ORAL
  Filled 2014-02-19: qty 3

## 2014-02-19 MED ORDER — TRAMADOL HCL 50 MG PO TABS: 50.0000 mg | ORAL_TABLET | Freq: Four times a day (QID) | ORAL | Status: DC | PRN

## 2014-02-19 MED ORDER — ALBUTEROL SULFATE HFA 108 (90 BASE) MCG/ACT IN AERS
2.0000 | INHALATION_SPRAY | Freq: Once | RESPIRATORY_TRACT | Status: AC
  Administered 2014-02-19: 2 via RESPIRATORY_TRACT
  Filled 2014-02-19: qty 6.7

## 2014-02-19 MED ORDER — PREDNISONE 20 MG PO TABS: 60.0000 mg | ORAL_TABLET | Freq: Every day | ORAL | Status: AC

## 2014-02-19 NOTE — ED Provider Notes (Signed)
CSN: [id number]     Arrival date & time 02/19/14  1225 History   First MD Initiated Contact with Patient 02/19/14 1248     Chief Complaint  Patient presents with  . Shortness of Breath  . Chest Pain  . Cough     HPI  Patient presents with chest discomfort, rhinorrhea, cough, dyspnea associated with coughing. There is minimal discomfort it is not associated with cough. No objective fever. No relief with OTC medication. There was initially vomiting, though this seems posttussive. Patient smokes. I discussed smoking cessation with the patient. Patient has a family history notable for cardiac disease, we discussed this at length.   Past Medical History  Diagnosis Date  . Hypertension   . Depression   . Diabetes mellitus without complication   . S/P cardiac catheterization, 09/14/12 with mild non obstructive CAD and EF of 55% 09/15/2012  . DJD (degenerative joint disease) of cervical spine     on disability  . Bipolar disorder    Past Surgical History  Procedure Laterality Date  . Back surgery     Family History  Problem Relation Age of Onset  . Heart disease Brother     has had heart transplant  . Diabetes Mother   . Cancer Mother   . Hypertension Mother   . Cancer Brother   . Diabetes Brother   . Cancer Brother    History  Substance Use Topics  . Smoking status: Former Smoker -- 0.25 packs/day for 20 years    Types: Cigarettes    Quit date: 10/11/2012  . Smokeless tobacco: Never Used  . Alcohol Use: 7.8 oz/week    3 Shots of liquor, 10 Cans of beer per week     Comment: 1x week    Review of Systems  Constitutional:       Per HPI, otherwise negative  HENT:       Per HPI, otherwise negative  Respiratory:       Per HPI, otherwise negative  Cardiovascular:       Per HPI, otherwise negative  Gastrointestinal: Negative for vomiting.  Endocrine:       Negative aside from HPI  Genitourinary:       Neg aside from HPI   Musculoskeletal:       Per HPI,  otherwise negative  Skin: Negative.   Neurological: Negative for syncope.      Allergies  Citrus  Home Medications   Prior to Admission medications   Medication Sig Start Date End Date Taking? Authorizing Provider  aspirin 81 MG tablet Take 1 tablet (81 mg total) by mouth daily. For heart health 11/09/12   Sanjuana KavaAgnes I Nwoko, NP  atorvastatin (LIPITOR) 40 MG tablet Take 1 tablet (40 mg total) by mouth daily at 6 PM. For high cholesterol control 11/09/12   Sanjuana KavaAgnes I Nwoko, NP  benztropine (COGENTIN) 0.5 MG tablet Take 1 tablet (0.5 mg total) by mouth 2 (two) times daily as needed. For prevention involuntary movement associated with Haldol 11/09/12   Sanjuana KavaAgnes I Nwoko, NP  escitalopram (LEXAPRO) 20 MG tablet Take 1 tablet (20 mg total) by mouth daily. For depression 11/09/12   Sanjuana KavaAgnes I Nwoko, NP  ezetimibe (ZETIA) 10 MG tablet Take 1 tablet (10 mg total) by mouth daily. 04/14/13   Mihai Croitoru, MD  glipiZIDE (GLUCOTROL) 5 MG tablet Take 1 tablet (5 mg total) by mouth 2 (two) times daily before a meal. For high blood sugar control 11/09/12   Sanjuana KavaAgnes I Nwoko, NP  lisinopril (PRINIVIL,ZESTRIL) 40 MG tablet Take 40 mg by mouth daily.    Historical Provider, MD  metFORMIN (GLUCOPHAGE) 1000 MG tablet Take 1 tablet (1,000 mg total) by mouth 2 (two) times daily with a meal. For high blood sugar control 01/12/13   Quentin Angstlugbemiga E Jegede, MD  oxyCODONE-acetaminophen (PERCOCET) 10-325 MG per tablet Take 1 tablet by mouth every 8 (eight) hours as needed for pain.    Historical Provider, MD   BP 157/92  Temp(Src) 97.8 F (36.6 C) (Oral)  Resp 16  SpO2 100% Physical Exam  Nursing note and vitals reviewed. Constitutional: He is oriented to person, place, and time. He appears well-developed. No distress.  HENT:  Head: Normocephalic and atraumatic.  Eyes: Conjunctivae and EOM are normal.  Cardiovascular: Normal rate and regular rhythm.   Pulmonary/Chest: Effort normal. No stridor. No respiratory distress.  Course breath  sounds bilaterally  Abdominal: He exhibits no distension.  Musculoskeletal: He exhibits no edema.  Neurological: He is alert and oriented to person, place, and time.  Skin: Skin is warm and dry.  Psychiatric: He has a normal mood and affect.    ED Course  Procedures (including critical care time) Labs Review Labs Reviewed  CBC  BASIC METABOLIC PANEL  PRO B NATRIURETIC PEPTIDE  I-STAT TROPOININ, ED    Imaging Review Dg Chest Port 1 View  02/19/2014   CLINICAL DATA:  Chest pain, shortness of breath and cough.  EXAM: PORTABLE CHEST - 1 VIEW  COMPARISON:  PA and lateral chest 09/12/2012.  FINDINGS: The lungs are clear. Heart size is normal. No pneumothorax or pleural effusion. No focal bony abnormality.  IMPRESSION: No acute disease.   Electronically Signed   By: Drusilla Kannerhomas  Dalessio M.D.   On: 02/19/2014 13:31     EKG Interpretation   Date/Time:  Sunday February 19 2014 12:36:08 EDT Ventricular Rate:  74 PR Interval:  141 QRS Duration: 116 QT Interval:  409 QTC Calculation: 454 R Axis:   -34 Text Interpretation:  Sinus rhythm Incomplete left bundle branch block Low  voltage, precordial leads Minimal ST elevation, anterior leads Baseline  wander in lead(s) II III aVF V3 V4 V5 V6 Sinus rhythm T wave abnormality  Artifact Borderline ECG Confirmed by Gerhard MunchLOCKWOOD, Luann Aspinwall  MD [phone]([id number]) on  02/19/2014 12:49:40 PM     On repeat exam the patient is in no distress MDM  Patient presents with several days of ongoing cough, congestion, pain associated with coughing, no substantial chest pain, nor any abdominal pain. Patient's labs are reassuring.  Patient has improvement with albuterol therapy.  Patient was discharged in stable condition with steroids, bronchodilator for presumed bronchitis.    Gerhard Munchobert Belynda Pagaduan, MD 02/19/14 [phone]

## 2014-02-19 NOTE — ED Notes (Signed)
Chest pain with cough only.

## 2014-02-19 NOTE — ED Notes (Signed)
MD at bedside. 

## 2014-02-19 NOTE — ED Notes (Signed)
Pt from home c/o centralized chest pain,shortness of breath and a cough since Friday.He reports a few episodes of vomiting. Productive yellow cough. Painful when he takes deep breaths.

## 2014-02-19 NOTE — Discharge Instructions (Signed)
As discussed, your condition is likely due to bronchitis.  Take all medication as directed, and to follow up with your physician.  Return here for concerning changes.  Please use the provided inhaler every four hours for the next two days.  The single most important thing you can do is to stop smoking cigarettes.

## 2014-04-13 ENCOUNTER — Encounter (HOSPITAL_COMMUNITY): Payer: Self-pay | Admitting: Cardiovascular Disease

## 2014-06-09 ENCOUNTER — Emergency Department (EMERGENCY_DEPARTMENT_HOSPITAL)
Admission: EM | Admit: 2014-06-09 | Discharge: 2014-06-10 | Disposition: A | Discharge status: Home / Self Care | Payer: Medicaid Other | Source: Home / Self Care | Attending: Emergency Medicine | Admitting: Emergency Medicine

## 2014-06-09 ENCOUNTER — Encounter (HOSPITAL_COMMUNITY): Payer: Self-pay | Admitting: Emergency Medicine

## 2014-06-09 DIAGNOSIS — E119 Type 2 diabetes mellitus without complications: Secondary | ICD-10-CM | POA: Diagnosis present

## 2014-06-09 DIAGNOSIS — I10 Essential (primary) hypertension: Secondary | ICD-10-CM | POA: Insufficient documentation

## 2014-06-09 DIAGNOSIS — F1410 Cocaine abuse, uncomplicated: Secondary | ICD-10-CM | POA: Insufficient documentation

## 2014-06-09 DIAGNOSIS — F329 Major depressive disorder, single episode, unspecified: Secondary | ICD-10-CM

## 2014-06-09 DIAGNOSIS — Z789 Other specified health status: Secondary | ICD-10-CM

## 2014-06-09 DIAGNOSIS — Z9889 Other specified postprocedural states: Secondary | ICD-10-CM

## 2014-06-09 DIAGNOSIS — Z791 Long term (current) use of non-steroidal anti-inflammatories (NSAID): Secondary | ICD-10-CM

## 2014-06-09 DIAGNOSIS — F1099 Alcohol use, unspecified with unspecified alcohol-induced disorder: Secondary | ICD-10-CM | POA: Insufficient documentation

## 2014-06-09 DIAGNOSIS — F17210 Nicotine dependence, cigarettes, uncomplicated: Secondary | ICD-10-CM | POA: Diagnosis present

## 2014-06-09 DIAGNOSIS — Z7982 Long term (current) use of aspirin: Secondary | ICD-10-CM

## 2014-06-09 DIAGNOSIS — F319 Bipolar disorder, unspecified: Secondary | ICD-10-CM | POA: Diagnosis present

## 2014-06-09 DIAGNOSIS — Z79899 Other long term (current) drug therapy: Secondary | ICD-10-CM | POA: Insufficient documentation

## 2014-06-09 DIAGNOSIS — Z720 Tobacco use: Secondary | ICD-10-CM

## 2014-06-09 DIAGNOSIS — F314 Bipolar disorder, current episode depressed, severe, without psychotic features: Principal | ICD-10-CM | POA: Diagnosis present

## 2014-06-09 DIAGNOSIS — E785 Hyperlipidemia, unspecified: Secondary | ICD-10-CM | POA: Diagnosis present

## 2014-06-09 DIAGNOSIS — F32A Depression, unspecified: Secondary | ICD-10-CM

## 2014-06-09 DIAGNOSIS — Z8739 Personal history of other diseases of the musculoskeletal system and connective tissue: Secondary | ICD-10-CM

## 2014-06-09 DIAGNOSIS — Z7289 Other problems related to lifestyle: Secondary | ICD-10-CM

## 2014-06-09 LAB — URINALYSIS, ROUTINE W REFLEX MICROSCOPIC
BILIRUBIN URINE: NEGATIVE
GLUCOSE, UA: NEGATIVE mg/dL
Hgb urine dipstick: NEGATIVE
Ketones, ur: NEGATIVE mg/dL
Leukocytes, UA: NEGATIVE
NITRITE: NEGATIVE
Protein, ur: NEGATIVE mg/dL
SPECIFIC GRAVITY, URINE: 1.022 (ref 1.005–1.030)
Urobilinogen, UA: 1 mg/dL (ref 0.0–1.0)
pH: 6.5 (ref 5.0–8.0)

## 2014-06-09 LAB — RAPID URINE DRUG SCREEN, HOSP PERFORMED
Amphetamines: NOT DETECTED
BARBITURATES: NOT DETECTED
BENZODIAZEPINES: NOT DETECTED
COCAINE: POSITIVE — AB
Opiates: NOT DETECTED
Tetrahydrocannabinol: NOT DETECTED

## 2014-06-09 LAB — ETHANOL

## 2014-06-09 LAB — CBG MONITORING, ED: Glucose-Capillary: 202 mg/dL — ABNORMAL HIGH (ref 70–99)

## 2014-06-09 MED ORDER — METFORMIN HCL 500 MG PO TABS
1000.0000 mg | ORAL_TABLET | Freq: Once | ORAL | Status: AC
  Administered 2014-06-09: 1000 mg via ORAL
  Filled 2014-06-09: qty 2

## 2014-06-09 MED ORDER — METFORMIN HCL 500 MG PO TABS
1000.0000 mg | ORAL_TABLET | Freq: Two times a day (BID) | ORAL | Status: DC
  Administered 2014-06-10: 1000 mg via ORAL
  Filled 2014-06-09 (×3): qty 2

## 2014-06-09 MED ORDER — ASPIRIN EC 81 MG PO TBEC
81.0000 mg | DELAYED_RELEASE_TABLET | Freq: Every day | ORAL | Status: DC
  Administered 2014-06-09 – 2014-06-10 (×2): 81 mg via ORAL
  Filled 2014-06-09 (×2): qty 1

## 2014-06-09 MED ORDER — ESCITALOPRAM OXALATE 10 MG PO TABS
20.0000 mg | ORAL_TABLET | Freq: Every day | ORAL | Status: DC
  Administered 2014-06-09 – 2014-06-10 (×2): 20 mg via ORAL
  Filled 2014-06-09 (×2): qty 2

## 2014-06-09 MED ORDER — LISINOPRIL 40 MG PO TABS
40.0000 mg | ORAL_TABLET | Freq: Every day | ORAL | Status: DC
  Administered 2014-06-09 – 2014-06-10 (×2): 40 mg via ORAL
  Filled 2014-06-09 (×2): qty 1

## 2014-06-09 MED ORDER — ACETAMINOPHEN 500 MG PO TABS
1000.0000 mg | ORAL_TABLET | Freq: Once | ORAL | Status: AC
  Administered 2014-06-09: 1000 mg via ORAL
  Filled 2014-06-09: qty 2

## 2014-06-09 MED ORDER — ALUM & MAG HYDROXIDE-SIMETH 200-200-20 MG/5ML PO SUSP: 30.0000 mL | ORAL | Status: DC | PRN

## 2014-06-09 MED ORDER — ACETAMINOPHEN 325 MG PO TABS: 650.0000 mg | ORAL_TABLET | ORAL | Status: DC | PRN

## 2014-06-09 MED ORDER — ONDANSETRON HCL 4 MG PO TABS: 4.0000 mg | ORAL_TABLET | Freq: Three times a day (TID) | ORAL | Status: DC | PRN

## 2014-06-09 MED ORDER — GLIPIZIDE 5 MG PO TABS: 5.0000 mg | ORAL_TABLET | Freq: Two times a day (BID) | ORAL | Status: DC

## 2014-06-09 NOTE — BH Assessment (Addendum)
Assessment Note  Eduardo GasserLeslie L Weppler is an 54 y.o. male who came to the Emergency Department voluntarily with complaints of worsening depression. He states that he ran out of his Lexapro about 2 weeks ago and does not have another appointment with his doctor until the 22nd. He goes [facility] to get his medication ref[facility]. He stated that he has been feeling more depressed since he lost his brother and his mom last year. He endorses some passive suicidal ideations "I just don't want to be here anymore"and feels a "lack of joy".  He also endorses passive HI thoughts to hurt people in his building and "members of his family". He did not endorse a specific plan and did not disclose who he would like to hurt. He stated he came to the hospital to get help so he did not act on any of these thoughts.  He says that he has been drinking 1/5th of alcohol daily for the past 3 years and his last drink was 12p last night. He also admits to using cocaine but did not say how much he uses. UDS was positive for cocaine. He says that he drinks to "self-medicate". He was taken off of pain medications and he states that drinking "helps with the pain". He states that he has a lot of physical pain from surgeries he had in his neck and from a rotary cuff in his right arm.  Pt also endorses some signs of visual hallucinations and states that he sees "a flash" go by out of the corner of his eye. He also states that he feels paranoid at times and feels like someone is standing behind him.  Per Julieanne CottonJosephine NP recommended inpatient placement. [facility] currently considering him if appropriate.   Axis I: 296.53 Bipolar 1, depressed severe, 303.90 Alcohol Use Disorder, Severe Axis II: Deferred Axis III:  Past Medical History  Diagnosis Date  . Hypertension   . Depression   . Diabetes mellitus without complication   . S/P cardiac catheterization, 09/14/12 with mild non obstructive CAD and EF of 55% 09/15/2012  . DJD (degenerative joint  disease) of cervical spine     on disability  . Bipolar disorder    Axis IV: economic problems, housing problems, other psychosocial or environmental problems and problems with primary support group Axis V: 41-50 serious symptoms  Past Medical History:  Past Medical History  Diagnosis Date  . Hypertension   . Depression   . Diabetes mellitus without complication   . S/P cardiac catheterization, 09/14/12 with mild non obstructive CAD and EF of 55% 09/15/2012  . DJD (degenerative joint disease) of cervical spine     on disability  . Bipolar disorder     Past Surgical History  Procedure Laterality Date  . Back surgery    . Left heart catheterization with coronary angiogram N/A 09/14/2012    Procedure: LEFT HEART CATHETERIZATION WITH CORONARY ANGIOGRAM;  Surgeon: Thurmon FairMihai Croitoru, MD;  Location: [facility] CATH LAB;  Service: Cardiovascular;  Laterality: N/A;    Family History:  Family History  Problem Relation Age of Onset  . Heart disease Brother     has had heart transplant  . Diabetes Mother   . Cancer Mother   . Hypertension Mother   . Cancer Brother   . Diabetes Brother   . Cancer Brother     Social History:  reports that he has been smoking Cigarettes.  He has a 6.25 pack-year smoking history. He has never used smokeless tobacco. He reports  that he drinks about 1.2 oz of alcohol per week. He reports that he does not use illicit drugs.  Additional Social History:  Alcohol / Drug Use History of alcohol / drug use?: Yes Longest period of sobriety (when/how long): has been sober for long periods of time in the past, started drinking again about 3 years ago Negative Consequences of Use: Personal relationships, Legal, Financial Withdrawal Symptoms: Agitation Substance #1 Name of Substance 1: Alcohol 1 - Age of First Use: 10 1 - Amount (size/oz): 1/5th 1 - Frequency: Daily 1 - Duration: All day 1 - Last Use / Amount: 12p last night  Substance #2 Name of Substance 2: Cocaine 2 -  Age of First Use: UTA 2 - Amount (size/oz): UTA 2 - Frequency: UTA 2 - Duration: UTA 2 - Last Use / Amount: + for cocaine Substance #3 Name of Substance 3: Marijuana 3 - Age of First Use: UTA 3 - Amount (size/oz): UTA 3 - Frequency: UTA 3 - Duration: UTA 3 - Last Use / Amount: + for Marijuana  CIWA: CIWA-Ar BP: 152/89 mmHg Pulse Rate: 95 COWS:    Allergies:  Allergies  Allergen Reactions  . Citrus Rash    Home Medications:  (Not in a hospital admission)  OB/GYN Status:  No LMP for male patient.  General Assessment Data Location of Assessment: [facility] ED ACT Assessment: Yes Is this a Tele or Face-to-Face Assessment?: Face-to-Face Is this an Initial Assessment or a Re-assessment for this encounter?: Initial Assessment Living Arrangements: Alone Can pt return to current living arrangement?: Yes Admission Status: Voluntary Is patient capable of signing voluntary admission?: Yes Transfer from: Home Referral Source: Self/Family/Friend     [facility] Crisis Care Plan Living Arrangements: Alone Name of Psychiatrist:  Marco Collie) Name of Therapist:  (None)  Education Status Is patient currently in school?: No Highest grade of school patient has completed: 10th  Risk to self with the past 6 months Suicidal Ideation: Yes-Currently Present Suicidal Intent: No Is patient at risk for suicide?: Yes Suicidal Plan?: No Access to Means:  (drugs and alcohol ) What has been your use of drugs/alcohol within the last 12 months?: + Cocaine and alcohol Previous Attempts/Gestures: No How many times?: 0 Other Self Harm Risks: Substance abuse Triggers for Past Attempts: Unknown Intentional Self Injurious Behavior: None Family Suicide History: No Recent stressful life event(s): Divorce, Loss (Comment) (divorced 3 years ago, lost mom and brother last year) Persecutory voices/beliefs?: No Depression: Yes Depression Symptoms: Despondent, Isolating, Loss of interest in usual  pleasures, Feeling worthless/self pity Substance abuse history and/or treatment for substance abuse?: Yes (no treatment history known) Suicide prevention information given to non-admitted patients: Not applicable  Risk to Others within the past 6 months Homicidal Ideation: Yes-Currently Present Thoughts of Harm to Others: Yes-Currently Present Comment - Thoughts of Harm to Others:  (thoughts to hurt people in his building as well as some fami) Current Homicidal Intent: No Current Homicidal Plan: No Access to Homicidal Means: No Identified Victim:  (people from his building, family members) History of harm to others?: Yes Assessment of Violence: In distant past Violent Behavior Description:  (was imprisoned for "assaulting a Emergency planning/management officer"-) Does patient have access to weapons?: No Criminal Charges Pending?: No Does patient have a court date: No  Psychosis Hallucinations: Visual Delusions: None noted  Mental Status Report Appear/Hygiene: Disheveled Eye Contact: Good Motor Activity: Rigidity Speech: Unremarkable Level of Consciousness: Alert Mood: Depressed Affect: Appropriate to circumstance Anxiety Level: Minimal Thought Processes: Coherent  Judgement: Impaired Orientation: Person, Place, Time, Situation Obsessive Compulsive Thoughts/Behaviors: Unable to Assess  Cognitive Functioning Concentration: Decreased Memory: Recent Intact, Remote Intact IQ: Average Insight: Fair Impulse Control: Fair Appetite: Good Weight Loss: 0 Weight Gain: 0 Sleep: Unable to Assess Vegetative Symptoms: Staying in bed  ADLScreening [facility] Assessment Services) Patient's cognitive ability adequate to safely complete daily activities?: Yes Patient able to express need for assistance with ADLs?: Yes Independently performs ADLs?: Yes (appropriate for developmental age)  Prior Inpatient Therapy Prior Inpatient Therapy: No  Prior Outpatient Therapy Prior Outpatient Therapy: No  ADL  Screening (condition at time of admission) Patient's cognitive ability adequate to safely complete daily activities?: Yes Does the patient have difficulty seeing, even when wearing glasses/contacts?: No Does the patient have difficulty concentrating, remembering, or making decisions?: No Patient able to express need for assistance with ADLs?: Yes Does the patient have difficulty dressing or bathing?: No Independently performs ADLs?: Yes (appropriate for developmental age) Does the patient have difficulty walking or climbing stairs?: No Weakness of Legs: None Weakness of Arms/Hands: None  Home Assistive Devices/Equipment Home Assistive Devices/Equipment: None    Abuse/Neglect Assessment (Assessment to be complete while patient is alone) Physical Abuse: Denies Verbal Abuse: Denies Sexual Abuse: Denies Exploitation of patient/patient's resources: Denies Self-Neglect: Denies     Merchant navy officer (For Healthcare) Does patient have an advance directive?: No Would patient like information on creating an advanced directive?: No - patient declined information    Additional Information 1:1 In Past 12 Months?: No CIRT Risk: No Elopement Risk: No Does patient have medical clearance?: Yes     Disposition:  Disposition Initial Assessment Completed for this Encounter: Yes Disposition of Patient: Inpatient treatment program Type of inpatient treatment program: Adult  On Site Evaluation by:   Reviewed with Physician:    Kateri Plummer 06/09/2014 6:56 PM

## 2014-06-09 NOTE — ED Provider Notes (Signed)
CSN: [id number]     Arrival date & time 06/09/14  1540 History   First MD Initiated Contact with Patient 06/09/14 1704     Chief Complaint  Patient presents with  . Depression     (Consider location/radiation/quality/duration/timing/severity/associated sxs/prior Treatment) HPI The patient reports he has had increasing depression over the course of a year but now his past several weeks it is gotten significantly worse. The patient reports that a lot of this started around the death of his mother last year and then the death of his brother more recently. He reports that he has been drinking more since this happened and becoming more and more reclusive in his home. He reports sometimes he doesn't want to live but he does not have any specific plan to kill himself. The patient reports that he was trying to get seen by his counselor but he will not be able to be seen for about 2 weeks. He denies any recent other drugs of abuse. The patient ports his last alcohol consumption was at approximately midnight last night. He is not experiencing any tremors or hallucination. He reports he has problems with chronic pain and his physician is made referrals for him although he has not seen some of his referrals as of yet. He does not endorse any acute changes in his medical health. Past Medical History  Diagnosis Date  . Hypertension   . Depression   . Diabetes mellitus without complication   . S/P cardiac catheterization, 09/14/12 with mild non obstructive CAD and EF of 55% 09/15/2012  . DJD (degenerative joint disease) of cervical spine     on disability  . Bipolar disorder    Past Surgical History  Procedure Laterality Date  . Back surgery    . Left heart catheterization with coronary angiogram N/A 09/14/2012    Procedure: LEFT HEART CATHETERIZATION WITH CORONARY ANGIOGRAM;  Surgeon: Thurmon Fair, MD;  Location: [facility] CATH LAB;  Service: Cardiovascular;  Laterality: N/A;   Family History  Problem Relation  Age of Onset  . Heart disease Brother     has had heart transplant  . Diabetes Mother   . Cancer Mother   . Hypertension Mother   . Cancer Brother   . Diabetes Brother   . Cancer Brother    History  Substance Use Topics  . Smoking status: Current Every Day Smoker -- 0.25 packs/day for 25 years    Types: Cigarettes    Last Attempt to Quit: 10/11/2012  . Smokeless tobacco: Never Used  . Alcohol Use: 1.2 oz/week    2 Cans of beer per week    Review of Systems 10 Systems reviewed and are negative for acute change except as noted in the HPI.    Allergies  Citrus  Home Medications   Prior to Admission medications   Medication Sig Start Date End Date Taking? Authorizing Provider  acetaminophen (TYLENOL) 500 MG tablet Take 500 mg by mouth every 6 (six) hours as needed for mild pain or headache.   Yes Historical Provider, MD  aspirin 81 MG tablet Take 1 tablet (81 mg total) by mouth daily. For heart health 11/09/12  Yes Sanjuana Kava, NP  atorvastatin (LIPITOR) 40 MG tablet Take 40 mg by mouth daily.   Yes Historical Provider, MD  lisinopril (PRINIVIL,ZESTRIL) 40 MG tablet Take 40 mg by mouth daily.   Yes Historical Provider, MD  metFORMIN (GLUCOPHAGE) 1000 MG tablet Take 1 tablet (1,000 mg total) by mouth 2 (two) times daily  with a meal. For high blood sugar control 01/12/13  Yes Quentin Angstlugbemiga E Jegede, MD  naproxen sodium (ANAPROX) 220 MG tablet Take 220 mg by mouth 2 (two) times daily with a meal.   Yes Historical Provider, MD  escitalopram (LEXAPRO) 20 MG tablet Take 1 tablet (20 mg total) by mouth daily. For depression Patient not taking: Reported on 06/09/2014 11/09/12   Sanjuana KavaAgnes I Nwoko, NP  glipiZIDE (GLUCOTROL) 5 MG tablet Take 1 tablet (5 mg total) by mouth 2 (two) times daily before a meal. For high blood sugar control Patient not taking: Reported on 06/09/2014 11/09/12   Sanjuana KavaAgnes I Nwoko, NP  traMADol (ULTRAM) 50 MG tablet Take 1 tablet (50 mg total) by mouth every 6 (six) hours as  needed. Patient not taking: Reported on 06/09/2014 02/19/14   Gerhard Munchobert Lockwood, MD   BP 153/73 mmHg  Pulse 86  Temp(Src) 98.2 F (36.8 C) (Oral)  Resp 18  SpO2 100% Physical Exam  Constitutional: He is oriented to person, place, and time. He appears well-developed and well-nourished.  HENT:  Head: Normocephalic and atraumatic.  Eyes: EOM are normal. Pupils are equal, round, and reactive to light.  Neck: Neck supple.  Cardiovascular: Normal rate, regular rhythm, normal heart sounds and intact distal pulses.   Pulmonary/Chest: Effort normal and breath sounds normal.  Abdominal: Soft. Bowel sounds are normal. He exhibits no distension. There is no tenderness.  Musculoskeletal: Normal range of motion. He exhibits no edema.  Neurological: He is alert and oriented to person, place, and time. He has normal strength. Coordination normal. GCS eye subscore is 4. GCS verbal subscore is 5. GCS motor subscore is 6.  Skin: Skin is warm, dry and intact.  Psychiatric: He has a normal mood and affect.    ED Course  Procedures (including critical care time) Labs Review Labs Reviewed  URINALYSIS, ROUTINE W REFLEX MICROSCOPIC - Abnormal; Notable for the following:    APPearance CLOUDY (*)    All other components within normal limits  URINE RAPID DRUG SCREEN (HOSP PERFORMED) - Abnormal; Notable for the following:    Cocaine POSITIVE (*)    All other components within normal limits  CBG MONITORING, ED - Abnormal; Notable for the following:    Glucose-Capillary 202 (*)    All other components within normal limits  ETHANOL    Imaging Review No results found.   EKG Interpretation None      MDM   Final diagnoses:  Depression  Alcohol use   The patient presents with increasing depression. Triggers appear to be family loss and drug and alcohol abuse and dependency. At this point the patient does not appear to have any acute medical problems. He is a known diabetic and history of hypertension.  His blood sugars at 200 and blood pressures are controlled. At this time he is cleared for psychiatric treatment.    Arby BarretteMarcy Nayeliz Hipp, MD 06/09/14 2001

## 2014-06-09 NOTE — ED Notes (Addendum)
Pt c/o depression, states he has been under a lot of stress since his mom died this year. Denies suicidal ideation or thoughts of self-harm, denies hallucinations. Pt states his doctor is unable to see him in the near future and that he is also out of his medications Lexipro and Latuda. Pt states his hope is to be sent to behavioral health hospital.

## 2014-06-09 NOTE — BH Assessment (Signed)
Inpatient treatment is recommended. Dr. Donnald GarrePfeiffer was informed of the recommendation.

## 2014-06-09 NOTE — ED Notes (Signed)
Pt presents with complaint of depression and HI thoughts will not elaborate further.  Pt reports diagnosed with Chronic Depression, PTSD and Bipolar DO.  Pt reports he ran out of Lexapro and is scheduled [facility] at the end of February.  Feeling hopeless, admits to Cocaine, Marijuana and Alcohol abuse.  Pt reports he drinks a fifth or pint of liquor per day.  Denies feeling SI, pt is known Diabetic.  Pt calm & cooperative at present, no distress noted.  Will monitor for safety.

## 2014-06-10 ENCOUNTER — Encounter (HOSPITAL_COMMUNITY): Payer: Self-pay | Admitting: *Deleted

## 2014-06-10 ENCOUNTER — Inpatient Hospital Stay (HOSPITAL_COMMUNITY)
Admission: AD | Admit: 2014-06-10 | Discharge: 2014-06-16 | DRG: 885 | Disposition: A | Discharge status: Home / Self Care | Payer: Medicaid Other | Source: Intra-hospital | Attending: Psychiatry | Admitting: Psychiatry

## 2014-06-10 DIAGNOSIS — F319 Bipolar disorder, unspecified: Secondary | ICD-10-CM

## 2014-06-10 DIAGNOSIS — F1020 Alcohol dependence, uncomplicated: Secondary | ICD-10-CM | POA: Insufficient documentation

## 2014-06-10 DIAGNOSIS — I10 Essential (primary) hypertension: Secondary | ICD-10-CM | POA: Diagnosis present

## 2014-06-10 DIAGNOSIS — Z7982 Long term (current) use of aspirin: Secondary | ICD-10-CM | POA: Diagnosis not present

## 2014-06-10 DIAGNOSIS — E119 Type 2 diabetes mellitus without complications: Secondary | ICD-10-CM | POA: Diagnosis present

## 2014-06-10 DIAGNOSIS — Z79899 Other long term (current) drug therapy: Secondary | ICD-10-CM | POA: Diagnosis not present

## 2014-06-10 DIAGNOSIS — F1410 Cocaine abuse, uncomplicated: Secondary | ICD-10-CM | POA: Insufficient documentation

## 2014-06-10 DIAGNOSIS — F17210 Nicotine dependence, cigarettes, uncomplicated: Secondary | ICD-10-CM | POA: Diagnosis present

## 2014-06-10 DIAGNOSIS — E1165 Type 2 diabetes mellitus with hyperglycemia: Secondary | ICD-10-CM

## 2014-06-10 DIAGNOSIS — F314 Bipolar disorder, current episode depressed, severe, without psychotic features: Secondary | ICD-10-CM | POA: Diagnosis present

## 2014-06-10 DIAGNOSIS — E785 Hyperlipidemia, unspecified: Secondary | ICD-10-CM | POA: Diagnosis present

## 2014-06-10 LAB — GLUCOSE, CAPILLARY: Glucose-Capillary: 255 mg/dL — ABNORMAL HIGH (ref 70–99)

## 2014-06-10 MED ORDER — TRAZODONE HCL 50 MG PO TABS
50.0000 mg | ORAL_TABLET | Freq: Every day | ORAL | Status: DC
  Administered 2014-06-10 – 2014-06-15 (×6): 50 mg via ORAL
  Filled 2014-06-10 (×2): qty 1
  Filled 2014-06-10: qty 4
  Filled 2014-06-10 (×6): qty 1

## 2014-06-10 MED ORDER — ASPIRIN EC 81 MG PO TBEC
81.0000 mg | DELAYED_RELEASE_TABLET | Freq: Every day | ORAL | Status: DC
  Administered 2014-06-11 – 2014-06-16 (×6): 81 mg via ORAL
  Filled 2014-06-10 (×8): qty 1

## 2014-06-10 MED ORDER — ALUM & MAG HYDROXIDE-SIMETH 200-200-20 MG/5ML PO SUSP: 30.0000 mL | ORAL | Status: DC | PRN

## 2014-06-10 MED ORDER — ACETAMINOPHEN 325 MG PO TABS
650.0000 mg | ORAL_TABLET | Freq: Four times a day (QID) | ORAL | Status: DC | PRN
  Administered 2014-06-10 – 2014-06-16 (×10): 650 mg via ORAL
  Filled 2014-06-10 (×11): qty 2

## 2014-06-10 MED ORDER — METFORMIN HCL 500 MG PO TABS
1000.0000 mg | ORAL_TABLET | ORAL | Status: AC
  Administered 2014-06-10: 1000 mg via ORAL
  Filled 2014-06-10 (×2): qty 2

## 2014-06-10 MED ORDER — MAGNESIUM HYDROXIDE 400 MG/5ML PO SUSP: 30.0000 mL | Freq: Every day | ORAL | Status: DC | PRN

## 2014-06-10 MED ORDER — METFORMIN HCL 500 MG PO TABS
1000.0000 mg | ORAL_TABLET | Freq: Two times a day (BID) | ORAL | Status: DC
  Administered 2014-06-11 – 2014-06-16 (×11): 1000 mg via ORAL
  Filled 2014-06-10 (×2): qty 2
  Filled 2014-06-10: qty 16
  Filled 2014-06-10 (×7): qty 2
  Filled 2014-06-10: qty 16
  Filled 2014-06-10 (×5): qty 2

## 2014-06-10 MED ORDER — LISINOPRIL 40 MG PO TABS
40.0000 mg | ORAL_TABLET | Freq: Every day | ORAL | Status: DC
  Administered 2014-06-11 – 2014-06-16 (×6): 40 mg via ORAL
  Filled 2014-06-10 (×8): qty 1

## 2014-06-10 MED ORDER — ESCITALOPRAM OXALATE 20 MG PO TABS
20.0000 mg | ORAL_TABLET | Freq: Every day | ORAL | Status: DC
  Administered 2014-06-11 – 2014-06-16 (×6): 20 mg via ORAL
  Filled 2014-06-10 (×4): qty 1
  Filled 2014-06-10: qty 4
  Filled 2014-06-10 (×3): qty 1

## 2014-06-10 NOTE — Progress Notes (Signed)
Patient ID: Sabino GasserLeslie L Twaddle, male   DOB: [birth year], 54 y.o.   MRN: [id number] Pt is a 54 year old voluntary [facility]. He has allergies to : citrus fruit. Medical Hx includes: HTN, depression,DM, DJD, Bipolar, Back surgery, chronic right shoulder rotator cuff pain and left hip and right knee pain. Pt stated he worked for over 33 years and is now on Medicaid. He is frustrated as he feels because he is on medicaid he can not get the healthcare he needs to feel better and to be painfree. Pt stated he medicates with alcohol to get rid of his constant pain he has in his torn rotator cuff shoulder, knee pain , neck pain and hip pain. Pt also admits that he is very depressed secondary to his brother dying last June and his mom dying this past November. He does live alone and has very supportive children. Pt stated," I wokred a lot fo years and supported people on Medicaid now I need help and feel everybody looks down on me like I want a free ride." Pt does contract for safety. He is passive SI and stated,"it really does not matter to me if I live or dies cause I am always in so much pain. The doctor just thinks a RX for tramadol will work. I was told two years ago I would be sent to a pain clinic and that never happened."

## 2014-06-10 NOTE — BH Assessment (Signed)
Patient meets inpatient criteria per Dr. Tawni CarnesSaranga and Julieanne CottonJosephine, NP. Per Inetta Fermoina, [facility]1 to Dr. Jama Flavorsobos.  Clinician to update nursing staff.   Nira Retortelilah Clessie Karras, MSW, LCSW Triage Specialist [phone]
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
## 2014-06-10 NOTE — BH Assessment (Signed)
[facility] is currently at capacity. Contacted the following facilities for placement:  BED AVAILABLE, FA[facility]ED CLINICAL INFORMATION: [facility]   [address]Ethel Veronica Ellis [place] RanchWarrick Jr, [place]LPC, [facility] Triage Specialist [phone]

## 2014-06-10 NOTE — Progress Notes (Signed)
Did not attend group 

## 2014-06-11 ENCOUNTER — Encounter (HOSPITAL_COMMUNITY): Payer: Self-pay | Admitting: Registered Nurse

## 2014-06-11 DIAGNOSIS — F314 Bipolar disorder, current episode depressed, severe, without psychotic features: Principal | ICD-10-CM

## 2014-06-11 DIAGNOSIS — F1020 Alcohol dependence, uncomplicated: Secondary | ICD-10-CM | POA: Insufficient documentation

## 2014-06-11 DIAGNOSIS — F1410 Cocaine abuse, uncomplicated: Secondary | ICD-10-CM | POA: Insufficient documentation

## 2014-06-11 DIAGNOSIS — F1099 Alcohol use, unspecified with unspecified alcohol-induced disorder: Secondary | ICD-10-CM

## 2014-06-11 LAB — COMPREHENSIVE METABOLIC PANEL
ALBUMIN: 4.1 g/dL (ref 3.5–5.2)
ALT: 35 U/L (ref 0–53)
AST: 30 U/L (ref 0–37)
Alkaline Phosphatase: 70 U/L (ref 39–117)
Anion gap: 8 (ref 5–15)
BUN: 20 mg/dL (ref 6–23)
CHLORIDE: 103 mmol/L (ref 96–112)
CO2: 24 mmol/L (ref 19–32)
Calcium: 9.2 mg/dL (ref 8.4–10.5)
Creatinine, Ser: 1.4 mg/dL — ABNORMAL HIGH (ref 0.50–1.35)
GFR calc Af Amer: 65 mL/min — ABNORMAL LOW (ref >90)
GFR calc non Af Amer: 56 mL/min — ABNORMAL LOW (ref >90)
Glucose, Bld: 257 mg/dL — ABNORMAL HIGH (ref 70–99)
POTASSIUM: 4.2 mmol/L (ref 3.5–5.1)
Sodium: 135 mmol/L (ref 135–145)
Total Bilirubin: 0.7 mg/dL (ref 0.3–1.2)
Total Protein: 7.4 g/dL (ref 6.0–8.3)

## 2014-06-11 LAB — CBC WITH DIFFERENTIAL/PLATELET
BASOS PCT: 0 % (ref 0–1)
Basophils Absolute: 0 10*3/uL (ref 0.0–0.1)
EOS ABS: 0.1 10*3/uL (ref 0.0–0.7)
EOS PCT: 1 % (ref 0–5)
HEMATOCRIT: 43.6 % (ref 39.0–52.0)
HEMOGLOBIN: 15.3 g/dL (ref 13.0–17.0)
Lymphocytes Relative: 41 % (ref 12–46)
Lymphs Abs: 2.9 10*3/uL (ref 0.7–4.0)
MCH: 31.2 pg (ref 26.0–34.0)
MCHC: 35.1 g/dL (ref 30.0–36.0)
MCV: 88.8 fL (ref 78.0–100.0)
Monocytes Absolute: 0.5 10*3/uL (ref 0.1–1.0)
Monocytes Relative: 7 % (ref 3–12)
Neutro Abs: 3.6 10*3/uL (ref 1.7–7.7)
Neutrophils Relative %: 51 % (ref 43–77)
Platelets: 263 10*3/uL (ref 150–400)
RBC: 4.91 MIL/uL (ref 4.22–5.81)
RDW: 12.5 % (ref 11.5–15.5)
WBC: 7.1 10*3/uL (ref 4.0–10.5)

## 2014-06-11 LAB — LIPID PANEL
CHOL/HDL RATIO: 5.1 ratio
CHOLESTEROL: 229 mg/dL — AB (ref 0–200)
HDL: 45 mg/dL (ref >39)
LDL CALC: UNDETERMINED mg/dL (ref 0–99)
Triglycerides: 454 mg/dL — ABNORMAL HIGH (ref <150)
VLDL: UNDETERMINED mg/dL (ref 0–40)

## 2014-06-11 LAB — GLUCOSE, CAPILLARY
GLUCOSE-CAPILLARY: 220 mg/dL — AB (ref 70–99)
GLUCOSE-CAPILLARY: 197 mg/dL — AB (ref 70–99)

## 2014-06-11 MED ORDER — HALOPERIDOL 0.5 MG PO TABS
0.5000 mg | ORAL_TABLET | Freq: Two times a day (BID) | ORAL | Status: DC
  Administered 2014-06-11 – 2014-06-12 (×2): 0.5 mg via ORAL
  Filled 2014-06-11 (×4): qty 1

## 2014-06-11 MED ORDER — BENZTROPINE MESYLATE 0.5 MG PO TABS
0.5000 mg | ORAL_TABLET | Freq: Two times a day (BID) | ORAL | Status: DC
  Administered 2014-06-11 – 2014-06-16 (×10): 0.5 mg via ORAL
  Filled 2014-06-11: qty 1
  Filled 2014-06-11: qty 8
  Filled 2014-06-11 (×3): qty 1
  Filled 2014-06-11: qty 8
  Filled 2014-06-11 (×8): qty 1

## 2014-06-11 NOTE — Progress Notes (Signed)
Adult Psychoeducational Group Note  Date:  06/11/2014 Time:  3:20 PM  Group Topic/Focus:  Wrap-Up Group:   The focus of this group is to help patients review their daily goal of treatment and discuss progress on daily workbooks.  Participation Level:  Active  Participation Quality:  Appropriate and Attentive  Affect:  Appropriate  Cognitive:  Alert and Appropriate  Insight: Appropriate and Good  Engagement in Group:  Engaged  Modes of Intervention:  Activity  Additional Comments:  Pt was in group and was engaged in group discussions   Beretta Ginsberg R 06/11/2014, 3:20 PM
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
## 2014-06-11 NOTE — Progress Notes (Signed)
[facility] Group Notes:  (Nursing/MHT/Case Management/Adjunct)  Date:  06/11/2014  Time:  10:45  Type of Therapy:  Nurse Education  Participation Level:  Minimal  Participation Quality:  Inattentive  Affect:  Flat  Cognitive:  Appropriate  Insight:  None  Engagement in Group:  Poor  Modes of Intervention:  Discussion and Education  Summary of Progress/Problems:  Bennye Almrdley, Charyl Minervini Violon 06/11/2014, 2:30 PM

## 2014-06-11 NOTE — Progress Notes (Signed)
D: Pt denies SI/HI/AV. Pt is pleasant and cooperative. Pt rates depression at a 7, anxiety at a 5, and Helplessness/hopelessness at a 6.  A: Pt was offered support and encouragement. Pt was given scheduled medications. Pt was encourage to attend groups. Q 15 minute checks were done for safety.  R:Pt attends groups and interacts well with peers and staff. Pt taking medication. Pt has no complaints.Pt receptive to treatment and safety maintained on unit.
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
## 2014-06-11 NOTE — [facility] Group Notes (Signed)
[facility] Group Notes:  (Clinical Social Work)  06/11/2014  11:15AM-12:05PM  Summary of Progress/Problems:   The main focus of today's process group was to   1)  discuss the importance of adding supports  2)  define health supports versus unhealthy supports  3)  identify the patient's current unhealthy supports and plan how to handle them  4)  Identify the patient's current healthy supports and plan what to add.  An emphasis was placed on using counselor, doctor, therapy groups, 12-step groups, and problem-specific support groups to expand supports.    The patient expressed full comprehension of the concepts presented, and agreed that there is a need to add more supports.  The patient stated his current healthy supports are his 3 adult children, church members and pastor.  His unhealthy supports are himself, some friends and church members who are self-righteous, some family members.  He feels that he wants to develop more support in a faith relationship with God, as well as with himself.  He also wants to join [employer], start swimming, and paint.  Type of Therapy:  Process Group with Motivational Interviewing  Participation Level:  Active  Participation Quality:  Attentive and Sharing  Affect:  Blunted  Cognitive:  Appropriate  Insight:  Engaged  Engagement in Therapy:  Engaged  Modes of Intervention:   Education, Support and Processing, Activity  Pilgrim's PrideMareida Grossman-Orr, LCSW 06/11/2014, 12:15pm

## 2014-06-11 NOTE — Progress Notes (Signed)
Writer has observed the patient up in the dayroom watching the game. He c/o neck and right shoulder pain which he received tylenol. He reports that he normally take his pain medications but he can't get them here. Writer encouraged him to speak with his doctor in the morning about this. Safety maintained on unit with 15 min checks.

## 2014-06-12 DIAGNOSIS — F1410 Cocaine abuse, uncomplicated: Secondary | ICD-10-CM

## 2014-06-12 DIAGNOSIS — F1020 Alcohol dependence, uncomplicated: Secondary | ICD-10-CM

## 2014-06-12 LAB — GLUCOSE, CAPILLARY: GLUCOSE-CAPILLARY: 230 mg/dL — AB (ref 70–99)

## 2014-06-12 LAB — TSH: TSH: 3.302 u[IU]/mL (ref 0.350–4.500)

## 2014-06-12 MED ORDER — HALOPERIDOL 1 MG PO TABS
1.0000 mg | ORAL_TABLET | Freq: Two times a day (BID) | ORAL | Status: DC
  Administered 2014-06-12 – 2014-06-16 (×8): 1 mg via ORAL
  Filled 2014-06-12: qty 1
  Filled 2014-06-12 (×2): qty 8
  Filled 2014-06-12 (×9): qty 1

## 2014-06-12 NOTE — Progress Notes (Signed)
Pt resting in bed with eyes closed.  No distress observed.  Respirations even/unlabored.  Safety maintained with q15 minute checks. 

## 2014-06-12 NOTE — [facility] Group Notes (Signed)
[facility] LCSW Group Therapy          Overcoming Obstacles       1:15 -2:30        06/12/2014   2:42 PM     Type of Therapy:  Group Therapy  Participation Level:  Appropriate  Participation Quality:  Appropriate  Affect:  Appropriate, Alert  Cognitive:  Attentive Appropriate  Insight: Developing/Improving Engaged  Engagement in Therapy: Developing/Imprvoing Engaged  Modes of Intervention:  Discussion Exploration  Education Rapport BuildingProblem-Solving Support  Summary of Progress/Problems:  The main focus of today's group was overcoming obstacles. Patient shared the obstacle he has to overcome is pain.  He advised he is frustrated with doctors who make decisions about medications he take for pain.  Patient shared he has not been able to find a MD to refer him for pain management. Patient shared he drinks as a way of coping with pain.     Wynn BankerHodnett, Narcissa Melder Hairston 06/12/2014   2:42 PM

## 2014-06-12 NOTE — Progress Notes (Signed)
[facility] MD Progress Note  06/12/2014 11:03 AM Eduardo French  MRN:  [id number] Subjective:  Eduardo French endorses that he still hears voices. He states he has done better on Haldol than on Latuda. States that once he goes back to his psychiatrist outpatient he is going to request the change. States [name] makes him too sedated. He endorses he has gotten increasingly more depressed since he lost his brother and his mother. He is currently separated. States his drinking escalated after the depression got worst. Principal Problem: <principal problem not specified> Diagnosis:   Patient Active Problem List   Diagnosis Date Noted  . Cocaine use disorder, mild, abuse [F14.10]   . Alcohol use disorder, moderate, dependence [F10.20]   . Bipolar 1 disorder, depressed, severe [F31.4] 06/10/2014  . Obesity (BMI 30.0-34.9) [E66.9] 04/11/2013  . Alcohol dependence [F10.20] 11/06/2012  . Cocaine dependence [F14.20] 11/06/2012  . Paranoid schizophrenia, chronic condition [F20.0] 11/06/2012  . Alcohol abuse [F10.10] 11/05/2012  . S/P cardiac catheterization, 09/14/12 with mild non obstructive CAD and EF of 55% [Z98.89] 09/15/2012  . HTN (hypertension), benign [I10] 09/12/2012  . DM type 2 (diabetes mellitus, type 2) [E11.9] 09/12/2012  . BPH (benign prostatic hyperplasia) [N40.0] 09/12/2012  . Unstable angina, cardiac cath without obstruction [I20.0] 09/12/2012  . Smoker [Z72.0] 09/12/2012  . 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 Depression [F32.9] 09/12/2012  . Bipolar disorder [F31.9] 09/12/2012  . Dyslipidemia- (LDL 190 in 2011) [E78.5] 09/12/2012   Total Time spent with patient: 30 minutes   Past Medical History:  Past Medical History  Diagnosis Date  . Hypertension   . Depression   . Diabetes mellitus without complication   . S/P cardiac catheterization, 09/14/12 with mild non obstructive CAD and EF of 55% 09/15/2012  . DJD (degenerative joint disease) of cervical spine     on  disability  . Bipolar disorder     Past Surgical History  Procedure Laterality Date  . Back surgery    . Left heart catheterization with coronary angiogram N/A 09/14/2012    Procedure: LEFT HEART CATHETERIZATION WITH CORONARY ANGIOGRAM;  Surgeon: Sanda Klein, MD;  Location: [facility]ATH [facility];  Service: Cardiovascular;  Laterality: N/A;   Family History:  Family History  Problem Relation Age of Onset  . Heart disease Brother     has had heart transplant  . Diabetes Mother   . Cancer Mother   . Hypertension Mother   . Cancer Brother   . Diabetes Brother   . Cancer Brother    Social History:  History  Alcohol Use  . 1.2 oz/week  . 2 Cans of beer per week     History  Drug Use No    History   Social History  . Marital Status: Legally Separated    Spouse Name: N/A    Number of Children: N/A  . Years of Education: N/A   Social History Main Topics  . Smoking status: Current Every Day Smoker -- 0.25 packs/day for 25 years    Types: Cigarettes    Last Attempt to Quit: 10/11/2012  . Smokeless tobacco: Never Used  . Alcohol Use: 1.2 oz/week    2 Cans of beer per week  . Drug Use: No  . Sexual Activity: Yes   Other Topics Concern  . None   Social History Narrative   Additional History:    Sleep: Fair  Appetite:  Poor   Assessment:   Musculoskeletal: Strength & Muscle Tone: within normal  limits Gait & Station: normal Patient leans: N/A   Psychiatric Specialty Exam: Physical Exam  Review of Systems  Constitutional: Positive for malaise/fatigue.  Eyes: Negative.   Respiratory:       Pack every 3 days  Cardiovascular: Positive for chest pain.  Gastrointestinal: Positive for heartburn and diarrhea.  Genitourinary: Negative.   Musculoskeletal: Positive for neck pain.  Skin: Negative.   Neurological: Positive for dizziness, weakness and headaches.  Endo/Heme/Allergies: Negative.   Psychiatric/Behavioral: Positive for depression and hallucinations. The  patient is nervous/anxious.     Blood pressure 136/67, pulse 75, temperature 97.4 F (36.3 C), temperature source Oral, resp. rate 16, height _0  (1.753 m), weight 74.844 kg (165 lb), SpO2 100 %.Body mass index is 24.36 kg/(m^2).  General Appearance: Fairly Groomed  Engineer, water::  Fair  Speech:  Clear and Coherent and not spontaneous  Volume:  Decreased  Mood:  Anxious and Depressed  Affect:  Restricted  Thought Process:  Coherent and Goal Directed  Orientation:  Full (Time, Place, and Person)  Thought Content:  symtpoms events worries concerns  Suicidal Thoughts:  No  Homicidal Thoughts:  No  Memory:  Immediate;   Fair Recent;   Fair Remote;   Fair  Judgement:  Fair  Insight:  Present  Psychomotor Activity:  Restlessness  Concentration:  Fair  Recall:  [employer] of Knowledge:Fair  Language: Fair  Akathisia:  No  Handed:  Right  AIMS (if indicated):     Assets:  Desire for Improvement  ADL's:  Intact  Cognition: WNL  Sleep:  Number of Hours: 6.75     Current Medications: Current Facility-Administered Medications  Medication Dose Route Frequency Provider Last Rate Last Dose  . acetaminophen (TYLENOL) tablet 650 mg  650 mg Oral Q6H PRN Merian Capron, MD   650 mg at 06/11/14 2241  . alum & mag hydroxide-simeth (MAALOX/MYLANTA) 200-200-20 MG/5ML suspension 30 mL  30 mL Oral Q4H PRN Merian Capron, MD      . aspirin EC tablet 81 mg  81 mg Oral Daily Delfin Gant, NP   81 mg at 06/12/14 0805  . benztropine (COGENTIN) tablet 0.5 mg  0.5 mg Oral BID Shuvon Rankin, NP   0.5 mg at 06/12/14 0806  . escitalopram (LEXAPRO) tablet 20 mg  20 mg Oral Daily Delfin Gant, NP   20 mg at 06/12/14 0806  . haloperidol (HALDOL) tablet 0.5 mg  0.5 mg Oral BID Shuvon Rankin, NP   0.5 mg at 06/12/14 0806  . lisinopril (PRINIVIL,ZESTRIL) tablet 40 mg  40 mg Oral Daily Delfin Gant, NP   40 mg at 06/12/14 0805  . magnesium hydroxide (MILK OF MAGNESIA) suspension 30 mL  30 mL Oral  Daily PRN Merian Capron, MD      . metFORMIN (GLUCOPHAGE) tablet 1,000 mg  1,000 mg Oral BID WC Delfin Gant, NP   1,000 mg at 06/12/14 0806  . traZODone (DESYREL) tablet 50 mg  50 mg Oral QHS Merian Capron, MD   50 mg at 06/11/14 2238    Lab Results:  Results for orders placed or performed during the hospital encounter of 06/10/14 (from the past 48 hour(s))  Glucose, capillary     Status: Abnormal   Collection Time: 06/10/14  8:46 PM  Result Value Ref Range   Glucose-Capillary 255 (H) 70 - 99 mg/dL  Glucose, capillary     Status: Abnormal   Collection Time: 06/11/14  6:29 AM  Result Value Ref Range  Glucose-Capillary 220 (H) 70 - 99 mg/dL  Glucose, capillary     Status: Abnormal   Collection Time: 06/11/14  5:27 PM  Result Value Ref Range   Glucose-Capillary 197 (H) 70 - 99 mg/dL  TSH     Status: None   Collection Time: 06/11/14  7:22 PM  Result Value Ref Range   TSH 3.302 0.350 - 4.500 uIU/mL    Comment: Performed at [facility]  CBC with Differential/Platelet     Status: None   Collection Time: 06/11/14  7:22 PM  Result Value Ref Range   WBC 7.1 4.0 - 10.5 K/uL   RBC 4.91 4.22 - 5.81 MIL/uL   Hemoglobin 15.3 13.0 - 17.0 g/dL   HCT 43.6 39.0 - 52.0 %   MCV 88.8 78.0 - 100.0 fL   MCH 31.2 26.0 - 34.0 pg   MCHC 35.1 30.0 - 36.0 g/dL   RDW 12.5 11.5 - 15.5 %   Platelets 263 150 - 400 K/uL   Neutrophils Relative % 51 43 - 77 %   Neutro Abs 3.6 1.7 - 7.7 K/uL   Lymphocytes Relative 41 12 - 46 %   Lymphs Abs 2.9 0.7 - 4.0 K/uL   Monocytes Relative 7 3 - 12 %   Monocytes Absolute 0.5 0.1 - 1.0 K/uL   Eosinophils Relative 1 0 - 5 %   Eosinophils Absolute 0.1 0.0 - 0.7 K/uL   Basophils Relative 0 0 - 1 %   Basophils Absolute 0.0 0.0 - 0.1 K/uL    Comment: Performed at [facility]  Comprehensive metabolic panel     Status: Abnormal   Collection Time: 06/11/14  7:22 PM  Result Value Ref Range   Sodium 135 135 - 145 mmol/L   Potassium 4.2 3.5  - 5.1 mmol/L   Chloride 103 96 - 112 mmol/L   CO2 24 19 - 32 mmol/L   Glucose, Bld 257 (H) 70 - 99 mg/dL   BUN 20 6 - 23 mg/dL   Creatinine, Ser 1.40 (H) 0.50 - 1.35 mg/dL   Calcium 9.2 8.4 - 10.5 mg/dL   Total Protein 7.4 6.0 - 8.3 g/dL   Albumin 4.1 3.5 - 5.2 g/dL   AST 30 0 - 37 U/L   ALT 35 0 - 53 U/L   Alkaline Phosphatase 70 39 - 117 U/L   Total Bilirubin 0.7 0.3 - 1.2 mg/dL   GFR calc non Af Amer 56 (L) >90 mL/min   GFR calc Af Amer 65 (L) >90 mL/min    Comment: (NOTE) The eGFR has been calculated using the CKD EPI equation. This calculation has not been validated in all clinical situations. eGFR's persistently <90 mL/min signify possible Chronic Kidney Disease.    Anion gap 8 5 - 15    Comment: Performed at [facility]  Lipid panel     Status: Abnormal   Collection Time: 06/11/14  7:22 PM  Result Value Ref Range   Cholesterol 229 (H) 0 - 200 mg/dL   Triglycerides 454 (H) <150 mg/dL   HDL 45 >39 mg/dL   Total CHOL/HDL Ratio 5.1 RATIO   VLDL UNABLE TO CALCULATE IF TRIGLYCERIDE OVER 400 mg/dL 0 - 40 mg/dL   LDL Cholesterol UNABLE TO CALCULATE IF TRIGLYCERIDE OVER 400 mg/dL 0 - 99 mg/dL    Comment:        Total Cholesterol/HDL:CHD Risk Coronary Heart Disease Risk Table  Men   Women  1/2 Average Risk   3.4   3.3  Average Risk       5.0   4.4  2 X Average Risk   9.6   7.1  3 X Average Risk  23.4   11.0        Use the calculated Patient Ratio above and the CHD Risk Table to determine the patient's CHD Risk.        ATP III CLASSIFICATION (LDL):  <100     mg/dL   Optimal  100-129  mg/dL   Near or Above                    Optimal  130-159  mg/dL   Borderline  160-189  mg/dL   High  >190     mg/dL   Very High Performed at [facility]   Glucose, capillary     Status: Abnormal   Collection Time: 06/12/14  6:29 AM  Result Value Ref Range   Glucose-Capillary 230 (H) 70 - 99 mg/dL    Physical Findings: AIMS:  , ,  ,  ,     CIWA:    COWS:     Treatment Plan Summary: Daily contact with patient to assess and evaluate symptoms and progress in treatment and Medication management Supportive approach/coping skills Alcohol dependence: address detox needs, assess and address cravings, work a relapse prevention plan Depression: reassess the effectiveness of Lexapro 20 mg, work on CBT, mindfulness Auditory hallucinations: optimize response to Haldol (increase Haldol to 1 mg BID) Explore residential treatment options  Medical Decision Making:  Review of Psycho-Social Stressors (1), Review or order clinical lab tests (1), Review of Medication Regimen & Side Effects (2) and Review of New Medication or Change in Dosage (2)     Kashmere Staffa A 06/12/2014, 11:03 AM

## 2014-06-12 NOTE — Tx Team (Signed)
Initial Interdisciplinary Treatment Plan   PATIENT STRESSORS: Financial difficulties Health problems Medication change or noncompliance Substance abuse   PATIENT STRENGTHS: Average or above average intelligence Capable of independent living General fund of knowledge Motivation for treatment/growth   PROBLEM LIST: Problem List/Patient Goals Date to be addressed Date deferred Reason deferred Estimated date of resolution  Substance abuse(cocaine, alcohol)      Chronic pain d/t past surgeries.      Health issues            "I need to get back on my medications"      " I need help dealing with my depression"                         DISCHARGE CRITERIA:  Ability to meet basic life and health needs Adequate post-discharge living arrangements Improved stabilization in mood, thinking, and/or behavior Motivation to continue treatment in a less acute level of care Need for constant or close observation no longer present Verbal commitment to aftercare and medication compliance Withdrawal symptoms are absent or subacute and managed without 24-hour nursing intervention  PRELIMINARY DISCHARGE PLAN: Attend aftercare/continuing care group Attend 12-step recovery group Outpatient therapy Return to previous living arrangement  PATIENT/FAMIILY INVOLVEMENT: This treatment plan has been presented to and reviewed with the patient, Eduardo GasserLeslie L Beagley, and/or family member.  The patient and family have been given the opportunity to ask questions and make suggestions.  Jesus GeneraSpeagle, Mikyah [facility] 06/12/2014, 6:41 AM

## 2014-06-12 NOTE — Progress Notes (Signed)
D: Pt denies SI/HI but is still hearing voices that sound like they are laughing at him. Pt is pleasant and cooperative. Pt rates depression at a 7, anxiety at a 3, and Helplessness/hopelessness at a 5.  A: Pt was offered support and encouragement. Pt was given scheduled medications. Pt was encourage to attend groups. Q 15 minute checks were done for safety.  R:Pt attends groups and interacts well with peers and staff. Pt taking medication. Pt has no complaints.Pt receptive to treatment and safety maintained on unit.

## 2014-06-12 NOTE — [facility] Group Notes (Signed)
[facility] Note   06/12/2014 12:00 PM    Participation Quality:  Appropraite  Mood/Affect:  Appropriate  Depression Rating:  6  Anxiety Rating:  6  Thoughts of Suicide:  No  Will you contract for safety?   NA  Current AVH:  No  Plan for Discharge/Comments:  Patient attended discharge planning group and actively participated in group. Patient advised of not being interested in residential treatment but plans to continue following with [facility].  Suicide prevention education reviewed and SPE document provided.   Transportation Means: Patient has transportation.   Supports:  Patient has a support system.   Eduardo French, Eduardo French

## 2014-06-12 NOTE — [facility] Suicide Risk Assessment (Signed)
[facility] INPATIENT:  Family/Significant Other Suicide Prevention Education  Suicide Prevention Education:  Patient Refusal for Family/Significant Other Suicide Prevention Education: The patient Eduardo French has refused to provide written consent for family/significant other to be provided Family/Significant Other Suicide Prevention Education during admission and/or prior to discharge.  Physician notified.  Wynn BankerHodnett, Darel Ricketts Hairston 06/12/2014, 2:41 PM

## 2014-06-13 LAB — HEMOGLOBIN A1C
Hgb A1c MFr Bld: 9.7 % — ABNORMAL HIGH (ref 4.8–5.6)
MEAN PLASMA GLUCOSE: 232 mg/dL

## 2014-06-13 LAB — GLUCOSE, CAPILLARY
GLUCOSE-CAPILLARY: 259 mg/dL — AB (ref 70–99)
GLUCOSE-CAPILLARY: 230 mg/dL — AB (ref 70–99)
GLUCOSE-CAPILLARY: 229 mg/dL — AB (ref 70–99)
GLUCOSE-CAPILLARY: 180 mg/dL — AB (ref 70–99)
GLUCOSE-CAPILLARY: 222 mg/dL — AB (ref 70–99)
Glucose-Capillary: 229 mg/dL — ABNORMAL HIGH (ref 70–99)

## 2014-06-13 NOTE — Progress Notes (Signed)
Pt attended spiritual care group on grief and loss facilitated by counseling intern SwazilandJordan Austin and  chaplain Burnis KingfisherMatthew Berlie Hatchel. Group opened with brief discussion and psycho-social ed around grief and loss in relationships and in relation to self - identifying life patterns, circumstances, changes that cause losses. Established group norm of speaking from own life experience. Group goal of establishing open and affirming space for members to share loss and experience with grief, normalize grief experience and provide psycho social education and grief support.  Group drew on narrative and Alderian therapeutic modalities.   Eduardo French was present throughout group.  Initially had back turned and was coloring papers on table, turned toward group and engaged as another group member was sharing her story.  Eduardo French offered support and described suffering / grief in his life as "carrying a cross on my back," which "I often drop in the mud, but have to pick back up and continue going."  Spoke with group about his own mortality.    Eduardo French, Eduardo French Wayne MDiv

## 2014-06-13 NOTE — Progress Notes (Signed)
D: Pt denies SI/HI. Pt is hearing voices and says they are laughing at him. Pt is pleasant and cooperative. Pt rates depression at a 3, anxiety at a 5, and Helplessness/hopelessness at a 2.  A: Pt was offered support and encouragement. Pt was given scheduled medications. Pt was encourage to attend groups. Q 15 minute checks were done for safety.  R:Pt attends groups and interacts well with peers and staff. Pt taking medication. Pt has no complaints.Pt receptive to treatment and safety maintained on unit.

## 2014-06-13 NOTE — Progress Notes (Signed)
Morning Wellness Group 0915  The focus of this group is to educate the patient on the purpose and policies of crisis stabilization and provide a format to answer questions about their admission.  The group details unit policies and expectations of patients while admitted. 

## 2014-06-13 NOTE — Progress Notes (Signed)
Pt has been visible on the unit, spending most of the evening in the dayroom watching TV.  Pt reports he is feeling better.  He is still hearing the voices at times, but they have decreased.  At the time of our conversation, pt denies hearing voices.  Pt denies SI/HI.  His discharge plans are still in process.  Pt reports he has been going to groups and participating today.  He is pleasant/cooperative.  He denies any withdrawal symptoms at this time.  Support and encouragement offered.  Safety maintained with q15 minute checks.
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
## 2014-06-13 NOTE — Progress Notes (Signed)
Eduardo French  06/13/2014 12:28 PM Eduardo French  MRN:  [id number] Subjective:  Eduardo French is trying to get his life back together. Admits that is very hard for him to open up as he trusts hardly anyone based on past experiences. He still hearing voices. He likes the Haldol better than the Latuda. He would be willing to have the dose adjusted if it would help to get rid of the voices altogether. His mood disorder and the voices have triggered relapses in the past.  Does not want a high dose of Haldol cause the same side effects of Latuda as he could not function on it Principal Problem: <principal problem not specified> Diagnosis:   Patient Active Problem List   Diagnosis Date Noted  . Cocaine use disorder, mild, abuse [F14.10]   . Alcohol use disorder, moderate, dependence [F10.20]   . Bipolar 1 disorder, depressed, severe [F31.4] 06/10/2014  . Obesity (BMI 30.0-34.9) [E66.9] 04/11/2013  . Alcohol dependence [F10.20] 11/06/2012  . Cocaine dependence [F14.20] 11/06/2012  . Paranoid schizophrenia, chronic condition [F20.0] 11/06/2012  . Alcohol abuse [F10.10] 11/05/2012  . S/P cardiac catheterization, 09/14/12 with mild non obstructive CAD and EF of 55% [Z98.89] 09/15/2012  . HTN (hypertension), benign [I10] 09/12/2012  . DM type 2 (diabetes mellitus, type 2) [E11.9] 09/12/2012  . BPH (benign prostatic hyperplasia) [N40.0] 09/12/2012  . Unstable angina, cardiac cath without obstruction [I20.0] 09/12/2012  . Smoker [Z72.0] 09/12/2012  . 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 Depression [F32.9] 09/12/2012  . Bipolar disorder [F31.9] 09/12/2012  . Dyslipidemia- (LDL 190 in 2011) [E78.5] 09/12/2012   Total Time spent with patient: 30 minutes   Past Medical History:  Past Medical History  Diagnosis Date  . Hypertension   . Depression   . Diabetes mellitus without complication   . S/P cardiac catheterization, 09/14/12 with mild non obstructive CAD and EF of  55% 09/15/2012  . DJD (degenerative joint disease) of cervical spine     on disability  . Bipolar disorder     Past Surgical History  Procedure Laterality Date  . Back surgery    . Left heart catheterization with coronary angiogram N/A 09/14/2012    Procedure: LEFT HEART CATHETERIZATION WITH CORONARY ANGIOGRAM;  Surgeon: Sanda Klein, MD;  Location: [place] CATH [facility];  Service: Cardiovascular;  Laterality: N/A;   Family History:  Family History  Problem Relation Age of Onset  . Heart disease Brother     has had heart transplant  . Diabetes Mother   . Cancer Mother   . Hypertension Mother   . Cancer Brother   . Diabetes Brother   . Cancer Brother    Social History:  History  Alcohol Use  . 1.2 oz/week  . 2 Cans of beer per week     History  Drug Use No    History   Social History  . Marital Status: Legally Separated    Spouse Name: N/A    Number of Children: N/A  . Years of Education: N/A   Social History Main Topics  . Smoking status: Current Every Day Smoker -- 0.25 packs/day for 25 years    Types: Cigarettes    Last Attempt to Quit: 10/11/2012  . Smokeless tobacco: Never Used  . Alcohol Use: 1.2 oz/week    2 Cans of beer per week  . Drug Use: No  . Sexual Activity: Yes   Other Topics Concern  . None   Social History Narrative  Additional History:    Sleep: Poor  Appetite:  Fair   Assessment:   Musculoskeletal: Strength & Muscle Tone: within normal limits Gait & Station: normal Patient leans: N/A   Psychiatric Specialty Exam: Physical Exam  Review of Systems  Constitutional: Positive for malaise/fatigue.  HENT: Negative.   Eyes: Negative.   Respiratory: Negative.   Cardiovascular: Negative.   Gastrointestinal: Negative.   Genitourinary: Negative.   Musculoskeletal: Negative.   Skin: Negative.   Neurological: Negative.   Endo/Heme/Allergies: Negative.   Psychiatric/Behavioral: Positive for hallucinations and substance abuse. The patient  is nervous/anxious and has insomnia.     Blood pressure 151/73, pulse 79, temperature 97.3 F (36.3 C), temperature source Oral, resp. rate 18, height $RemoveBe'5\' 9"'ynGrPTgkJ$  (1.753 m), weight 74.844 kg (165 lb), SpO2 100 %.Body mass index is 24.36 kg/(m^2).  General Appearance: Fairly Groomed  Engineer, water::  Fair  Speech:  Clear and Coherent and not spontaneous  Volume:  Decreased  Mood:  Anxious and Depressed  Affect:  Restricted  Thought Process:  Coherent and Goal Directed  Orientation:  Full (Time, Place, and Person)  Thought Content:  symptoms events worries concerns  Suicidal Thoughts:  No  Homicidal Thoughts:  No  Memory:  Immediate;   Fair Recent;   Poor Remote;   Fair  Judgement:  Poor  Insight:  Present and Shallow  Psychomotor Activity:  Restlessness  Concentration:  Fair  Recall:  [place]  Language: Fair  Akathisia:  No  Handed:  Right  AIMS (if indicated):     Assets:  Desire for Improvement  ADL's:  Intact  Cognition: WNL  Sleep:  Number of Hours: 5.5     Current Medications: Current Facility-Administered Medications  Medication Dose Route Frequency Provider Last Rate Last Dose  . acetaminophen (TYLENOL) tablet 650 mg  650 mg Oral Q6H PRN Merian Capron, MD   650 mg at 06/12/14 2151  . alum & mag hydroxide-simeth (MAALOX/MYLANTA) 200-200-20 MG/5ML suspension 30 mL  30 mL Oral Q4H PRN Merian Capron, MD      . aspirin EC tablet 81 mg  81 mg Oral Daily Delfin Gant, NP   81 mg at 06/13/14 0755  . benztropine (COGENTIN) tablet 0.5 mg  0.5 mg Oral BID Shuvon Rankin, NP   0.5 mg at 06/13/14 0755  . escitalopram (LEXAPRO) tablet 20 mg  20 mg Oral Daily Delfin Gant, NP   20 mg at 06/13/14 0755  . haloperidol (HALDOL) tablet 1 mg  1 mg Oral BID Nicholaus Bloom, MD   1 mg at 06/13/14 0755  . lisinopril (PRINIVIL,ZESTRIL) tablet 40 mg  40 mg Oral Daily Delfin Gant, NP   40 mg at 06/13/14 0755  . magnesium hydroxide (MILK OF MAGNESIA) suspension 30  mL  30 mL Oral Daily PRN Merian Capron, MD      . metFORMIN (GLUCOPHAGE) tablet 1,000 mg  1,000 mg Oral BID WC Delfin Gant, NP   1,000 mg at 06/13/14 0755  . traZODone (DESYREL) tablet 50 mg  50 mg Oral QHS Merian Capron, MD   50 mg at 06/12/14 2151    Lab Results:  Results for orders placed or performed during the hospital encounter of 06/10/14 (from the past 48 hour(s))  Glucose, capillary     Status: Abnormal   Collection Time: 06/11/14  5:27 PM  Result Value Ref Range   Glucose-Capillary 197 (H) 70 - 99 mg/dL  TSH     Status:  None   Collection Time: 06/11/14  7:22 PM  Result Value Ref Range   TSH 3.302 0.350 - 4.500 uIU/mL    Comment: Performed at [facility]  CBC with Differential/Platelet     Status: None   Collection Time: 06/11/14  7:22 PM  Result Value Ref Range   WBC 7.1 4.0 - 10.5 K/uL   RBC 4.91 4.22 - 5.81 MIL/uL   Hemoglobin 15.3 13.0 - 17.0 g/dL   HCT 43.6 39.0 - 52.0 %   MCV 88.8 78.0 - 100.0 fL   MCH 31.2 26.0 - 34.0 pg   MCHC 35.1 30.0 - 36.0 g/dL   RDW 12.5 11.5 - 15.5 %   Platelets 263 150 - 400 K/uL   Neutrophils Relative % 51 43 - 77 %   Neutro Abs 3.6 1.7 - 7.7 K/uL   Lymphocytes Relative 41 12 - 46 %   Lymphs Abs 2.9 0.7 - 4.0 K/uL   Monocytes Relative 7 3 - 12 %   Monocytes Absolute 0.5 0.1 - 1.0 K/uL   Eosinophils Relative 1 0 - 5 %   Eosinophils Absolute 0.1 0.0 - 0.7 K/uL   Basophils Relative 0 0 - 1 %   Basophils Absolute 0.0 0.0 - 0.1 K/uL    Comment: Performed at [facility]  Comprehensive metabolic panel     Status: Abnormal   Collection Time: 06/11/14  7:22 PM  Result Value Ref Range   Sodium 135 135 - 145 mmol/L   Potassium 4.2 3.5 - 5.1 mmol/L   Chloride 103 96 - 112 mmol/L   CO2 24 19 - 32 mmol/L   Glucose, Bld 257 (H) 70 - 99 mg/dL   BUN 20 6 - 23 mg/dL   Creatinine, Ser 1.40 (H) 0.50 - 1.35 mg/dL   Calcium 9.2 8.4 - 10.5 mg/dL   Total Protein 7.4 6.0 - 8.3 g/dL   Albumin 4.1 3.5 - 5.2 g/dL    AST 30 0 - 37 U/L   ALT 35 0 - 53 U/L   Alkaline Phosphatase 70 39 - 117 U/L   Total Bilirubin 0.7 0.3 - 1.2 mg/dL   GFR calc non Af Amer 56 (L) >90 mL/min   GFR calc Af Amer 65 (L) >90 mL/min    Comment: (French) The eGFR has been calculated using the CKD EPI equation. This calculation has not been validated in all clinical situations. eGFR's persistently <90 mL/min signify possible Chronic Kidney Disease.    Anion gap 8 5 - 15    Comment: Performed at [facility]  Lipid panel     Status: Abnormal   Collection Time: 06/11/14  7:22 PM  Result Value Ref Range   Cholesterol 229 (H) 0 - 200 mg/dL   Triglycerides 454 (H) <150 mg/dL   HDL 45 >39 mg/dL   Total CHOL/HDL Ratio 5.1 RATIO   VLDL UNABLE TO CALCULATE IF TRIGLYCERIDE OVER 400 mg/dL 0 - 40 mg/dL   LDL Cholesterol UNABLE TO CALCULATE IF TRIGLYCERIDE OVER 400 mg/dL 0 - 99 mg/dL    Comment:        Total Cholesterol/HDL:CHD Risk Coronary Heart Disease Risk Table                     Men   Women  1/2 Average Risk   3.4   3.3  Average Risk       5.0   4.4  2 X Average Risk   9.6  7.1  3 X Average Risk  23.4   11.0        Use the calculated Patient Ratio above and the CHD Risk Table to determine the patient's CHD Risk.        ATP III CLASSIFICATION (LDL):  <100     mg/dL   Optimal  100-129  mg/dL   Near or Above                    Optimal  130-159  mg/dL   Borderline  160-189  mg/dL   High  >190     mg/dL   Very High Performed at [facility]   Hemoglobin A1c     Status: Abnormal   Collection Time: 06/11/14  7:22 PM  Result Value Ref Range   Hgb A1c MFr Bld 9.7 (H) 4.8 - 5.6 %    Comment: (French)         Pre-diabetes: 5.7 - 6.4         Diabetes: >6.4         Glycemic control for adults with diabetes: <7.0    Mean Plasma Glucose 232 mg/dL    Comment: (French) Performed At: [facility] [place], [place] [id number] Lindon Romp MD [id number] Performed at [facility]   Glucose, capillary     Status: Abnormal   Collection Time: 06/12/14  6:29 AM  Result Value Ref Range   Glucose-Capillary 230 (H) 70 - 99 mg/dL    Physical Findings: AIMS: Facial and Oral Movements Muscles of Facial Expression: None, normal Lips and Perioral Area: None, normal Jaw: None, normal Tongue: None, normal,Extremity Movements Upper (arms, wrists, hands, fingers): None, normal Lower (legs, knees, ankles, toes): None, normal, Trunk Movements Neck, shoulders, hips: None, normal, Overall Severity Severity of abnormal movements (highest score from questions above): None, normal Incapacitation due to abnormal movements: None, normal Patient's awareness of abnormal movements (rate only patient's report): No Awareness, Dental Status Current problems with teeth and/or dentures?: No Does patient usually wear dentures?: No  CIWA:    COWS:     Treatment Plan Summary: Daily contact with patient to assess and evaluate symptoms and progress in treatment and Medication management Alcohol/Cocaine dependence; continue to work on a relapse prevention plan Depression; continue the Lexapro and work with CBT/mindfulness Hallucinations: will increased the Haldol to 2 mg BID and watch for side effects Will continue to monitor his glucose and his BP Medical Decision Making:  Review of Psycho-Social Stressors (1), Review or order clinical lab tests (1), Review of Medication Regimen & Side Effects (2) and Review of New Medication or Change in Dosage (2)     Tyreonna Czaplicki A 06/13/2014, 12:28 PM

## 2014-06-14 LAB — GLUCOSE, CAPILLARY
GLUCOSE-CAPILLARY: 181 mg/dL — AB (ref 70–99)
GLUCOSE-CAPILLARY: 198 mg/dL — AB (ref 70–99)
Glucose-Capillary: 176 mg/dL — ABNORMAL HIGH (ref 70–99)

## 2014-06-14 MED ORDER — INSULIN ASPART 100 UNIT/ML ~~LOC~~ SOLN
0.0000 [IU] | Freq: Three times a day (TID) | SUBCUTANEOUS | Status: DC
  Administered 2014-06-15 (×2): 2 [IU] via SUBCUTANEOUS
  Administered 2014-06-15: 3 [IU] via SUBCUTANEOUS
  Administered 2014-06-16: 2 [IU] via SUBCUTANEOUS
  Administered 2014-06-16: 3 [IU] via SUBCUTANEOUS

## 2014-06-14 MED ORDER — INSULIN ASPART 100 UNIT/ML ~~LOC~~ SOLN: 0.0000 [IU] | Freq: Every day | SUBCUTANEOUS | Status: DC

## 2014-06-14 NOTE — [facility] Group Notes (Signed)
[facility]   06/14/2014 10:48 AM    Participation Quality:  Appropraite  Mood/Affect:  Appropriate  Depression Rating:  3  Anxiety Rating:  2  Thoughts of Suicide:  No  Will you contract for safety?   NA  Current AVH:  No  Plan for Discharge/Comments:  Patient attended discharge planning group and actively participated in group. Patient reports feeling much better today.  He will return to his home and follow up with [facility] at discharge.Suicide prevention education reviewed and SPE document provided.   Transportation Means: Patient has transportation.   Supports:  Patient has a support system.   [name], [name]

## 2014-06-14 NOTE — Progress Notes (Signed)
Pt reports he is doing a little better each day.  He says he was a little irritable this morning, but the day has gotten better as it progressed.   He still intends to return home at discharge.  He denies withdrawal symptoms this evening.  Pt denies SI/HI, but is still hearing voices off and on.  Pt reports he is still attending groups.  He has been out of his room this evening and interacting appropriately with his peers.  Support and encouragement offered.  Safety maintained with q15 minute checks.

## 2014-06-14 NOTE — [facility] Group Notes (Signed)
[facility] LCSW Group Therapy  Feelings Around Diagnosis 1:15 - 2:30 PM  06/14/2014 2:34 PM  Type of Therapy:  Group Therapy  Participation Level:  Did Not Attend  Wynn BankerHodnett, Cherysh Epperly Hairston 06/14/2014, 2:34 PM

## 2014-06-14 NOTE — Progress Notes (Signed)
Patient is flat, sad and depressed in affect today however states he is feeling better. Rates his depression at a 3/10, denies any hopelessness and rates his anxiety at a 2/10. Took morning meds without difficulty however opted not to go to lunch due to complaints of dizziness and weakness. Tray brought back for patient however he is also reporting some mild nausea. MD made aware and spoke with patient. Ginger ale provided and fall precautions reviewed. Patient also offered support and reassurance. Reminded of where call bell is placed in room and bathroom. He denies physical problems other than his chronic shoulder/neck pain for which he was medicated with good relief. No SI/HI/VH however endorses AH. When asked what they were saying he stated, "I don't know. I can't remember." Did not appear to want to talk further. Will continue to monitor closely. Eduardo French, Eduardo French

## 2014-06-14 NOTE — Progress Notes (Signed)
Pt reports he has had a good day.  He states he attended groups today.  His affect is brighter.  He denies SI/HI/AV.  His withdrawal symptoms are very mild.  He says he is still hearing voices, but it is not as bad as the previous day.  He feels the medications are working.  Pt makes his needs known to staff.  Support and encouragement offered.  Pt plans to return home at discharge and f/u with [facility].  Safety maintained with q15 minute checks.

## 2014-06-14 NOTE — Progress Notes (Signed)
[facility] MD Progress Note  06/14/2014 5:17 PM Eduardo French  MRN:  [id number] Subjective:  Eduardo French continues to work on his issues. He is having some light head ness  when he stands and he opted for staying in bed rather than going to the cafeteria for lunch but states that he felt better shortly after we met. He does not want to change his medications as he feels they are working well. He states he is going to leave all that drug and alcohol behind. He is recommitting to abstinence.  Principal Problem: <principal problem not specified> Diagnosis:   Patient Active Problem List   Diagnosis Date Noted  . Cocaine use disorder, mild, abuse [F14.10]   . Alcohol use disorder, moderate, dependence [F10.20]   . Bipolar 1 disorder, depressed, severe [F31.4] 06/10/2014  . Obesity (BMI 30.0-34.9) [E66.9] 04/11/2013  . Alcohol dependence [F10.20] 11/06/2012  . Cocaine dependence [F14.20] 11/06/2012  . Paranoid schizophrenia, chronic condition [F20.0] 11/06/2012  . Alcohol abuse [F10.10] 11/05/2012  . S/P cardiac catheterization, 09/14/12 with mild non obstructive CAD and EF of 55% [Z98.89] 09/15/2012  . HTN (hypertension), benign [I10] 09/12/2012  . DM type 2 (diabetes mellitus, type 2) [E11.9] 09/12/2012  . BPH (benign prostatic hyperplasia) [N40.0] 09/12/2012  . Unstable angina, cardiac cath without obstruction [I20.0] 09/12/2012  . Smoker [Z72.0] 09/12/2012  . 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 Depression [F32.9] 09/12/2012  . Bipolar disorder [F31.9] 09/12/2012  . Dyslipidemia- (LDL 190 in 2011) [E78.5] 09/12/2012   Total Time spent with patient: 30 minutes   Past Medical History:  Past Medical History  Diagnosis Date  . Hypertension   . Depression   . Diabetes mellitus without complication   . S/P cardiac catheterization, 09/14/12 with mild non obstructive CAD and EF of 55% 09/15/2012  . DJD (degenerative joint disease) of cervical spine     on disability  .  Bipolar disorder     Past Surgical History  Procedure Laterality Date  . Back surgery    . Left heart catheterization with coronary angiogram N/A 09/14/2012    Procedure: LEFT HEART CATHETERIZATION WITH CORONARY ANGIOGRAM;  Surgeon: Sanda Klein, MD;  Location: [place] CATH [facility];  Service: Cardiovascular;  Laterality: N/A;   Family History:  Family History  Problem Relation Age of Onset  . Heart disease Brother     has had heart transplant  . Diabetes Mother   . Cancer Mother   . Hypertension Mother   . Cancer Brother   . Diabetes Brother   . Cancer Brother    Social History:  History  Alcohol Use  . 1.2 oz/week  . 2 Cans of beer per week     History  Drug Use No    History   Social History  . Marital Status: Legally Separated    Spouse Name: N/A  . Number of Children: N/A  . Years of Education: N/A   Social History Main Topics  . Smoking status: Current Every Day Smoker -- 0.25 packs/day for 25 years    Types: Cigarettes    Last Attempt to Quit: 10/11/2012  . Smokeless tobacco: Never Used  . Alcohol Use: 1.2 oz/week    2 Cans of beer per week  . Drug Use: No  . Sexual Activity: Yes   Other Topics Concern  . None   Social History Narrative   Additional History:    Sleep: Fair  Appetite:  Fair   Assessment:   Musculoskeletal:  Strength & Muscle Tone: within normal limits Gait & Station: normal Patient leans: N/A   Psychiatric Specialty Exam: Physical Exam  Review of Systems  Constitutional: Negative.   HENT: Negative.   Eyes: Negative.   Respiratory: Negative.   Cardiovascular: Negative.   Gastrointestinal: Negative.   Genitourinary: Negative.   Musculoskeletal: Negative.   Skin: Negative.   Neurological: Negative.   Endo/Heme/Allergies: Negative.   Psychiatric/Behavioral: Positive for depression, hallucinations and substance abuse.    Blood pressure 151/73, pulse 79, temperature 97.3 F (36.3 C), temperature source Oral, resp. rate 18,  height _0  (1.753 m), weight 74.844 kg (165 lb), SpO2 100 %.Body mass index is 24.36 kg/(m^2).  General Appearance: Fairly Groomed  Engineer, water::  Fair  Speech:  Clear and Coherent  Volume:  Normal  Mood:  Anxious and worried  Affect:  anxious worried  Thought Process:  Coherent and Goal Directed  Orientation:  Full (Time, Place, and Person)  Thought Content:  symptoms events worries concerns  Suicidal Thoughts:  No  Homicidal Thoughts:  No  Memory:  Immediate;   Fair Recent;   Fair Remote;   Fair  Judgement:  Fair  Insight:  Present  Psychomotor Activity:  Restlessness  Concentration:  Fair  Recall:  [employer] of Knowledge:Fair  Language: Fair  Akathisia:  No  Handed:  Right  AIMS (if indicated):     Assets:  Desire for Improvement Housing  ADL's:  Intact  Cognition: WNL  Sleep:  Number of Hours: 6.75     Current Medications: Current Facility-Administered Medications  Medication Dose Route Frequency Provider Last Rate Last Dose  . acetaminophen (TYLENOL) tablet 650 mg  650 mg Oral Q6H PRN Merian Capron, MD   650 mg at 06/14/14 0850  . alum & mag hydroxide-simeth (MAALOX/MYLANTA) 200-200-20 MG/5ML suspension 30 mL  30 mL Oral Q4H PRN Merian Capron, MD      . aspirin EC tablet 81 mg  81 mg Oral Daily Delfin Gant, NP   81 mg at 06/14/14 0853  . benztropine (COGENTIN) tablet 0.5 mg  0.5 mg Oral BID Shuvon Rankin, NP   0.5 mg at 06/14/14 0850  . escitalopram (LEXAPRO) tablet 20 mg  20 mg Oral Daily Delfin Gant, NP   20 mg at 06/14/14 0851  . haloperidol (HALDOL) tablet 1 mg  1 mg Oral BID Nicholaus Bloom, MD   1 mg at 06/14/14 0850  . lisinopril (PRINIVIL,ZESTRIL) tablet 40 mg  40 mg Oral Daily Delfin Gant, NP   40 mg at 06/14/14 0851  . magnesium hydroxide (MILK OF MAGNESIA) suspension 30 mL  30 mL Oral Daily PRN Merian Capron, MD      . metFORMIN (GLUCOPHAGE) tablet 1,000 mg  1,000 mg Oral BID WC Delfin Gant, NP   1,000 mg at 06/14/14 0850  .  traZODone (DESYREL) tablet 50 mg  50 mg Oral QHS Merian Capron, MD   50 mg at 06/13/14 2222    Lab Results:  Results for orders placed or performed during the hospital encounter of 06/10/14 (from the past 48 hour(s))  Glucose, capillary     Status: Abnormal   Collection Time: 06/12/14 10:04 PM  Result Value Ref Range   Glucose-Capillary 230 (H) 70 - 99 mg/dL  Glucose, capillary     Status: Abnormal   Collection Time: 06/13/14  6:26 AM  Result Value Ref Range   Glucose-Capillary 229 (H) 70 - 99 mg/dL  Glucose, capillary  Status: Abnormal   Collection Time: 06/13/14 11:52 AM  Result Value Ref Range   Glucose-Capillary 229 (H) 70 - 99 mg/dL   Comment 1 Notify RN   Glucose, capillary     Status: Abnormal   Collection Time: 06/13/14  5:31 PM  Result Value Ref Range   Glucose-Capillary 180 (H) 70 - 99 mg/dL   Comment 1 Notify RN   Glucose, capillary     Status: Abnormal   Collection Time: 06/13/14  8:47 PM  Result Value Ref Range   Glucose-Capillary 222 (H) 70 - 99 mg/dL   Comment 1 Notify RN   Glucose, capillary     Status: Abnormal   Collection Time: 06/14/14  6:09 AM  Result Value Ref Range   Glucose-Capillary 176 (H) 70 - 99 mg/dL  Glucose, capillary     Status: Abnormal   Collection Time: 06/14/14 12:03 PM  Result Value Ref Range   Glucose-Capillary 181 (H) 70 - 99 mg/dL   Comment 1 Notify RN     Physical Findings: AIMS: Facial and Oral Movements Muscles of Facial Expression: None, normal Lips and Perioral Area: None, normal Jaw: None, normal Tongue: None, normal,Extremity Movements Upper (arms, wrists, hands, fingers): None, normal Lower (legs, knees, ankles, toes): None, normal, Trunk Movements Neck, shoulders, hips: None, normal, Overall Severity Severity of abnormal movements (highest score from questions above): None, normal Incapacitation due to abnormal movements: None, normal Patient's awareness of abnormal movements (rate only patient's report): No  Awareness, Dental Status Current problems with teeth and/or dentures?: No Does patient usually wear dentures?: No  CIWA:    COWS:     Treatment Plan Summary: Daily contact with patient to assess and evaluate symptoms and progress in treatment and Medication management Supportive approach/coping skills/relapse prevention Will reassess his medications particularly the Haldol and the Cogentin and see if they could be the cause of the symptoms he described. He does not want to change anything as feels the meds the way they are, are helping his mood, the hallucintations Continue to work on a relapse prevention plan Medical Decision Making:  Review of Psycho-Social Stressors (1), Review of Medication Regimen & Side Effects (2) and Review of New Medication or Change in Dosage (2)     Tasheka Houseman A 06/14/2014, 5:17 PM

## 2014-06-14 NOTE — Progress Notes (Signed)
Spoke with NP regarding patient's CBGs. Presently no order on chart to check them however staff has been monitoring and they have been elevated. NP reviewed chart and orders received to begin insulin coverage. Patient is agreeable and states, "if that's what I need to do, then that's what I'll do." Lawrence MarseillesFriedman, Zachory Mangual Eakes

## 2014-06-14 NOTE — Plan of Care (Signed)
Problem: Ineffective individual coping Goal: STG: Patient will remain free from self harm Outcome: Progressing Patient is denying SI and has not engaged in self harm.  Problem: Diagnosis: Increased Risk For Suicide Attempt Goal: STG-Patient Will Comply With Medication Regime Outcome: Progressing Patient has been med compliant and is able to verbalize need for prn meds.

## 2014-06-15 LAB — GLUCOSE, CAPILLARY
Glucose-Capillary: 173 mg/dL — ABNORMAL HIGH (ref 70–99)
Glucose-Capillary: 131 mg/dL — ABNORMAL HIGH (ref 70–99)
Glucose-Capillary: 140 mg/dL — ABNORMAL HIGH (ref 70–99)

## 2014-06-15 MED ORDER — LIVING WELL WITH DIABETES BOOK
Freq: Once | Status: AC
  Administered 2014-06-15: 1
  Filled 2014-06-15: qty 1

## 2014-06-15 NOTE — Progress Notes (Addendum)
Spoke with Marni Griffon, RN, Diabetes Coordinator, who states patient should obtain outpatient counseling rather than via her department regarding questions related to the initiation of insulin. She states he will need to see his PCP within the week as they will be managing his care. Explained patient was eager to learn more about his diabetes and that with this window of interest, he would benefit from a visit. She recommended this Probation officer do the routine bedside teaching, ordered the teaching book, and placed an order for the RD to see patient. Met with patient and provided Living Well with Diabetes Book. Answered questions for patient and showed patient how to draw up insulin and where to admin. Patient did demonstrate how to give insulin by using a capped needle and will have him self admin his 1700 dose.Talked about carb counting as the most useful tool in controlling his blood sugar and informed him an order had been placed for the RD to visit him. Patient verbalizes understanding however has additional specific questions. Jamie Kato

## 2014-06-15 NOTE — Progress Notes (Signed)
Attempted to reach Diabetes Nurse Educator via land line and pager. Patient was put on insulin this morning and needs education before discharge tomorrow. Awaiting call back. Lawrence MarseillesFriedman, Breta Demedeiros Eakes

## 2014-06-15 NOTE — [facility] Group Notes (Signed)
Patient was invited but did not attend the morning RN led wellness group. Eduardo French, Eduardo French

## 2014-06-15 NOTE — Progress Notes (Signed)
[facility] Group Notes:  (Nursing/MHT/Case Management/Adjunct)  Date:  06/15/2014  Time:  2100  Type of Therapy:  wrap up group  Participation Level:  Active  Participation Quality:  Appropriate, Attentive, Sharing and Supportive  Affect:  Appropriate  Cognitive:  Alert and Appropriate  Insight:  Good  Engagement in Group:  Engaged  Modes of Intervention:  Clarification, Education and Support   Summary of Progress/Problems:  Pt reports having been drinking, drugging, arguing, fighting,and fussing. Pt wants to go to a place of joy and happiness knowing that it wont always be good times. Pt is discharging home and reports it a happy and scary occasion. Scary because, "its a jungle out there". Pt shared that he does have support for when life gets hard, pastor, sponsor, and church members.   Shelah LewandowskySquires, Kayani Rapaport Carol 06/15/2014, 9:23 PM

## 2014-06-15 NOTE — [facility] Group Notes (Signed)
[facility] LCSW Group Therapy  [facility] [place] 1:15 - 2:30 PM  06/15/2014 2:30 PM   Type of Therapy:  Group Therapy  Participation Level: Active  Participation Quality:  Attentive  Affect:  Appropriate  Cognitive:  Appropriate  Insight:  Developing/Improving   Engagement in Therapy:  Developing/Improving   Modes of Intervention:  Discussion, Education, Exploration, Problem-Solving, Rapport Building, Support   Summary of Progress/Problems:   Patient was attentive to speaker from the [facility] as he shared his story of dealing with mental health/substance abuse issues and overcoming it by working a recovery program.  Patient expressed interest in their programs and services and received information on their agency.  He thanked the speaker for sharing his story.    Eduardo French, Eduardo French 06/15/2014 2:30 PM

## 2014-06-15 NOTE — Progress Notes (Addendum)
Inpatient Diabetes Program Recommendations  AACE/ADA: New Consensus Statement on Inpatient Glycemic Control (2013)  Target Ranges:  Prepandial:   less than 140 mg/dL      Peak postprandial:   less than 180 mg/dL (1-2 hours)      Critically ill patients:  140 - 180 mg/dL   Reason for Assessment: Diabetes Consult Diabetes history: DM2 Outpatient Diabetes medications: metformin 1000 mg bid, glipizide 5 mg bid Current orders for Inpatient glycemic control: Novolog moderate tidwc and hs  54 y.o. male patient admitted with Bipolar disorder, depressed, Alcohol use disorder, Cocaine abuse. Per medical record, pt is frustrated as he feels since being on medicaid he can't get the healthcare he needs to feel better and to be painfree.   Results for Sabino GasserHUNTLEY, Clerance L (MRN [id number]) as of 06/15/2014 11:28  Ref. Range 06/11/2014 19:22  Hemoglobin A1C Latest Range: 4.8-5.6 % 9.7 (H)  Results for Sabino GasserHUNTLEY, Pruitt L (MRN [id number]) as of 06/15/2014 11:28  Ref. Range 06/14/2014 06:09 06/14/2014 12:03 06/14/2014 21:03 06/15/2014 06:21  Glucose-Capillary Latest Range: 70-99 mg/dL 629176 (H) 528181 (H) 413198 (H) 173 (H)   Blood sugars acceptable today. Spoke with RN regarding pt's blood sugars and education.  Will order OP Diabetes Education consult for high HgbA1C. Pt will need close follow-up with PCP for diabetes management and medications. Ordered Living Well With Diabetes book from pharmacy.  [facility] PCP will discuss glycemic control goals with pt and follow.  Thank you. Ailene Ardshonda Tramon Crescenzo, RD, LDN, CDE Inpatient Diabetes Coordinator [phone]

## 2014-06-15 NOTE — Plan of Care (Signed)
Problem: Ineffective individual coping Goal: STG-Increase in ability to manage activities of daily living Outcome: Progressing Patient up, showered and dressed appropriately.  Problem: Diagnosis: Increased Risk For Suicide Attempt Goal: STG-Patient Will Report Suicidal Feelings to Staff Outcome: Progressing Patient denying SI and has not engaged in self harm.

## 2014-06-15 NOTE — Progress Notes (Addendum)
Patient up and dressed this morning. Affect brighter, mood appears stable. Rates his depression at a 1/10 and denies hopelessness and anxiety. Patient asking about discharge and encouraged to speak to MD. Confirms receiving insulin this morning (3 units) and patient is anxious to speak with Diabetes Educator as he has many questions. (Not previously on insulin.) Some information provided by this writer regarding diabetes however patient needs more extensive education. Attempted to reach Diabetes Educator. Discussed patient with MD, LCSW. Patient medicated per orders without difficulty. He denies SI/HI/VH and states he has not had AH today. Also denying any pain related to his chronic shoulder injury. States he is pleased with the medication regimen. Patient remains safe. Will continue to monitor. Eduardo MarseillesFriedman, Arlo Butt Eakes

## 2014-06-15 NOTE — Progress Notes (Signed)
[facility]  06/15/2014 2:04 PM Eduardo GasserLeslie L Byrum  MRN:  [id number] Subjective:  Verlon AuLeslie was started on Insulin to control his blood sugar. He is worried and concerned. Expresses a lot of guilt for having allowed himself not to manage his diabetes as well as for drinking alcohol what has also help to maintain a high blood sugar. He admits he is somewhat more depressed today  after having been started on Insulin. He is evidencing catastrophic thinking " tell me Doc., what is my life span now." He admits he tends to isolate, avoid when he gets to be this depressed. He is concerned about how he is going to handle things when he gets back home. He admits he does not have much support out there as he does not trust people. Reports that the voices are in the back ground but they are not as strong. Does not feel we need to increase the dose of Haldol Principal Problem: <principal problem not specified> Diagnosis:   Patient Active Problem List   Diagnosis Date Noted  . Cocaine use disorder, mild, abuse [F14.10]   . Alcohol use disorder, moderate, dependence [F10.20]   . Bipolar 1 disorder, depressed, severe [F31.4] 06/10/2014  . Obesity (BMI 30.0-34.9) [E66.9] 04/11/2013  . Alcohol dependence [F10.20] 11/06/2012  . Cocaine dependence [F14.20] 11/06/2012  . Paranoid schizophrenia, chronic condition [F20.0] 11/06/2012  . Alcohol abuse [F10.10] 11/05/2012  . S/P cardiac catheterization, 09/14/12 with mild non obstructive CAD and EF of 55% [Z98.89] 09/15/2012  . HTN (hypertension), benign [I10] 09/12/2012  . DM type 2 (diabetes mellitus, type 2) [E11.9] 09/12/2012  . BPH (benign prostatic hyperplasia) [N40.0] 09/12/2012  . Unstable angina, cardiac cath without obstruction [I20.0] 09/12/2012  . Smoker [Z72.0] 09/12/2012  . 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 Depression [F32.9] 09/12/2012  . Bipolar disorder [F31.9] 09/12/2012  . Dyslipidemia- (LDL 190 in 2011) [E78.5]  10/27/253605/03/2013   Total Time spent with patient: 30 minutes   Past Medical History:  Past Medical History  Diagnosis Date  . Hypertension   . Depression   . Diabetes mellitus without complication   . S/P cardiac catheterization, 09/14/12 with mild non obstructive CAD and EF of 55% 09/15/2012  . DJD (degenerative joint disease) of cervical spine     on disability  . Bipolar disorder     Past Surgical History  Procedure Laterality Date  . Back surgery    . Left heart catheterization with coronary angiogram N/A 09/14/2012    Procedure: LEFT HEART CATHETERIZATION WITH CORONARY ANGIOGRAM;  Surgeon: Thurmon FairMihai Croitoru, MD;  Location: [facility] CATH [facility];  Service: Cardiovascular;  Laterality: N/A;   Family History:  Family History  Problem Relation Age of Onset  . Heart disease Brother     has had heart transplant  . Diabetes Mother   . Cancer Mother   . Hypertension Mother   . Cancer Brother   . Diabetes Brother   . Cancer Brother    Social History:  History  Alcohol Use  . 1.2 oz/week  . 2 Cans of beer per week     History  Drug Use No    History   Social History  . Marital Status: Legally Separated    Spouse Name: N/A  . Number of Children: N/A  . Years of Education: N/A   Social History Main Topics  . Smoking status: Current Every Day Smoker -- 0.25 packs/day for 25 years    Types: Cigarettes  Last Attempt to Quit: 10/11/2012  . Smokeless tobacco: Never Used  . Alcohol Use: 1.2 oz/week    2 Cans of beer per week  . Drug Use: No  . Sexual Activity: Yes   Other Topics Concern  . None   Social History Narrative   Additional History:    Sleep: Fair  Appetite:  Poor   Assessment:   Musculoskeletal: Strength & Muscle Tone: within normal limits Gait & Station: normal Patient leans: N/A   Psychiatric Specialty Exam: Physical Exam  Review of Systems  Constitutional: Positive for malaise/fatigue.  HENT: Negative.   Eyes: Negative.   Respiratory: Negative.    Cardiovascular: Negative.   Gastrointestinal: Negative.   Genitourinary: Negative.   Musculoskeletal: Negative.   Skin: Negative.   Neurological: Positive for weakness.  Endo/Heme/Allergies: Negative.   Psychiatric/Behavioral: Positive for depression and substance abuse. The patient is nervous/anxious.     Blood pressure 128/81, pulse 74, temperature 97.5 F (36.4 C), temperature source Oral, resp. rate 16, height  (1.753 m), weight 74.844 kg (165 lb), SpO2 100 %.Body mass index is 24.36 kg/(m^2).  General Appearance: Fairly Groomed  Patent attorney::  Fair  Speech:  Clear and Coherent, Slow and not spontaneous  Volume:  Decreased  Mood:  Anxious and Depressed  Affect:  sad, anxious, worried  Thought Process:  Coherent and Goal Directed  Orientation:  Full (Time, Place, and Person)  Thought Content:  his symtpoms worries and concerns specially now after having been started on insulin  Suicidal Thoughts:  No  Homicidal Thoughts:  No  Memory:  Immediate;   Fair Recent;   Fair Remote;   Fair  Judgement:  Fair  Insight:  Present and Shallow  Psychomotor Activity:  Decreased  Concentration:  Fair  Recall:  [employer] of Knowledge:Fair  Language: Fair  Akathisia:  No  Handed:  Right  AIMS (if indicated):     Assets:  Desire for Improvement Housing  ADL's:  Intact  Cognition: WNL  Sleep:  Number of Hours: 6.75     Current Medications: Current Facility-Administered Medications  Medication Dose Route Frequency Provider Last Rate Last Dose  . acetaminophen (TYLENOL) tablet 650 mg  650 mg Oral Q6H PRN Thresa Ross, MD   650 mg at 06/14/14 1720  . alum & mag hydroxide-simeth (MAALOX/MYLANTA) 200-200-20 MG/5ML suspension 30 mL  30 mL Oral Q4H PRN Thresa Ross, MD      . aspirin EC tablet 81 mg  81 mg Oral Daily Earney Navy, NP   81 mg at 06/15/14 0802  . benztropine (COGENTIN) tablet 0.5 mg  0.5 mg Oral BID Shuvon Rankin, NP   0.5 mg at 06/15/14 0802  . escitalopram  (LEXAPRO) tablet 20 mg  20 mg Oral Daily Earney Navy, NP   20 mg at 06/15/14 0802  . haloperidol (HALDOL) tablet 1 mg  1 mg Oral BID Rachael Fee, MD   1 mg at 06/15/14 0802  . insulin aspart (novoLOG) injection 0-15 Units  0-15 Units Subcutaneous TID [facility] Lindwood Qua, NP   2 Units at 06/15/14 1159  . insulin aspart (novoLOG) injection 0-5 Units  0-5 Units Subcutaneous QHS Lindwood Qua, NP   0 Units at 06/14/14 2106  . lisinopril (PRINIVIL,ZESTRIL) tablet 40 mg  40 mg Oral Daily Earney Navy, NP   40 mg at 06/15/14 0802  . living well with diabetes book MISC   Does not apply Once Rachael Fee, MD      .  magnesium hydroxide (MILK OF MAGNESIA) suspension 30 mL  30 mL Oral Daily PRN Thresa Ross, MD      . metFORMIN (GLUCOPHAGE) tablet 1,000 mg  1,000 mg Oral BID WC Earney Navy, NP   1,000 mg at 06/15/14 0802  . traZODone (DESYREL) tablet 50 mg  50 mg Oral QHS Thresa Ross, MD   50 mg at 06/14/14 2106    Lab Results:  Results for orders placed or performed during the hospital encounter of 06/10/14 (from the past 48 hour(s))  Glucose, capillary     Status: Abnormal   Collection Time: 06/13/14  5:31 PM  Result Value Ref Range   Glucose-Capillary 180 (H) 70 - 99 mg/dL   Comment 1 Notify RN   Glucose, capillary     Status: Abnormal   Collection Time: 06/13/14  8:47 PM  Result Value Ref Range   Glucose-Capillary 222 (H) 70 - 99 mg/dL   Comment 1 Notify RN   Glucose, capillary     Status: Abnormal   Collection Time: 06/14/14  6:09 AM  Result Value Ref Range   Glucose-Capillary 176 (H) 70 - 99 mg/dL  Glucose, capillary     Status: Abnormal   Collection Time: 06/14/14 12:03 PM  Result Value Ref Range   Glucose-Capillary 181 (H) 70 - 99 mg/dL   Comment 1 Notify RN   Glucose, capillary     Status: Abnormal   Collection Time: 06/14/14  9:03 PM  Result Value Ref Range   Glucose-Capillary 198 (H) 70 - 99 mg/dL  Glucose, capillary     Status: Abnormal    Collection Time: 06/15/14  6:21 AM  Result Value Ref Range   Glucose-Capillary 173 (H) 70 - 99 mg/dL    Physical Findings: AIMS: Facial and Oral Movements Muscles of Facial Expression: None, normal Lips and Perioral Area: None, normal Jaw: None, normal Tongue: None, normal,Extremity Movements Upper (arms, wrists, hands, fingers): None, normal Lower (legs, knees, ankles, toes): None, normal, Trunk Movements Neck, shoulders, hips: None, normal, Overall Severity Severity of abnormal movements (highest score from questions above): None, normal Incapacitation due to abnormal movements: None, normal Patient's awareness of abnormal movements (rate only patient's report): No Awareness, Dental Status Current problems with teeth and/or dentures?: No Does patient usually wear dentures?: No  CIWA:    COWS:     Treatment Plan Summary: Daily contact with patient to assess and evaluate symptoms and progress in treatment and Medication management Supportive approach/coping skills/relapse prevention Will continue to work on a relapse prevention plan Will educate further on Diabetes and the use of insulin Will also reinforce the need to abstain from drinking alcohol Note: Brodi has been observed to be more depressed today after the use of insulin was recommended. He also reported being more aware of the voices in the background. He would rather not have the medication dosage increased. Will go ahead and observed for the next 24 hours to support, educate further and reassess for the need to increase the Haldol. He is already on 20 mg of Lexapro. Under this acute stress he is at higher risk of relapse with all the negative consequences that a relapse would bring with it.   Medical Decision Making:  Review of Psycho-Social Stressors (1), Review or order clinical lab tests (1), New Problem, with no additional work-up planned (3), Review of Medication Regimen & Side Effects (2) and Review of New Medication  or Change in Dosage (2)     Dong Nimmons A 06/15/2014, 2:04  PM

## 2014-06-16 LAB — GLUCOSE, CAPILLARY
GLUCOSE-CAPILLARY: 178 mg/dL — AB (ref 70–99)
Glucose-Capillary: 133 mg/dL — ABNORMAL HIGH (ref 70–99)

## 2014-06-16 MED ORDER — TRAZODONE HCL 50 MG PO TABS: 50.0000 mg | ORAL_TABLET | Freq: Every day | ORAL | Status: DC

## 2014-06-16 MED ORDER — ASPIRIN 81 MG PO TABS: 81.0000 mg | ORAL_TABLET | Freq: Every day | ORAL | Status: DC

## 2014-06-16 MED ORDER — LISINOPRIL 40 MG PO TABS: 40.0000 mg | ORAL_TABLET | Freq: Every day | ORAL | Status: DC

## 2014-06-16 MED ORDER — BENZTROPINE MESYLATE 0.5 MG PO TABS: 0.5000 mg | ORAL_TABLET | Freq: Two times a day (BID) | ORAL | Status: DC

## 2014-06-16 MED ORDER — ESCITALOPRAM OXALATE 20 MG PO TABS: 20.0000 mg | ORAL_TABLET | Freq: Every day | ORAL | Status: DC

## 2014-06-16 MED ORDER — ATORVASTATIN CALCIUM 40 MG PO TABS: 40.0000 mg | ORAL_TABLET | Freq: Every day | ORAL | Status: DC

## 2014-06-16 MED ORDER — METFORMIN HCL 1000 MG PO TABS: 1000.0000 mg | ORAL_TABLET | Freq: Two times a day (BID) | ORAL | Status: DC

## 2014-06-16 MED ORDER — HALOPERIDOL 1 MG PO TABS: 1.0000 mg | ORAL_TABLET | Freq: Two times a day (BID) | ORAL | Status: DC

## 2014-06-16 MED ORDER — ACETAMINOPHEN 500 MG PO TABS: 500.0000 mg | ORAL_TABLET | Freq: Four times a day (QID) | ORAL | Status: DC | PRN

## 2014-06-16 NOTE — [facility] Suicide Risk Assessment (Signed)
[facility] Discharge Suicide Risk Assessment   Demographic Factors:  Male  Total Time spent with patient: 30 minutes  Musculoskeletal: Strength & Muscle Tone: within normal limits Gait & Station: normal Patient leans: N/A  Psychiatric Specialty Exam: Physical Exam  Review of Systems  Constitutional: Negative.   HENT: Negative.   Eyes: Negative.   Respiratory: Negative.   Cardiovascular: Negative.   Gastrointestinal: Negative.   Genitourinary: Negative.   Musculoskeletal: Negative.   Skin: Negative.   Neurological: Negative.   Endo/Heme/Allergies: Negative.   Psychiatric/Behavioral: Positive for substance abuse. The patient is nervous/anxious.     Blood pressure 134/87, pulse 69, temperature 97.5 F (36.4 C), temperature source Oral, resp. rate 16, height  (1.753 m), weight 74.844 kg (165 lb), SpO2 100 %.Body mass index is 24.36 kg/(m^2).  General Appearance: Fairly Groomed  Patent attorney::  Fair  Speech:  Clear and Coherent409  Volume:  Normal  Mood:  Euthymic  Affect:  Appropriate  Thought Process:  Coherent and Goal Directed  Orientation:  Full (Time, Place, and Person)  Thought Content:  plans as he moves on, relapse prevention plan  Suicidal Thoughts:  No  Homicidal Thoughts:  No  Memory:  Immediate;   Fair Recent;   Fair Remote;   Fair  Judgement:  Fair  Insight:  Present  Psychomotor Activity:  Normal  Concentration:  Fair  Recall:  [employer] of Knowledge:Fair  Language: Fair  Akathisia:  No  Handed:  Right  AIMS (if indicated):     Assets:  Desire for Improvement Housing  Sleep:  Number of Hours: 6.5  Cognition: WNL  ADL's:  Intact   Have you used any form of tobacco in the last 30 days? (Cigarettes, Smokeless Tobacco, Cigars, and/or Pipes): Yes  Has this patient used any form of tobacco in the last 30 days? (Cigarettes, Smokeless Tobacco, Cigars, and/or Pipes) Yes, A prescription for an FDA-approved tobacco cessation medication was offered at  discharge and the patient refused  Mental Status Per Nursing Assessment::   On Admission:  Suicidal ideation indicated by patient  Current Mental Status by Physician: In full contact with reality. There are no active SI plans or intent. There are no active S/S of withdrawal. He states he feels ready to be D/C home. He states the medications are working well, denies hearing any voices. His mood is stable. He states the is committed to abstaining, to better manage his Diabetes. He plans to reach out, get involved, not to isolate as he was doing before   Loss Factors: Decline in physical health, loss of significant relationship  Historical Factors: NA  Risk Reduction Factors:   Sense of responsibility to family and Positive social support  Continued Clinical Symptoms:  Bipolar Disorder:   Depressive phase Alcohol/Substance Abuse/Dependencies  Cognitive Features That Contribute To Risk:  Closed-mindedness, Polarized thinking and Thought constriction (tunnel vision)    Suicide Risk:  Minimal: No identifiable suicidal ideation.  Patients presenting with no risk factors but with morbid ruminations; may be classified as minimal risk based on the severity of the depressive symptoms  Principal Problem: <principal problem not specified> Discharge Diagnoses:  Patient Active Problem List   Diagnosis Date Noted  . Cocaine use disorder, mild, abuse [F14.10]   . Alcohol use disorder, moderate, dependence [F10.20]   . Bipolar 1 disorder, depressed, severe [F31.4] 06/10/2014  . Obesity (BMI 30.0-34.9) [E66.9] 04/11/2013  . Alcohol dependence [F10.20] 11/06/2012  . Cocaine dependence [F14.20] 11/06/2012  . Paranoid schizophrenia, chronic  condition [F20.0] 11/06/2012  . Alcohol abuse [F10.10] 11/05/2012  . S/P cardiac catheterization, 09/14/12 with mild non obstructive CAD and EF of 55% [Z98.89] 09/15/2012  . HTN (hypertension), benign [I10] 09/12/2012  . DM type 2 (diabetes mellitus, type 2)  [E11.9] 09/12/2012  . BPH (benign prostatic hyperplasia) [N40.0] 09/12/2012  . Unstable angina, cardiac cath without obstruction [I20.0] 09/12/2012  . Smoker [Z72.0] 09/12/2012  . 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 Depression [F32.9] 09/12/2012  . Bipolar disorder [F31.9] 09/12/2012  . Dyslipidemia- (LDL 190 in 2011) [E78.5] 09/12/2012    Follow-up Information    Follow up with Deatra RobinsonKaren Jones [facility] On 06/26/2014.   Why:  You are scheduled with Deatra RobinsonKaren Jones on Monday, June 26, 2014 at 4:20 PM   Contact information:   [address] [place], [place]   [id number]  [phone]      Plan Of Care/Follow-up recommendations:  Activity:  as tolerated Diet:  regular Follow up [facility] as above. Follow up with AA Is patient on multiple antipsychotic therapies at discharge:  No   Has Patient had three or more failed trials of antipsychotic monotherapy by history:  No  Recommended Plan for Multiple Antipsychotic Therapies: NA    Ayahna Solazzo A 06/16/2014, 11:54 AM

## 2014-06-16 NOTE — Plan of Care (Signed)
Problem: Diagnosis: Increased Risk For Suicide Attempt Goal: STG-Patient Will Attend All Groups On The Unit Outcome: Progressing Pt attended evening group on 06/15/14.  Problem: Alteration in mood Goal: LTG-Patient reports reduction in suicidal thoughts (Patient reports reduction in suicidal thoughts and is able to verbalize a safety plan for whenever patient is feeling suicidal)  Outcome: Progressing Pt denied SI this shift and verbally contracted for safety.

## 2014-06-16 NOTE — Progress Notes (Signed)
D: Pt has appropriate affect and pleasant mood.  Pt reports his day has been "pretty good."  When asked about his goal, pt stated "I've listened and learned as much as I could in groups.  I'm trying to focus on what I can do when I get out of here."  Pt denies SI/HI, denies hallucinations.  Pt attended evening group.  He interacts appropriately with staff and peers.   A: Medications administered per order.  PRN medication administered for pain, see flowsheet.  Safety maintained.  Met with pt 1:1 and provided support and encouragement.   R: Pt is compliant with medications.  He verbally contracts for safety.  Pt reports he will notify staff of needs and concerns.  Will continue to monitor and assess for safety.

## 2014-06-16 NOTE — Discharge Summary (Signed)
Physician Discharge Summary Note  Patient:  Eduardo French is an 54 y.o., male MRN:  [id number] DOB:  1960/09/29 Patient phone:  [phone] (home)  Patient address:   [address] [place] [address],  Total Time spent with patient: Greater than 30 minutes  Date of Admission:  06/10/2014  Date of Discharge: 06/16/14  Reason for Admission: Mood stabilization  Principal Problem: <principal problem not specified> Discharge Diagnoses: Patient Active Problem List   Diagnosis Date Noted  . Cocaine dependence [F14.20] 11/06/2012    Priority: Medium  . Paranoid schizophrenia, chronic condition [F20.0] 11/06/2012    Priority: Medium  . Cocaine use disorder, mild, abuse [F14.10]   . Alcohol use disorder, moderate, dependence [F10.20]   . Bipolar 1 disorder, depressed, severe [F31.4] 06/10/2014  . Obesity (BMI 30.0-34.9) [E66.9] 04/11/2013  . Alcohol dependence [F10.20] 11/06/2012  . Alcohol abuse [F10.10] 11/05/2012  . S/P cardiac catheterization, 09/14/12 with mild non obstructive CAD and EF of 55% [Z98.89] 09/15/2012  . HTN (hypertension), benign [I10] 09/12/2012  . DM type 2 (diabetes mellitus, type 2) [E11.9] 09/12/2012  . BPH (benign prostatic hyperplasia) [N40.0] 09/12/2012  . Unstable angina, cardiac cath without obstruction [I20.0] 09/12/2012  . Smoker [Z72.0] 09/12/2012  . 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 Depression [F32.9] 09/12/2012  . Bipolar disorder [F31.9] 09/12/2012  . Dyslipidemia- (LDL 190 in 2011) [E78.5] 09/12/2012   Musculoskeletal: Strength & Muscle Tone: within normal limits Gait & Station: normal Patient leans: N/A  Psychiatric Specialty Exam: Physical Exam  Psychiatric: His speech is normal and behavior is normal. Judgment and thought content normal. His mood appears not anxious. His affect is not angry, not blunt, not labile and not inappropriate. Cognition and memory are normal. He does not exhibit a  depressed mood.    Review of Systems  Constitutional: Negative.   Eyes: Negative.   Cardiovascular: Negative.   Gastrointestinal: Negative.   Genitourinary: Negative.   Skin: Negative.   Neurological: Negative.   Psychiatric/Behavioral: Positive for depression (Stable), hallucinations (Hx of) and substance abuse (Cocaine & Alcohol dependence). Negative for suicidal ideas and memory loss. The patient has insomnia (Stable). The patient is not nervous/anxious.     Blood pressure 134/87, pulse 69, temperature 97.5 F (36.4 C), temperature source Oral, resp. rate 16, height 5\' 9"  (1.753 m), weight 74.844 kg (165 lb), SpO2 100 %.Body mass index is 24.36 kg/(m^2).  See Md's SRA   Past Medical History:  Past Medical History  Diagnosis Date  . Hypertension   . Depression   . Diabetes mellitus without complication   . S/P cardiac catheterization, 09/14/12 with mild non obstructive CAD and EF of 55% 09/15/2012  . DJD (degenerative joint disease) of cervical spine     on disability  . Bipolar disorder     Past Surgical History  Procedure Laterality Date  . Back surgery    . Left heart catheterization with coronary angiogram N/A 09/14/2012    Procedure: LEFT HEART CATHETERIZATION WITH CORONARY ANGIOGRAM;  Surgeon: Thurmon Fair, MD;  Location: [facility] CATH LAB;  Service: Cardiovascular;  Laterality: N/A;   Family History:  Family History  Problem Relation Age of Onset  . Heart disease Brother     has had heart transplant  . Diabetes Mother   . Cancer Mother   . Hypertension Mother   . Cancer Brother   . Diabetes Brother   . Cancer Brother    Social History:  History  Alcohol  Use  . 1.2 oz/week  . 2 Cans of beer per week     History  Drug Use No    History   Social History  . Marital Status: Legally Separated    Spouse Name: N/A  . Number of Children: N/A  . Years of Education: N/A   Social History Main Topics  . Smoking status: Current Every Day Smoker -- 0.25 packs/day for  25 years    Types: Cigarettes    Last Attempt to Quit: 10/11/2012  . Smokeless tobacco: Never Used  . Alcohol Use: 1.2 oz/week    2 Cans of beer per week  . Drug Use: No  . Sexual Activity: Yes   Other Topics Concern  . None   Social History Narrative   Risk to Self: Is patient at risk for suicide?: Yes What has been your use of drugs/alcohol within the last 12 months?: Fifth of alcohol daily for last 3 years (off and on); cocaine once or twice a month for last 18 months and THC "every once and awhile"  Risk to Others: No  Prior Inpatient Therapy: Yes  Prior Outpatient Therapy: Yes  Level of Care:  OP  Hospital Course:  Patient states that he has had two deaths last year and afterwards his drinking increased and depression worsened. "My brother died in June of last year and my mother died couple months later. I got depressed and I just got to the point that I really didn't want to live anymore. The deaths and the pain was just so overwhelming.Patient states that he has a history of hearing voices that call his name and seeing shadows or flashes of light out of the corner of his eye. Patient states that [facility]. "I was taking the Latuda but it just made me tired all the time. I was taking the Haldol which I feel like it worked better".  After admission assessment/evaluation, it was determined based based on his reported worsening symptoms of depression that Eduardo AuLeslie will need medication management to re-stabilize his depressed mood. He was also presenting with auditory hallucinations. He was then medicated and discharged on; Haldol 1 mg twice daily for mood control as he indicated it had helped him in the past, Benztropine 0.5 mg twice daily for prevention of EPS, Lexapro 20 mg daily for depression & Trazodone 50 mg Q bedtime for insomnia. He was resumed on all his pertinent home medications for his other medical issues that he presented. He  tolerated his treatment regimen without any adverse effects reported. Eduardo AuLeslie enrolled & participated in the group counseling sessions being offered and held on this unit. He learned coping skills.   Eduardo French symptoms responded well to his treatment regimen. This is evidenced by his reports of improved mood and absence of suicidal ideations. Part of his discharge plans included a referral and an appointment to an outpatient psychiatric clinic for further psychiatric treatments and medication management. He will be seeing Deatra RobinsonKaren Jones at the [facility] [place] in [place], [place]. He is provided with all the necessary information needed to make this appointment without problems.  Upon discharge, he adamantly denies any SIHI, AVH, delusional thoughts and or paranoia. [facility], a 4 days worth supply samples of his [facility] discharge medications. Eduardo [facility]Leslie left [facility] with all personal belongings in no apparent distress. Transportation per city bus [facility] assisted with bus pass.    Consults:  psychiatry  Significant Diagnostic Studies:  labs:  CBC with diff, CMP, UDS, Toxicology tests, U/A, TSH, HGBA1C, reports reviewed, stable  Discharge Vitals:   Blood pressure 134/87, pulse 69, temperature 97.5 F (36.4 C), temperature source Oral, resp. rate 16, height  (1.753 m), weight 74.844 kg (165 lb), SpO2 100 %. Body mass index is 24.36 kg/(m^2). Lab Results:   Results for orders placed or performed during the hospital encounter of 06/10/14 (from the past 72 hour(s))  Glucose, capillary     Status: Abnormal   Collection Time: 06/13/14  5:31 PM  Result Value Ref Range   Glucose-Capillary 180 (H) 70 - 99 mg/dL   Comment 1 Notify RN   Glucose, capillary     Status: Abnormal   Collection Time: 06/13/14  8:47 PM  Result Value Ref Range   Glucose-Capillary 222 (H) 70 - 99 mg/dL   Comment 1 Notify RN   Glucose, capillary     Status: Abnormal   Collection Time: 06/14/14  6:09 AM  Result Value  Ref Range   Glucose-Capillary 176 (H) 70 - 99 mg/dL  Glucose, capillary     Status: Abnormal   Collection Time: 06/14/14 12:03 PM  Result Value Ref Range   Glucose-Capillary 181 (H) 70 - 99 mg/dL   Comment 1 Notify RN   Glucose, capillary     Status: Abnormal   Collection Time: 06/14/14  9:03 PM  Result Value Ref Range   Glucose-Capillary 198 (H) 70 - 99 mg/dL  Glucose, capillary     Status: Abnormal   Collection Time: 06/15/14  6:21 AM  Result Value Ref Range   Glucose-Capillary 173 (H) 70 - 99 mg/dL  Glucose, capillary     Status: Abnormal   Collection Time: 06/15/14  5:26 PM  Result Value Ref Range   Glucose-Capillary 131 (H) 70 - 99 mg/dL   Comment 1 Notify RN   Glucose, capillary     Status: Abnormal   Collection Time: 06/15/14  8:48 PM  Result Value Ref Range   Glucose-Capillary 140 (H) 70 - 99 mg/dL   Comment 1 Notify RN    Comment 2 Document in Chart   Glucose, capillary     Status: Abnormal   Collection Time: 06/16/14  6:19 AM  Result Value Ref Range   Glucose-Capillary 178 (H) 70 - 99 mg/dL   Comment 1 Notify RN    Comment 2 Document in Chart   Glucose, capillary     Status: Abnormal   Collection Time: 06/16/14 11:52 AM  Result Value Ref Range   Glucose-Capillary 133 (H) 70 - 99 mg/dL   Comment 1 Notify RN    Physical Findings: AIMS: Facial and Oral Movements Muscles of Facial Expression: None, normal Lips and Perioral Area: None, normal Jaw: None, normal Tongue: None, normal,Extremity Movements Upper (arms, wrists, hands, fingers): None, normal Lower (legs, knees, ankles, toes): None, normal, Trunk Movements Neck, shoulders, hips: None, normal, Overall Severity Severity of abnormal movements (highest score from questions above): None, normal Incapacitation due to abnormal movements: None, normal Patient's awareness of abnormal movements (rate only patient's report): No Awareness, Dental Status Current problems with teeth and/or dentures?: No Does patient  usually wear dentures?: No  CIWA:    COWS:     See Psychiatric Specialty Exam and Suicide Risk Assessment completed by Attending Physician prior to discharge.  Discharge destination:  Home  Is patient on multiple antipsychotic therapies at discharge:  No   Has Patient had three or more failed trials of antipsychotic monotherapy by history:  No  Recommended Plan for Multiple Antipsychotic Therapies: NA    Medication List    STOP taking these medications        glipiZIDE 5 MG tablet  Commonly known as:  GLUCOTROL     naproxen sodium 220 MG tablet  Commonly known as:  ANAPROX      TAKE these medications      Indication   acetaminophen 500 MG tablet  Commonly known as:  TYLENOL  Take 1 tablet (500 mg total) by mouth every 6 (six) hours as needed for mild pain or headache.   Indication:  Headaches     aspirin 81 MG tablet  Take 1 tablet (81 mg total) by mouth daily. For heart health   Indication:  Heart health     atorvastatin 40 MG tablet  Commonly known as:  LIPITOR  Take 1 tablet (40 mg total) by mouth daily. For high cholesterol   Indication:  Inherited Homozygous Hypercholesterolemia, Increased Fats, Triglycerides & Cholesterol in the Blood     benztropine 0.5 MG tablet  Commonly known as:  COGENTIN  Take 1 tablet (0.5 mg total) by mouth 2 (two) times daily. For prevention of drug induced tremors   Indication:  Extrapyramidal Reaction caused by Medications     escitalopram 20 MG tablet  Commonly known as:  LEXAPRO  Take 1 tablet (20 mg total) by mouth daily. For depression   Indication:  Depression, Generalized Anxiety Disorder     haloperidol 1 MG tablet  Commonly known as:  HALDOL  Take 1 tablet (1 mg total) by mouth 2 (two) times daily. For mood control   Indication:  Mood contrl     lisinopril 40 MG tablet  Commonly known as:  PRINIVIL,ZESTRIL  Take 1 tablet (40 mg total) by mouth daily. For high blood pressure   Indication:  High Blood Pressure      metFORMIN 1000 MG tablet  Commonly known as:  GLUCOPHAGE  Take 1 tablet (1,000 mg total) by mouth 2 (two) times daily with a meal. For high blood sugar control   Indication:  Type 2 Diabetes     traZODone 50 MG tablet  Commonly known as:  DESYREL  Take 1 tablet (50 mg total) by mouth at bedtime. For sleep   Indication:  Trouble Sleeping       Follow-up Information    Follow up with Marco Collie On 06/26/2014.   Why:  You are scheduled with Deatra Robinson on Monday, June 26, 2014 at 4:20 PM   Contact information:   [address] [place], [place]   [address]  [phone]     Follow-up recommendations: Activity:  As tolerated Diet: As recommended by your primary care doctor. Keep all scheduled follow-up appointments as recommended.   Comments: Take all your medications as prescribed by your mental healthcare provider. Report any adverse effects and or reactions from your medicines to your outpatient provider promptly. Patient is instructed and cautioned to not engage in alcohol and or illegal drug use while on prescription medicines. In the event of worsening symptoms, patient is instructed to call the crisis hotline, 911 and or go to the nearest ED for appropriate evaluation and treatment of symptoms. Follow-up with your primary care provider for your other medical issues, concerns and or health care needs.   Total Discharge Time:   Signed: Sanjuana Kava, PMHNP, FNP-BC 06/16/2014, 2:17 PM  I personally assessed the patient and formulated the plan Madie Reno A. Dub Mikes, M.D.
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
## 2014-06-16 NOTE — Tx Team (Signed)
Interdisciplinary Treatment Plan Update   Date Reviewed:  06/16/2014  Time Reviewed:  10:07 AM  Progress in Treatment:   Attending groups: Yes, patient is attending groups Participating in groups: Yes, patient engages in discusson Taking medication as prescribed: Yes  Tolerating medication: Yes Family/Significant other contact made:  No, patient declined collateral contact Patient understands diagnosis: Yes, patient understands diagnosis and need for treatment. Discussing patient identified problems/goals with staff: Yes, patient is able to express goals for treatment and discharge. Medical problems stabilized or resolved: Yes Denies suicidal/homicidal ideation: Yes Patient has not harmed self or others: Yes  For review of initial/current patient goals, please see plan of care.  Estimated Length of Stay:  Discharge today  Reasons for Continued Hospitalization:   New Problems/Goals identified:    Discharge Plan or Barriers:   Home with outpatient [facility]  Additional Comments:     Patient and CSW reviewed patient's identified goals and treatment plan.  Patient verbalized understanding and agreed to treatment plan.   Attendees:  Patient:  06/16/2014 10:07 AM   Signature:  Sallyanne HaversF. Cobos, MD 06/16/2014 10:07 AM  Signature: Geoffery LyonsIrving Lugo, MD 06/16/2014 10:07 AM  Signature:  Merian CapronMarian Friedman, RN 06/16/2014 10:07 AM  Signature: 06/16/2014 10:07 AM  Signature:   06/16/2014 10:07 AM  Signature:  Juline PatchQuylle Maxmilian Trostel, LCSW 06/16/2014 10:07 AM  Signature:  Belenda CruiseKristin Drinkard, LCSW-A 06/16/2014 10:07 AM  Signature:  Leisa LenzValerie Enoch, Care Coordinator [facility] 06/16/2014 10:07 AM  Signature:   06/16/2014 10:07 AM  Signature:  06/16/2014  10:07 AM  Signature:   Onnie BoerJennifer Clark, RN [facility] 06/16/2014  10:07 AM  Signature:   06/16/2014  10:07 AM    Scribe for Treatment Team:   Juline PatchQuylle Aqua Denslow,  06/16/2014 10:07 AM

## 2014-06-16 NOTE — Progress Notes (Signed)
Patient packed and ready for discharge. Teaching reviewed, Rx's given and meds supplied. Strongly stressed to patient he needs to call his PCP as soon as able to discuss diabetes management. Patient verbalized understanding. Belongings returned to patient. Denies SI/HI. Discharged in stable condition to son. Lawrence MarseillesFriedman, Lachele Lievanos Eakes

## 2014-06-16 NOTE — [facility] Group Notes (Signed)
[facility] LCSW Aftercare Discharge Planning Group Note   06/16/2014 10:04 AM  Participation Quality:  Active   Mood/Affect:  Appropriate  Depression Rating:  1  Anxiety Rating:  1  Thoughts of Suicide:  No Will you contract for safety?   NA  Current AVH:  NA  Plan for Discharge/Comments:  Pt plans to return home and follow up with [facility]. He is feeling anxious about going home because he is afraid to fail. However, he stated he was hopeful for the future and discussed his plan for recovery.   Transportation Means: Bus   Supports: Family   Hyatt,Candace

## 2014-06-16 NOTE — Progress Notes (Signed)
  [facility] Adult [facility] Management Discharge Plan :  Will you be returning to the same living situation after discharge:  Yes,  Patient is returning home at discharge. At discharge, do you have transportation home?: Yes,  Patient assisted with bus pass. Do you have the ability to pay for your medications: Yes,  Patient has Medicaid.  Release of information consent forms completed and in the chart;  Patient's signature needed at discharge.  Patient to Follow up at: Follow-up Information    Follow up with Deatra RobinsonKaren Jones [facility] On 06/26/2014.   Why:  You are scheduled with Deatra RobinsonKaren Jones on Monday, June 26, 2014 at 4:20 PM   Contact information:   [address] [place], [place]   [id number]  [phone]      Patient denies SI/HI: Patient no longer endorsing SI/HI or other thoughts of self harm.   Safety Planning and Suicide Prevention discussed: .Reviewed with all patients during discharge planning group   Have you used any form of tobacco in the last 30 days? (Cigarettes, Smokeless Tobacco, Cigars, and/or Pipes): Yes  Has patient been referred to the Quitline?:  Patient declined referral to Quitline.   Wynn BankerHodnett, Eduardo French 06/16/2014, 12:24 PM

## 2014-06-21 NOTE — Progress Notes (Signed)
Patient Discharge Instructions:  After Visit Summary (AVS):   Faxed to:  06/21/14 Discharge Summary Note:   Faxed to:  06/21/14 Psychiatric Admission Assessment Note:   Faxed to:  06/21/14 Suicide Risk Assessment - Discharge Assessment:   Faxed to:  06/21/14 Faxed/Sent to the Next Level Care provider:  06/21/14 [facility] to [facility] @ [phone]  Jerelene ReddenSheena E [place], 06/21/2014, 2:23 PM

## 2014-07-09 ENCOUNTER — Encounter (HOSPITAL_COMMUNITY): Payer: Self-pay | Admitting: Emergency Medicine

## 2014-07-09 ENCOUNTER — Emergency Department (HOSPITAL_COMMUNITY)
Admission: EM | Admit: 2014-07-09 | Discharge: 2014-07-09 | Discharge status: Against Medical Advice | Payer: Medicaid Other | Attending: Emergency Medicine | Admitting: Emergency Medicine

## 2014-07-09 DIAGNOSIS — E119 Type 2 diabetes mellitus without complications: Secondary | ICD-10-CM | POA: Insufficient documentation

## 2014-07-09 DIAGNOSIS — F10929 Alcohol use, unspecified with intoxication, unspecified: Secondary | ICD-10-CM | POA: Diagnosis present

## 2014-07-09 DIAGNOSIS — Z789 Other specified health status: Secondary | ICD-10-CM

## 2014-07-09 DIAGNOSIS — Z720 Tobacco use: Secondary | ICD-10-CM | POA: Insufficient documentation

## 2014-07-09 DIAGNOSIS — I10 Essential (primary) hypertension: Secondary | ICD-10-CM | POA: Insufficient documentation

## 2014-07-09 DIAGNOSIS — Z7289 Other problems related to lifestyle: Secondary | ICD-10-CM

## 2014-07-09 LAB — CBG MONITORING, ED: GLUCOSE-CAPILLARY: 230 mg/dL — AB (ref 70–99)

## 2014-07-09 NOTE — ED Notes (Signed)
Pt not willing to stay any longer, escorted to waiting room by this RN, son is coming to pick up.

## 2014-07-09 NOTE — ED Notes (Signed)
Pt admits to a fifth and a half of alcohol. Uncooperative with staff, unwilling to give urine sample

## 2014-07-09 NOTE — ED Provider Notes (Signed)
The patient was not seen by myself or another provider.  The patient left without being seen after triage.    Eduardo CoKevin M Jai Bear, MD 07/09/14 [phone]
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
## 2014-10-06 ENCOUNTER — Emergency Department (HOSPITAL_COMMUNITY)
Admission: EM | Admit: 2014-10-06 | Discharge: 2014-10-07 | Disposition: A | Discharge status: Other Acute Inpatient Hospital | Payer: Medicaid Other | Attending: Emergency Medicine | Admitting: Emergency Medicine

## 2014-10-06 ENCOUNTER — Encounter (HOSPITAL_COMMUNITY): Payer: Self-pay | Admitting: Emergency Medicine

## 2014-10-06 DIAGNOSIS — Z9889 Other specified postprocedural states: Secondary | ICD-10-CM | POA: Diagnosis not present

## 2014-10-06 DIAGNOSIS — F319 Bipolar disorder, unspecified: Secondary | ICD-10-CM | POA: Diagnosis not present

## 2014-10-06 DIAGNOSIS — Z720 Tobacco use: Secondary | ICD-10-CM | POA: Insufficient documentation

## 2014-10-06 DIAGNOSIS — Z7951 Long term (current) use of inhaled steroids: Secondary | ICD-10-CM | POA: Diagnosis not present

## 2014-10-06 DIAGNOSIS — F10230 Alcohol dependence with withdrawal, uncomplicated: Secondary | ICD-10-CM | POA: Insufficient documentation

## 2014-10-06 DIAGNOSIS — Z79899 Other long term (current) drug therapy: Secondary | ICD-10-CM | POA: Diagnosis not present

## 2014-10-06 DIAGNOSIS — Z7982 Long term (current) use of aspirin: Secondary | ICD-10-CM | POA: Diagnosis not present

## 2014-10-06 DIAGNOSIS — R4589 Other symptoms and signs involving emotional state: Secondary | ICD-10-CM

## 2014-10-06 DIAGNOSIS — F1994 Other psychoactive substance use, unspecified with psychoactive substance-induced mood disorder: Secondary | ICD-10-CM | POA: Diagnosis not present

## 2014-10-06 DIAGNOSIS — R45850 Homicidal ideations: Secondary | ICD-10-CM

## 2014-10-06 DIAGNOSIS — Z8739 Personal history of other diseases of the musculoskeletal system and connective tissue: Secondary | ICD-10-CM | POA: Insufficient documentation

## 2014-10-06 DIAGNOSIS — R45851 Suicidal ideations: Secondary | ICD-10-CM | POA: Diagnosis present

## 2014-10-06 DIAGNOSIS — R4689 Other symptoms and signs involving appearance and behavior: Secondary | ICD-10-CM

## 2014-10-06 DIAGNOSIS — E119 Type 2 diabetes mellitus without complications: Secondary | ICD-10-CM | POA: Insufficient documentation

## 2014-10-06 DIAGNOSIS — I10 Essential (primary) hypertension: Secondary | ICD-10-CM | POA: Insufficient documentation

## 2014-10-06 DIAGNOSIS — F1410 Cocaine abuse, uncomplicated: Secondary | ICD-10-CM | POA: Insufficient documentation

## 2014-10-06 LAB — CBC
HEMATOCRIT: 44.7 % (ref 39.0–52.0)
Hemoglobin: 15.6 g/dL (ref 13.0–17.0)
MCH: 30 pg (ref 26.0–34.0)
MCHC: 34.9 g/dL (ref 30.0–36.0)
MCV: 86 fL (ref 78.0–100.0)
Platelets: 246 10*3/uL (ref 150–400)
RBC: 5.2 MIL/uL (ref 4.22–5.81)
RDW: 12.5 % (ref 11.5–15.5)
WBC: 7.2 10*3/uL (ref 4.0–10.5)

## 2014-10-06 LAB — CBG MONITORING, ED: Glucose-Capillary: 288 mg/dL — ABNORMAL HIGH (ref 65–99)

## 2014-10-06 LAB — COMPREHENSIVE METABOLIC PANEL
ALT: 35 U/L (ref 17–63)
AST: 37 U/L (ref 15–41)
Albumin: 4.4 g/dL (ref 3.5–5.0)
Alkaline Phosphatase: 73 U/L (ref 38–126)
Anion gap: 13 (ref 5–15)
BILIRUBIN TOTAL: 0.8 mg/dL (ref 0.3–1.2)
BUN: 15 mg/dL (ref 6–20)
CO2: 22 mmol/L (ref 22–32)
Calcium: 9.6 mg/dL (ref 8.9–10.3)
Chloride: 104 mmol/L (ref 101–111)
Creatinine, Ser: 1.1 mg/dL (ref 0.61–1.24)
GFR calc Af Amer: >60 mL/min (ref >60)
Glucose, Bld: 335 mg/dL — ABNORMAL HIGH (ref 65–99)
Potassium: 3.8 mmol/L (ref 3.5–5.1)
Sodium: 139 mmol/L (ref 135–145)
TOTAL PROTEIN: 8 g/dL (ref 6.5–8.1)

## 2014-10-06 LAB — ACETAMINOPHEN LEVEL: Acetaminophen (Tylenol), Serum: <10 ug/mL — ABNORMAL LOW (ref 10–30)

## 2014-10-06 LAB — ETHANOL: Alcohol, Ethyl (B): 227 mg/dL — ABNORMAL HIGH (ref <5)

## 2014-10-06 LAB — SALICYLATE LEVEL: Salicylate Lvl: <4 mg/dL (ref 2.8–30.0)

## 2014-10-06 MED ORDER — TRAZODONE HCL 50 MG PO TABS
50.0000 mg | ORAL_TABLET | Freq: Every day | ORAL | Status: DC
  Administered 2014-10-06: 50 mg via ORAL
  Filled 2014-10-06: qty 1

## 2014-10-06 MED ORDER — LORAZEPAM 1 MG PO TABS: 0.0000 mg | ORAL_TABLET | Freq: Two times a day (BID) | ORAL | Status: DC

## 2014-10-06 MED ORDER — VITAMIN B-1 100 MG PO TABS
100.0000 mg | ORAL_TABLET | Freq: Every day | ORAL | Status: DC
  Administered 2014-10-06 – 2014-10-07 (×2): 100 mg via ORAL
  Filled 2014-10-06 (×2): qty 1

## 2014-10-06 MED ORDER — FLUTICASONE PROPIONATE 50 MCG/ACT NA SUSP: 1.0000 | Freq: Every day | NASAL | Status: DC | PRN

## 2014-10-06 MED ORDER — LORAZEPAM 1 MG PO TABS
0.0000 mg | ORAL_TABLET | Freq: Four times a day (QID) | ORAL | Status: DC
  Administered 2014-10-07 (×2): 1 mg via ORAL
  Filled 2014-10-06 (×2): qty 1

## 2014-10-06 MED ORDER — IBUPROFEN 200 MG PO TABS
600.0000 mg | ORAL_TABLET | Freq: Three times a day (TID) | ORAL | Status: DC | PRN
  Administered 2014-10-07: 600 mg via ORAL
  Filled 2014-10-06 (×2): qty 3

## 2014-10-06 MED ORDER — ATORVASTATIN CALCIUM 40 MG PO TABS
40.0000 mg | ORAL_TABLET | Freq: Every day | ORAL | Status: DC
  Administered 2014-10-07: 40 mg via ORAL
  Filled 2014-10-06 (×2): qty 1

## 2014-10-06 MED ORDER — THIAMINE HCL 100 MG/ML IJ SOLN
100.0000 mg | Freq: Every day | INTRAMUSCULAR | Status: DC
Start: 2014-10-06 — End: 2014-10-07

## 2014-10-06 MED ORDER — BENZTROPINE MESYLATE 1 MG PO TABS
0.5000 mg | ORAL_TABLET | Freq: Two times a day (BID) | ORAL | Status: DC
  Administered 2014-10-06 – 2014-10-07 (×2): 0.5 mg via ORAL
  Filled 2014-10-06 (×2): qty 1

## 2014-10-06 MED ORDER — METFORMIN HCL 500 MG PO TABS
1000.0000 mg | ORAL_TABLET | Freq: Two times a day (BID) | ORAL | Status: DC
  Administered 2014-10-07 (×2): 1000 mg via ORAL
  Filled 2014-10-06 (×3): qty 2

## 2014-10-06 MED ORDER — ACETAMINOPHEN 325 MG PO TABS
650.0000 mg | ORAL_TABLET | ORAL | Status: DC | PRN
  Administered 2014-10-07: 650 mg via ORAL
  Filled 2014-10-06 (×2): qty 2

## 2014-10-06 MED ORDER — LISINOPRIL 40 MG PO TABS
40.0000 mg | ORAL_TABLET | Freq: Every day | ORAL | Status: DC
  Administered 2014-10-07: 40 mg via ORAL
  Filled 2014-10-06: qty 1

## 2014-10-06 MED ORDER — ONDANSETRON HCL 4 MG PO TABS
4.0000 mg | ORAL_TABLET | Freq: Three times a day (TID) | ORAL | Status: DC | PRN
Start: 2014-10-06 — End: 2014-10-07

## 2014-10-06 MED ORDER — ESCITALOPRAM OXALATE 10 MG PO TABS
20.0000 mg | ORAL_TABLET | Freq: Every day | ORAL | Status: DC
  Administered 2014-10-07: 20 mg via ORAL
  Filled 2014-10-06: qty 2

## 2014-10-06 MED ORDER — ALUM & MAG HYDROXIDE-SIMETH 200-200-20 MG/5ML PO SUSP: 30.0000 mL | ORAL | Status: DC | PRN

## 2014-10-06 MED ORDER — ASPIRIN 81 MG PO CHEW
81.0000 mg | CHEWABLE_TABLET | Freq: Every day | ORAL | Status: DC
  Administered 2014-10-07: 81 mg via ORAL
  Filled 2014-10-06: qty 1

## 2014-10-06 MED ORDER — HALOPERIDOL 1 MG PO TABS
1.0000 mg | ORAL_TABLET | Freq: Two times a day (BID) | ORAL | Status: DC
  Administered 2014-10-07 (×2): 1 mg via ORAL
  Filled 2014-10-06 (×2): qty 1

## 2014-10-06 MED ORDER — NICOTINE 21 MG/24HR TD PT24
21.0000 mg | MEDICATED_PATCH | Freq: Every day | TRANSDERMAL | Status: DC
  Administered 2014-10-06 – 2014-10-07 (×2): 21 mg via TRANSDERMAL
  Filled 2014-10-06 (×2): qty 1

## 2014-10-06 NOTE — ED Notes (Signed)
Pt continues to be extremely rude and belligerent with staff, pt becomes very angry when ask to get off floor and move to chair. [employer] and security at bedside once again.

## 2014-10-06 NOTE — ED Notes (Signed)
Eduardo French at bedside 

## 2014-10-06 NOTE — BH Assessment (Signed)
Tele Assessment Note   Eduardo French is an 54 y.o. male presenting to [facility] reporting suicidal and homicidal ideations. Pt stated "I have a lot of thoughts". "I am homicidal and suicidal". "My thoughts are more homicidal; like I am really going to f some people up". "I feel like setting some mf'ers on fire that I don't like'. "Beating the wheels off mf'ers that I don't like". "Push someone in the road and make it seem like a person end up in the road". "By the time the system figure out I kill them it will be too late". "Kill slow and live life".  Pt is endorsing SI and stated "it's simple I would take a bunch of my meds and dump them down my throat". "I could piss off a redneck that don't like niggers or kick the breaks off a red neck cop". When pt was asked about previous suicide attempts pt stated "review my chart". Pt is also endorsing HI; however he would not identify any individuals only stated "people that I don't like". Pt stated "I feel like setting some mf'ers on fire that I don't like'. "Beating the wheels off mf'ers that I don't like". "Push someone in the road and make it seem like a person end up in the road". "By the time the system figure out I kill them it will be too late". "Kill slow and live life". Pt also shared that "I hear you when you are not talking, like right now".  Pt is currently intoxicated. Pt's BAL is 227. Pt thought process is tangential and his mood is irritable. Pt is alert and oriented; however his speech is slow. Inpatient treatment is recommended.   Axis I: Bipolar, mixed and Alcohol Use Disorder, Moderate  Past Medical History:  Past Medical History  Diagnosis Date  . Hypertension   . Depression   . Diabetes mellitus without complication   . S/P cardiac catheterization, 09/14/12 with mild non obstructive CAD and EF of 55% 09/15/2012  . DJD (degenerative joint disease) of cervical spine     on disability  . Bipolar disorder     Past Surgical History   Procedure Laterality Date  . Back surgery    . Left heart catheterization with coronary angiogram N/A 09/14/2012    Procedure: LEFT HEART CATHETERIZATION WITH CORONARY ANGIOGRAM;  Surgeon: Thurmon FairMihai Croitoru, MD;  Location: [facility];  Service: Cardiovascular;  Laterality: N/A;    Family History:  Family History  Problem Relation Age of Onset  . Heart disease Brother     has had heart transplant  . Diabetes Mother   . Cancer Mother   . Hypertension Mother   . Cancer Brother   . Diabetes Brother   . Cancer Brother     Social History:  reports that he has been smoking Cigarettes.  He has a 6.25 pack-year smoking history. He has never used smokeless tobacco. He reports that he drinks about 1.2 oz of alcohol per week. He reports that he does not use illicit drugs.  Additional Social History:  Alcohol / Drug Use History of alcohol / drug use?: Yes Substance #1 Name of Substance 1: Alcohol  1 - Age of First Use: 5 1 - Amount (size/oz): varies  1 - Frequency: unknown  1 - Duration: ongoing  1 - Last Use / Amount: 10-06-14 "1/5 of liquor".   CIWA: CIWA-Ar BP: 148/79 mmHg Pulse Rate: 106 Nausea and Vomiting: no nausea and no vomiting Tactile Disturbances: none Tremor: no  tremor Auditory Disturbances: not present Paroxysmal Sweats: no sweat visible Visual Disturbances: not present Anxiety: mildly anxious Headache, Fullness in Head: none present Agitation: two Orientation and Clouding of Sensorium: oriented and can do serial additions CIWA-Ar Total: 3 COWS:    PATIENT STRENGTHS: (choose at least two) Average or above average intelligence Communication skills  Allergies:  Allergies  Allergen Reactions  . Citrus Rash    Home Medications:  (Not in a hospital admission)  OB/GYN Status:  No LMP for male patient.  General Assessment Data Location of Assessment: [facility] TTS Assessment: In system Is this a Tele or Face-to-Face Assessment?: Face-to-Face Is this an Initial  Assessment or a Re-assessment for this encounter?: Initial Assessment Marital status: Separated Living Arrangements:  (Homeless ) Can pt return to current living arrangement?: Yes Admission Status: Voluntary Is patient capable of signing voluntary admission?: Yes Referral Source: Self/Family/Friend Insurance type: [facility]     Crisis Care Plan Living Arrangements:  (Homeless ) Name of Psychiatrist: Transport planner  Name of Therapist: Transport planner   Education Status Is patient currently in school?: No Current Grade: NA Highest grade of school patient has completed: NA Name of school: NA Contact person: NA  Risk to self with the past 6 months Suicidal Ideation: Yes-Currently Present Has patient been a risk to self within the past 6 months prior to admission? : No Suicidal Intent: Yes-Currently Present Has patient had any suicidal intent within the past 6 months prior to admission? : No Is patient at risk for suicide?: Yes Suicidal Plan?: Yes-Currently Present Has patient had any suicidal plan within the past 6 months prior to admission? : No Specify Current Suicidal Plan: "It's simple I will take a bunch of my meds".  Access to Means: Yes Specify Access to Suicidal Means: Pt has access to his medication.  What has been your use of drugs/alcohol within the last 12 months?: Pt reported that he drinks alcohol. BAL is currently 227 Previous Attempts/Gestures:  (Unknown. Pt did not provide a response. ) How many times?:  (Unable to assess) Other Self Harm Risks: No other self harm risk identified at this time.  Triggers for Past Attempts: Unknown Intentional Self Injurious Behavior: None Family Suicide History: Unknown Recent stressful life event(s): Financial Problems (Homeless ) Persecutory voices/beliefs?: No Depression:  (Unable to assess. ) Substance abuse history and/or treatment for substance abuse?: Yes Suicide prevention information given to non-admitted patients: Not  applicable  Risk to Others within the past 6 months Homicidal Ideation: Yes-Currently Present Does patient have any lifetime risk of violence toward others beyond the six months prior to admission? : No Thoughts of Harm to Others: Yes-Currently Present Comment - Thoughts of Harm to Others: "Setting a mf'er on fire that I don't like".  Current Homicidal Intent: Yes-Currently Present Current Homicidal Plan: Yes-Currently Present Describe Current Homicidal Plan: "Setting a mf'er on fire that I don't like".  Access to Homicidal Means: Yes Describe Access to Homicidal Means: Lighter Identified Victim: "I'm not going to tell you".  History of harm to others?:  (Unable to assess. ) Assessment of Violence: On admission Violent Behavior Description: No violent behaviors observed.  Does patient have access to weapons?: No Criminal Charges Pending?: No Does patient have a court date: No Is patient on probation?: No  Psychosis Hallucinations: Auditory ("I can hear you even when you are not speaking".) Delusions: None noted  Mental Status Report Appearance/Hygiene: In scrubs Eye Contact: Good Motor Activity: Freedom of movement Speech: Logical/coherent, Slow Level of  Consciousness: Quiet/awake Mood: Irritable Affect: Appropriate to circumstance Anxiety Level: Minimal Thought Processes: Tangential Judgement: Impaired (BAL-227) Orientation: Unable to assess Obsessive Compulsive Thoughts/Behaviors: Minimal  Cognitive Functioning Concentration: Fair Memory: Recent Intact IQ: Average Insight: Fair Impulse Control: Fair Appetite: Fair Weight Loss: 0 Weight Gain: 0 Sleep: No Change ("I have never been able to sleep". ) Total Hours of Sleep:  (Unknown. ) Vegetative Symptoms: None  ADLScreening [facility]) Patient's cognitive ability adequate to safely complete daily activities?: Yes Patient able to express need for assistance with ADLs?: Yes Independently performs  ADLs?: Yes (appropriate for developmental age)  Prior Inpatient Therapy Prior Inpatient Therapy: Yes Prior Therapy Dates: 2014/2016 Prior Therapy Facilty/Provider(s): [facility] Reason for Treatment: Substance Abuse   Prior Outpatient Therapy Prior Outpatient Therapy: Yes Prior Therapy Dates: Current  Prior Therapy Facilty/Provider(s): [facility]  Reason for Treatment: Substance abuse  Does patient have an ACCT team?: Unknown Does patient have Intensive In-House Services?  : No Does patient have [facility] services? : Yes Does patient have P4CC services?: Unknown  ADL Screening (condition at time of admission) Patient's cognitive ability adequate to safely complete daily activities?: Yes Is the patient deaf or have difficulty hearing?: No Does the patient have difficulty seeing, even when wearing glasses/contacts?: No Patient able to express need for assistance with ADLs?: Yes Does the patient have difficulty dressing or bathing?: No Independently performs ADLs?: Yes (appropriate for developmental age)       Abuse/Neglect Assessment (Assessment to be complete while patient is alone) Physical Abuse:  (unable to assess. ) Verbal Abuse:  (unable to assess ) Sexual Abuse:  (unable to assess ) Exploitation of patient/patient's resources:  (unable to assess ) Self-Neglect:  (unable to assess)     Advance Directives (For Healthcare) Does patient have an advance directive?: No    Additional Information 1:1 In Past 12 Months?: No CIRT Risk: Yes Elopement Risk: No Does patient have medical clearance?: Yes     Disposition:  Disposition Initial Assessment Completed for this Encounter: Yes Disposition of Patient: Inpatient treatment program Type of inpatient treatment program: Adult  Nairi Oswald S 10/06/2014 11:46 PM

## 2014-10-06 NOTE — ED Provider Notes (Signed)
CSN: [id number]     Arrival date & time 10/06/14  1851 History  This chart was scribed for a non-physician practitioner, Raynaldo Opitz, PA-C working with Mirian Mo, MD by [place] Peace, ED Scribe. The patient was seen in [facility]. The patient's care was started at 8:40 PM.      Chief Complaint  Patient presents with  . Suicidal  . Homicidal      The history is provided by the patient. No language interpreter was used.    HPI Comments: Eduardo French is a 54 y.o. male who was dropped off by his son presents to the Emergency Department complaining of SI and HI over the past 2 days with plans in particular to hurt himself and "several other people" but will not go into detail about what they are. He denies any events in particular that brought on these thoughts. He reports he is currently on medication to address these symptoms but just recently ran out. He explains he hasn't taken his medication in the past 1.5 weeks. Pt states he is a heavy alcohol drinker, he drinks "as much as he can get his hands on" (Vodka- 2/5's per day). Pt is a drug user (weed and cocaine).  Pt refers to alcohol as his "baby", states he gets mad if people try to take his liquor from him.    Past Medical History  Diagnosis Date  . Hypertension   . Depression   . Diabetes mellitus without complication   . S/P cardiac catheterization, 09/14/12 with mild non obstructive CAD and EF of 55% 09/15/2012  . DJD (degenerative joint disease) of cervical spine     on disability  . Bipolar disorder    Past Surgical History  Procedure Laterality Date  . Back surgery    . Left heart catheterization with coronary angiogram N/A 09/14/2012    Procedure: LEFT HEART CATHETERIZATION WITH CORONARY ANGIOGRAM;  Surgeon: Thurmon Fair, MD;  Location: [facility];  Service: Cardiovascular;  Laterality: N/A;   Family History  Problem Relation Age of Onset  . Heart disease Brother     has had heart transplant  . Diabetes  Mother   . Cancer Mother   . Hypertension Mother   . Cancer Brother   . Diabetes Brother   . Cancer Brother    History  Substance Use Topics  . Smoking status: Current Every Day Smoker -- 0.25 packs/day for 25 years    Types: Cigarettes    Last Attempt to Quit: 10/11/2012  . Smokeless tobacco: Never Used  . Alcohol Use: 1.2 oz/week    2 Cans of beer per week    Review of Systems  Psychiatric/Behavioral: Positive for suicidal ideas.       Positive homicidal ideations.   All other systems reviewed and are negative.     Allergies  Citrus  Home Medications   Prior to Admission medications   Medication Sig Start Date End Date Taking? Authorizing Provider  acetaminophen (TYLENOL) 500 MG tablet Take 1 tablet (500 mg total) by mouth every 6 (six) hours as needed for mild pain or headache. 06/16/14  Yes Sanjuana Kava, NP  aspirin 81 MG tablet Take 1 tablet (81 mg total) by mouth daily. For heart health 06/16/14  Yes Sanjuana Kava, NP  atorvastatin (LIPITOR) 40 MG tablet Take 1 tablet (40 mg total) by mouth daily. For high cholesterol 06/16/14  Yes Sanjuana Kava, NP  benztropine (COGENTIN) 0.5 MG tablet Take 1 tablet (0.5 mg  total) by mouth 2 (two) times daily. For prevention of drug induced tremors 06/16/14  Yes Sanjuana KavaAgnes I Nwoko, NP  fluticasone (FLONASE) 50 MCG/ACT nasal spray Place 1 spray into both nostrils daily as needed for allergies or rhinitis.   Yes Historical Provider, MD  metFORMIN (GLUCOPHAGE) 1000 MG tablet Take 1 tablet (1,000 mg total) by mouth 2 (two) times daily with a meal. For high blood sugar control 06/16/14  Yes Sanjuana KavaAgnes I Nwoko, NP  escitalopram (LEXAPRO) 20 MG tablet Take 1 tablet (20 mg total) by mouth daily. For depression 06/16/14   Sanjuana KavaAgnes I Nwoko, NP  haloperidol (HALDOL) 1 MG tablet Take 1 tablet (1 mg total) by mouth 2 (two) times daily. For mood control 06/16/14   Sanjuana KavaAgnes I Nwoko, NP  lisinopril (PRINIVIL,ZESTRIL) 40 MG tablet Take 1 tablet (40 mg total) by mouth daily.  For high blood pressure 06/16/14   Sanjuana KavaAgnes I Nwoko, NP  traZODone (DESYREL) 50 MG tablet Take 1 tablet (50 mg total) by mouth at bedtime. For sleep 06/16/14   Sanjuana KavaAgnes I Nwoko, NP   BP 148/79 mmHg  Pulse 106  Temp(Src) 98.1 F (36.7 C) (Oral)  Resp 18  SpO2 91% Physical Exam  Constitutional: He is oriented to person, place, and time. He appears well-developed and well-nourished. No distress.  HENT:  Head: Normocephalic and atraumatic.  Eyes: Conjunctivae and EOM are normal.  Neck: Neck supple. No tracheal deviation present.  Cardiovascular: Normal rate.   Pulmonary/Chest: Effort normal. No respiratory distress.  Abdominal: Soft. He exhibits no distension. There is no tenderness. There is no rebound.  Musculoskeletal: Normal range of motion.  Neurological: He is alert and oriented to person, place, and time. Coordination normal.  Skin: Skin is warm and dry.  Psychiatric: He has a normal mood and affect.  Nursing note and vitals reviewed.   ED Course  Procedures (including critical care time) Labs Review Labs Reviewed  CBG MONITORING, ED - Abnormal; Notable for the following:    Glucose-Capillary 288 (*)    All other components within normal limits  CBC  ACETAMINOPHEN LEVEL  COMPREHENSIVE METABOLIC PANEL  ETHANOL  SALICYLATE LEVEL  URINE RAPID DRUG SCREEN (HOSP PERFORMED) NOT AT [facility]    Imaging Review No results found.   EKG Interpretation None     Medications - No data to display  8:46 PM- Treatment plan was discussed with patient who verbalizes understanding and agrees.   MDM   Final diagnoses:  Suicidal behavior  Homicidal behavior  Polysubstance abuse    Patient will have TTS evaluation.   I personally performed the services described in this documentation, which was scribed in my presence. The recorded information has been reviewed and is accurate.    Emilia BeckKaitlyn Valen Mascaro, PA-C 10/07/14 0558  Emilia BeckKaitlyn Jonuel Butterfield, PA-C 10/07/14 0559  Mirian MoMatthew Gentry,  MD 10/07/14 [phone]

## 2014-10-06 NOTE — ED Notes (Signed)
Pt changed into scrubs, wanded by security.  

## 2014-10-06 NOTE — ED Notes (Signed)
Pt found sleeping on floor again. Pt ask to change into scrubs.

## 2014-10-06 NOTE — ED Notes (Signed)
Pt states that he is suicidal and homicidal.  Will not disclose SI plan.  Homicidal against "some people".  Pt belligerent against staff.  States that he will cooperate.

## 2014-10-06 NOTE — ED Notes (Signed)
Pt given urinal and ask to provide urine specimen. When pt is asked about SI/HI states "I can't tell you who I'm homicidal about, that would be like calling the cops".

## 2014-10-06 NOTE — BH Assessment (Signed)
Assessment completed. Consulted Ijeoma Nwaeze, NP who recommended inpatient treatment.  

## 2014-10-07 ENCOUNTER — Inpatient Hospital Stay (HOSPITAL_COMMUNITY)
Admission: AD | Admit: 2014-10-07 | Discharge: 2014-10-12 | DRG: 897 | Disposition: A | Discharge status: Home / Self Care | Payer: Medicaid Other | Source: Intra-hospital | Attending: Psychiatry | Admitting: Psychiatry

## 2014-10-07 ENCOUNTER — Encounter (HOSPITAL_COMMUNITY): Payer: Self-pay | Admitting: *Deleted

## 2014-10-07 DIAGNOSIS — I10 Essential (primary) hypertension: Secondary | ICD-10-CM | POA: Diagnosis present

## 2014-10-07 DIAGNOSIS — F14120 Cocaine abuse with intoxication, uncomplicated: Secondary | ICD-10-CM | POA: Diagnosis present

## 2014-10-07 DIAGNOSIS — F10230 Alcohol dependence with withdrawal, uncomplicated: Principal | ICD-10-CM | POA: Diagnosis present

## 2014-10-07 DIAGNOSIS — F17210 Nicotine dependence, cigarettes, uncomplicated: Secondary | ICD-10-CM | POA: Diagnosis present

## 2014-10-07 DIAGNOSIS — R45851 Suicidal ideations: Secondary | ICD-10-CM

## 2014-10-07 DIAGNOSIS — F1994 Other psychoactive substance use, unspecified with psychoactive substance-induced mood disorder: Secondary | ICD-10-CM

## 2014-10-07 DIAGNOSIS — Z590 Homelessness: Secondary | ICD-10-CM | POA: Diagnosis not present

## 2014-10-07 DIAGNOSIS — E119 Type 2 diabetes mellitus without complications: Secondary | ICD-10-CM | POA: Diagnosis present

## 2014-10-07 DIAGNOSIS — F10129 Alcohol abuse with intoxication, unspecified: Secondary | ICD-10-CM | POA: Diagnosis present

## 2014-10-07 DIAGNOSIS — Y907 Blood alcohol level of 200-239 mg/100 ml: Secondary | ICD-10-CM | POA: Diagnosis present

## 2014-10-07 DIAGNOSIS — R45850 Homicidal ideations: Secondary | ICD-10-CM | POA: Diagnosis not present

## 2014-10-07 DIAGNOSIS — F1410 Cocaine abuse, uncomplicated: Secondary | ICD-10-CM

## 2014-10-07 HISTORY — DX: Other psychoactive substance use, unspecified with psychoactive substance-induced mood disorder: F19.94

## 2014-10-07 HISTORY — DX: Cocaine abuse, uncomplicated: F14.10

## 2014-10-07 LAB — CBG MONITORING, ED
GLUCOSE-CAPILLARY: 207 mg/dL — AB (ref 65–99)
GLUCOSE-CAPILLARY: 204 mg/dL — AB (ref 65–99)
Glucose-Capillary: 238 mg/dL — ABNORMAL HIGH (ref 65–99)

## 2014-10-07 LAB — RAPID URINE DRUG SCREEN, HOSP PERFORMED
Amphetamines: NOT DETECTED
Barbiturates: NOT DETECTED
Benzodiazepines: NOT DETECTED
Cocaine: POSITIVE — AB
OPIATES: NOT DETECTED
TETRAHYDROCANNABINOL: NOT DETECTED

## 2014-10-07 MED ORDER — PNEUMOCOCCAL VAC POLYVALENT 25 MCG/0.5ML IJ INJ
0.5000 mL | INJECTION | INTRAMUSCULAR | Status: AC
Start: 2014-10-08 — End: 2014-10-08
  Administered 2014-10-08: 0.5 mL via INTRAMUSCULAR

## 2014-10-07 MED ORDER — ALUM & MAG HYDROXIDE-SIMETH 200-200-20 MG/5ML PO SUSP: 30.0000 mL | ORAL | Status: DC | PRN

## 2014-10-07 MED ORDER — LISINOPRIL 20 MG PO TABS
40.0000 mg | ORAL_TABLET | Freq: Every day | ORAL | Status: DC
  Administered 2014-10-08: 40 mg via ORAL
  Filled 2014-10-07 (×2): qty 1
  Filled 2014-10-07 (×2): qty 2

## 2014-10-07 MED ORDER — LORAZEPAM 1 MG PO TABS: 0.0000 mg | ORAL_TABLET | Freq: Four times a day (QID) | ORAL | Status: DC

## 2014-10-07 MED ORDER — LOPERAMIDE HCL 2 MG PO CAPS: 2.0000 mg | ORAL_CAPSULE | ORAL | Status: AC | PRN

## 2014-10-07 MED ORDER — ASPIRIN 81 MG PO CHEW
81.0000 mg | CHEWABLE_TABLET | Freq: Every day | ORAL | Status: DC
  Administered 2014-10-08 – 2014-10-12 (×5): 81 mg via ORAL
  Filled 2014-10-07 (×8): qty 1

## 2014-10-07 MED ORDER — THIAMINE HCL 100 MG/ML IJ SOLN: 100.0000 mg | Freq: Every day | INTRAMUSCULAR | Status: DC

## 2014-10-07 MED ORDER — METFORMIN HCL 500 MG PO TABS
1000.0000 mg | ORAL_TABLET | Freq: Two times a day (BID) | ORAL | Status: DC
  Administered 2014-10-08 – 2014-10-12 (×10): 1000 mg via ORAL
  Filled 2014-10-07 (×13): qty 2

## 2014-10-07 MED ORDER — CHLORDIAZEPOXIDE HCL 25 MG PO CAPS: 25.0000 mg | ORAL_CAPSULE | Freq: Four times a day (QID) | ORAL | Status: AC | PRN

## 2014-10-07 MED ORDER — CHLORDIAZEPOXIDE HCL 25 MG PO CAPS
25.0000 mg | ORAL_CAPSULE | ORAL | Status: DC
  Administered 2014-10-10: 25 mg via ORAL
  Filled 2014-10-07: qty 1

## 2014-10-07 MED ORDER — ACETAMINOPHEN 325 MG PO TABS
650.0000 mg | ORAL_TABLET | Freq: Four times a day (QID) | ORAL | Status: DC | PRN
  Administered 2014-10-08 – 2014-10-10 (×2): 650 mg via ORAL
  Filled 2014-10-07 (×2): qty 2

## 2014-10-07 MED ORDER — CHLORDIAZEPOXIDE HCL 25 MG PO CAPS: 25.0000 mg | ORAL_CAPSULE | Freq: Every day | ORAL | Status: DC

## 2014-10-07 MED ORDER — ONDANSETRON HCL 4 MG PO TABS: 4.0000 mg | ORAL_TABLET | Freq: Three times a day (TID) | ORAL | Status: DC | PRN

## 2014-10-07 MED ORDER — MAGNESIUM HYDROXIDE 400 MG/5ML PO SUSP: 30.0000 mL | Freq: Every day | ORAL | Status: DC | PRN

## 2014-10-07 MED ORDER — CHLORDIAZEPOXIDE HCL 25 MG PO CAPS
25.0000 mg | ORAL_CAPSULE | Freq: Four times a day (QID) | ORAL | Status: AC
  Administered 2014-10-07 – 2014-10-08 (×3): 25 mg via ORAL
  Filled 2014-10-07 (×3): qty 1

## 2014-10-07 MED ORDER — BENZTROPINE MESYLATE 0.5 MG PO TABS
0.5000 mg | ORAL_TABLET | Freq: Two times a day (BID) | ORAL | Status: DC
  Administered 2014-10-07 – 2014-10-12 (×11): 0.5 mg via ORAL
  Filled 2014-10-07 (×16): qty 1

## 2014-10-07 MED ORDER — CHLORDIAZEPOXIDE HCL 25 MG PO CAPS
25.0000 mg | ORAL_CAPSULE | Freq: Three times a day (TID) | ORAL | Status: AC
  Administered 2014-10-09 (×3): 25 mg via ORAL
  Filled 2014-10-07 (×3): qty 1

## 2014-10-07 MED ORDER — ESCITALOPRAM OXALATE 20 MG PO TABS
20.0000 mg | ORAL_TABLET | Freq: Every day | ORAL | Status: DC
  Administered 2014-10-08 – 2014-10-12 (×5): 20 mg via ORAL
  Filled 2014-10-07 (×7): qty 1

## 2014-10-07 MED ORDER — FLUTICASONE PROPIONATE 50 MCG/ACT NA SUSP: 1.0000 | Freq: Every day | NASAL | Status: DC | PRN

## 2014-10-07 MED ORDER — TRAZODONE HCL 50 MG PO TABS
50.0000 mg | ORAL_TABLET | Freq: Every day | ORAL | Status: DC
  Administered 2014-10-07 – 2014-10-11 (×4): 50 mg via ORAL
  Filled 2014-10-07 (×10): qty 1

## 2014-10-07 MED ORDER — HYDROXYZINE HCL 25 MG PO TABS: 25.0000 mg | ORAL_TABLET | Freq: Four times a day (QID) | ORAL | Status: AC | PRN

## 2014-10-07 MED ORDER — VITAMIN B-1 100 MG PO TABS
100.0000 mg | ORAL_TABLET | Freq: Every day | ORAL | Status: DC
  Administered 2014-10-08 – 2014-10-12 (×5): 100 mg via ORAL
  Filled 2014-10-07 (×8): qty 1

## 2014-10-07 MED ORDER — ATORVASTATIN CALCIUM 40 MG PO TABS
40.0000 mg | ORAL_TABLET | Freq: Every day | ORAL | Status: DC
  Administered 2014-10-08 – 2014-10-12 (×5): 40 mg via ORAL
  Filled 2014-10-07 (×2): qty 1
  Filled 2014-10-07: qty 2
  Filled 2014-10-07 (×2): qty 1
  Filled 2014-10-07: qty 2
  Filled 2014-10-07 (×3): qty 1

## 2014-10-07 MED ORDER — ADULT MULTIVITAMIN W/MINERALS CH
1.0000 | ORAL_TABLET | Freq: Every day | ORAL | Status: DC
  Administered 2014-10-08 – 2014-10-12 (×5): 1 via ORAL
  Filled 2014-10-07 (×7): qty 1

## 2014-10-07 MED ORDER — IBUPROFEN 600 MG PO TABS
600.0000 mg | ORAL_TABLET | Freq: Three times a day (TID) | ORAL | Status: DC | PRN
  Administered 2014-10-07 – 2014-10-09 (×3): 600 mg via ORAL
  Filled 2014-10-07 (×3): qty 1

## 2014-10-07 MED ORDER — HALOPERIDOL 1 MG PO TABS
1.0000 mg | ORAL_TABLET | Freq: Two times a day (BID) | ORAL | Status: DC
  Administered 2014-10-07 – 2014-10-09 (×4): 1 mg via ORAL
  Filled 2014-10-07: qty 1
  Filled 2014-10-07: qty 2
  Filled 2014-10-07 (×7): qty 1

## 2014-10-07 MED ORDER — LORAZEPAM 1 MG PO TABS: 0.0000 mg | ORAL_TABLET | Freq: Two times a day (BID) | ORAL | Status: DC

## 2014-10-07 MED ORDER — NICOTINE 21 MG/24HR TD PT24
21.0000 mg | MEDICATED_PATCH | Freq: Every day | TRANSDERMAL | Status: DC
  Administered 2014-10-09 – 2014-10-12 (×4): 21 mg via TRANSDERMAL
  Filled 2014-10-07 (×7): qty 1
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
## 2014-10-07 NOTE — Progress Notes (Signed)
54 year old male pt admitted on voluntary basis. Pt reports that he is here because he is depressed, homicidal, hearing voices and for alcoholism. Pt reports that he would like to go to long term treatment facility when he gets discharged from here. Pt reports that he has not been taking his medications as prescribed and that he self medicates with alcohol because it makes the voices go away. Pt does report some passive SI thoughts but able to contract for safety on the unit. Pt was oriented to the unit and safety maintained.

## 2014-10-07 NOTE — Tx Team (Signed)
Initial Interdisciplinary Treatment Plan   PATIENT STRESSORS: Financial difficulties Health problems Medication change or noncompliance Substance abuse   PATIENT STRENGTHS: Ability for insight Average or above average intelligence Capable of independent living General fund of knowledge Motivation for treatment/growth   PROBLEM LIST: Problem List/Patient Goals Date to be addressed Date deferred Reason deferred Estimated date of resolution  "I'm an alcoholic" 10/07/14     Depression 10/07/14     Suicidal Ideation 10/07/14     A/V hallucinations 10/07/14                                    DISCHARGE CRITERIA:  Ability to meet basic life and health needs Improved stabilization in mood, thinking, and/or behavior Verbal commitment to aftercare and medication compliance Withdrawal symptoms are absent or subacute and managed without 24-hour nursing intervention  PRELIMINARY DISCHARGE PLAN: Attend aftercare/continuing care group Placement in alternative living arrangements  PATIENT/FAMIILY INVOLVEMENT: This treatment plan has been presented to and reviewed with the patient, Eduardo French, and/or family member, .  The patient and family have been given the opportunity to ask questions and make suggestions.  [name], [name] 10/07/2014, 10:35 PM

## 2014-10-07 NOTE — [facility] Counselor (Signed)
[facility] Assessment Progress Note  [facility] EndoKelly, [facility]. Pt has signed support paperwork. [facility] and originals given to nurse, Alcario Droughtanika. Patient can come over after 1930. Tanika aware of this.   Johny ShockSamantha M. Ladona Ridgelaylor, MS, [facility] Disposition Counselor

## 2014-10-07 NOTE — ED Notes (Signed)
Pt belongings placed in locker 30 

## 2014-10-08 DIAGNOSIS — F10129 Alcohol abuse with intoxication, unspecified: Secondary | ICD-10-CM | POA: Diagnosis present

## 2014-10-08 DIAGNOSIS — F14120 Cocaine abuse with intoxication, uncomplicated: Secondary | ICD-10-CM

## 2014-10-08 HISTORY — DX: Cocaine abuse with intoxication, uncomplicated: F14.120

## 2014-10-08 LAB — GLUCOSE, CAPILLARY
GLUCOSE-CAPILLARY: 241 mg/dL — AB (ref 65–99)
GLUCOSE-CAPILLARY: 218 mg/dL — AB (ref 65–99)
Glucose-Capillary: 227 mg/dL — ABNORMAL HIGH (ref 65–99)
Glucose-Capillary: 154 mg/dL — ABNORMAL HIGH (ref 65–99)

## 2014-10-08 MED ORDER — INSULIN ASPART 100 UNIT/ML ~~LOC~~ SOLN: 0.0000 [IU] | Freq: Every day | SUBCUTANEOUS | Status: DC

## 2014-10-08 MED ORDER — LISINOPRIL 20 MG PO TABS
20.0000 mg | ORAL_TABLET | Freq: Every day | ORAL | Status: DC
  Administered 2014-10-09 – 2014-10-12 (×3): 20 mg via ORAL
  Filled 2014-10-08 (×6): qty 1

## 2014-10-08 MED ORDER — INSULIN ASPART 100 UNIT/ML ~~LOC~~ SOLN
0.0000 [IU] | Freq: Three times a day (TID) | SUBCUTANEOUS | Status: DC
  Administered 2014-10-08 (×2): 5 [IU] via SUBCUTANEOUS
  Administered 2014-10-09: 2 [IU] via SUBCUTANEOUS
  Administered 2014-10-09: 5 [IU] via SUBCUTANEOUS
  Administered 2014-10-09: 8 [IU] via SUBCUTANEOUS
  Administered 2014-10-10: 5 [IU] via SUBCUTANEOUS
  Administered 2014-10-10 – 2014-10-11 (×5): 3 [IU] via SUBCUTANEOUS
  Administered 2014-10-12: 5 [IU] via SUBCUTANEOUS
  Administered 2014-10-12 (×2): 3 [IU] via SUBCUTANEOUS
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
## 2014-10-08 NOTE — Progress Notes (Signed)
Did not attend group 

## 2014-10-08 NOTE — [facility] Suicide Risk Assessment (Signed)
[facility] Admission Suicide Risk Assessment   Nursing information obtained from:    Demographic factors:    Current Mental Status:    Loss Factors:    Historical Factors:    Risk Reduction Factors:    Total Time spent with patient: 45 minutes Principal Problem: Alcohol abuse with intoxication Diagnosis:   Patient Active Problem List   Diagnosis Date Noted  . Alcohol dependence with withdrawal, uncomplicated [F10.230] 10/07/2014  . Cocaine abuse [F14.10] 10/07/2014  . Substance induced mood disorder [F19.94] 10/07/2014  . Cocaine use disorder, mild, abuse [F14.10]   . Alcohol use disorder, moderate, dependence [F10.20]   . Bipolar 1 disorder, depressed, severe [F31.4] 06/10/2014  . Obesity (BMI 30.0-34.9) [E66.9] 04/11/2013  . Alcohol dependence [F10.20] 11/06/2012  . Cocaine dependence [F14.20] 11/06/2012  . Paranoid schizophrenia, chronic condition [F20.0] 11/06/2012  . Alcohol abuse [F10.10] 11/05/2012  . S/P cardiac catheterization, 09/14/12 with mild non obstructive CAD and EF of 55% [Z98.89] 09/15/2012  . HTN (hypertension), benign [I10] 09/12/2012  . DM type 2 (diabetes mellitus, type 2) [E11.9] 09/12/2012  . BPH (benign prostatic hyperplasia) [N40.0] 09/12/2012  . Unstable angina, cardiac cath without obstruction [I20.0] 09/12/2012  . Smoker [Z72.0] 09/12/2012  . 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 Depression [F32.9] 09/12/2012  . Bipolar disorder [F31.9] 09/12/2012  . Dyslipidemia- (LDL 190 in 2011) [E78.5] 09/12/2012     Continued Clinical Symptoms:  Alcohol Use Disorder Identification Test Final Score (AUDIT): 29 The "Alcohol Use Disorders Identification Test", Guidelines for Use in Primary Care, Second Edition.  World Science writer [facility]). Score between 0-7:  no or low risk or alcohol related problems. Score between 8-15:  moderate risk of alcohol related problems. Score between 16-19:  high risk of alcohol related problems. Score 20 or  above:  warrants further diagnostic evaluation for alcohol dependence and treatment.   CLINICAL FACTORS:   Severe Anxiety and/or Agitation Depression:   Anhedonia Comorbid alcohol abuse/dependence Hopelessness Impulsivity Insomnia Recent sense of peace/wellbeing Severe Alcohol/Substance Abuse/Dependencies Unstable or Poor Therapeutic Relationship Previous Psychiatric Diagnoses and Treatments Medical Diagnoses and Treatments/Surgeries   Musculoskeletal: Strength & Muscle Tone: decreased Gait & Station: normal Patient leans: N/A  Psychiatric Specialty Exam: Physical Exam  ROS  Blood pressure 98/65, pulse 70, temperature 97.3 F (36.3 C), temperature source Oral, resp. rate 16, height 6' (1.829 m), weight 95.255 kg (210 lb).Body mass index is 28.47 kg/(m^2).  General Appearance: Disheveled and Guarded  Patent attorney::  Fair  Speech:  Clear and Coherent and Slow  Volume:  Decreased  Mood:  Anxious and Depressed  Affect:  Constricted and Depressed  Thought Process:  Coherent and Goal Directed  Orientation:  Full (Time, Place, and Person)  Thought Content:  Rumination  Suicidal Thoughts:  Yes.  without intent/plan  Homicidal Thoughts:  Yes.  without intent/plan  Memory:  Immediate;   Fair Recent;   Fair  Judgement:  Impaired  Insight:  Lacking  Psychomotor Activity:  Decreased  Concentration:  Fair  Recall:  Good  Fund of Knowledge:Good  Language: Good  Akathisia:  Negative  Handed:  Right  AIMS (if indicated):     Assets:  Communication Skills Desire for Improvement Leisure Time Resilience Social Support  Sleep:     Cognition: WNL  ADL's:  Intact     COGNITIVE FEATURES THAT CONTRIBUTE TO RISK:  Closed-mindedness, Loss of executive function, Polarized thinking and Thought constriction (tunnel vision)    SUICIDE RISK:   Moderate:  Frequent  suicidal ideation with limited intensity, and duration, some specificity in terms of plans, no associated intent, good  self-control, limited dysphoria/symptomatology, some risk factors present, and identifiable protective factors, including available and accessible social support.  PLAN OF CARE: Admit for crisis to ablation, safety monitoring and medication management of substance induced mood disorder and polysubstance abuse along with alcohol intoxication and cocaine intoxication.  Medical Decision Making:  Review of Psycho-Social Stressors (1), Review or order clinical lab tests (1), Established Problem, Worsening (2), Review of Medication Regimen & Side Effects (2) and Review of New Medication or Change in Dosage (2)  I certify that inpatient services furnished can reasonably be expected to improve the patient's condition.   Sherlyne Crownover,JANARDHAHA R. 10/08/2014, 3:59 PM

## 2014-10-08 NOTE — [facility] Counselor (Signed)
CSW attempted twice to complete PSA w patient - patient states he has a severe headache and did not want to talk w CSW at this time.  Will attempt later.  Santa GeneraAnne Cunningham, LCSW Clinical Social Worker

## 2014-10-08 NOTE — Progress Notes (Signed)
Writer introduced self to patient.  Patient oriented to the unit and medications administered. 
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
## 2014-10-08 NOTE — [facility] Group Notes (Signed)
[facility] Group Notes: (Clinical Social Work)   10/08/2014      Type of Therapy:  Group Therapy   Participation Level:  Did Not Attend despite MHT prompting   Ambrose MantleMareida Grossman-Orr, LCSW 10/08/2014, 3:45 PM

## 2014-10-08 NOTE — Progress Notes (Signed)
D: Pt present with flat affect and depressed mood. Pt denies suicidal/homicidal thoughts this morning. Pt rates depression 6/10. Pt denies withdrawal symptoms this morning. Pt denies AVH.  Pt minimal on approach and forwarded little information. Pt appears calm this morning and is cooperative. Pt compliant with taking meds and no adverse reactions to meds verbalized by pt.  A: Medications administered as ordered per MD. Verbal support given. Pt encouraged to attend groups. 15 minute checks performed for safety.  R: Pt receptive to treatment.

## 2014-10-09 DIAGNOSIS — F10129 Alcohol abuse with intoxication, unspecified: Secondary | ICD-10-CM

## 2014-10-09 DIAGNOSIS — R45850 Homicidal ideations: Secondary | ICD-10-CM

## 2014-10-09 DIAGNOSIS — R45851 Suicidal ideations: Secondary | ICD-10-CM

## 2014-10-09 LAB — GLUCOSE, CAPILLARY
GLUCOSE-CAPILLARY: 148 mg/dL — AB (ref 65–99)
GLUCOSE-CAPILLARY: 255 mg/dL — AB (ref 65–99)
GLUCOSE-CAPILLARY: 155 mg/dL — AB (ref 65–99)
Glucose-Capillary: 209 mg/dL — ABNORMAL HIGH (ref 65–99)

## 2014-10-09 MED ORDER — HALOPERIDOL 2 MG PO TABS
2.0000 mg | ORAL_TABLET | Freq: Every day | ORAL | Status: DC
  Administered 2014-10-10 – 2014-10-11 (×2): 2 mg via ORAL
  Filled 2014-10-09 (×4): qty 1

## 2014-10-09 MED ORDER — GABAPENTIN 100 MG PO CAPS
100.0000 mg | ORAL_CAPSULE | Freq: Three times a day (TID) | ORAL | Status: DC
  Administered 2014-10-09 – 2014-10-12 (×10): 100 mg via ORAL
  Filled 2014-10-09 (×15): qty 1

## 2014-10-09 MED ORDER — HALOPERIDOL 5 MG PO TABS: 5.0000 mg | ORAL_TABLET | Freq: Every day | ORAL | Status: DC

## 2014-10-09 NOTE — Progress Notes (Signed)
D- Patient is visible in the milieu interacting with his peers. Patient has been observed laughing and joking with staff throughout shift.  Patient endorsed passive SI at beginning of shift but currently denies SI. Denies HI and AVH.  Patient has c/o of shoulder and knee pain and is requesting a "stronger" medication besides Tylenol and Ibuprofen.  He states that in the past he has taken steroid shots and Oxycodone for pain. Patient reports that he has not taken steroids since Spring 2015 and has not taken Oxycodone in "about a year and a half".  On patient self inventory sheet, patient rates his depression an "8" and his feelings of hopelessness and anxiety a "7" with "10" being the worst. Patient's mood and affect became brighter throughout the day.   A- Support and encouragement provided.  Routine safety checks conducted every 15 minutes.  Patient informed to notify staff with problems or concerns. R- Patient contracts for safety at this time. Safety maintained on the unit.

## 2014-10-09 NOTE — [facility] Group Notes (Deleted)
[facility]   10/09/2014 11:57 AM    Participation Quality:  Appropraite  Mood/Affect:  Appropriate  Depression Rating:  8  Anxiety Rating:  8  Thoughts of Suicide:  No  Will you contract for safety?   NA  Current AVH:  No  Plan for Discharge/Comments:  Patient attended discharge planning group and actively participated in group. Patient advised of interest in a residential treatment program.  Suiicide prevention education reviewed and SPE document provided.   Transportation Means: Patient uses  transportation.   Supports:  Patient has a support system.   Jarrell Armond, [name]

## 2014-10-09 NOTE — Progress Notes (Signed)
D. Pt had been in room and in bed for much of the evening, did not attend evening group activity. Pt did speak about having a rough day, did speak about having a headache earlier in the day but feeling better now. Pt has appeared depressed, but seen interacting appropriately with fellow peers. A. Support and encouragement provided. R. Safety maintained, will continue to monitor.

## 2014-10-09 NOTE — Progress Notes (Signed)
Recreation Therapy Notes  Date: 06.06.16 Time: 9:30 am Location: [address] Group Room  Group Topic: Coping Skills  Goal Area(s) Addresses:  Patient will verbalize importance of using healthy stress management. Patient will identify positive emotions associated with healthy stress management.  Intervention: Stress Management  Activity: Healthy Relaxation Guided Imagery.  LRT introduced and educated patients on stress management technique of guided imagery.  Script was used to deliver the technique to patients.  Patients were asked to follow script read aloud by LRT to engage in practicing the stress management technique.   Education: Coping Skills, Discharge Planning.   Education Outcome: Acknowledges understanding/In group clarification offered/Needs additional education.   Clinical Observations/Feedback: Patient did not attend group.    Samayah Novinger, LRT/CTRS         Kyrra Prada A 10/09/2014 3:32 PM 
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
## 2014-10-09 NOTE — [facility] Group Notes (Signed)
[facility]   10/09/2014 12:01 PM    Participation Quality:  Appropraite  Mood/Affect:  Appropriate  Depression Rating:  8  Anxiety Rating:  8  Thoughts of Suicide:  No  Will you contract for safety?   NA  Current AVH:  No  Plan for Discharge/Comments:  Patient attended discharge planning group and actively participated in group. Patient [place] interested in residential treatment [place] from [place] area.  Suicide prevention education reviewed and SPE document provided.   Transportation Means: Patient uses public transportation.   Supports:  Patient has a support system.   Eduardo French, Eduardo French
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
## 2014-10-09 NOTE — Progress Notes (Signed)
Did not attend group tonight was asleep in bed.   [name] N Eduardo French 10/09/2014, 8:56 PM

## 2014-10-10 LAB — GLUCOSE, CAPILLARY
GLUCOSE-CAPILLARY: 190 mg/dL — AB (ref 65–99)
GLUCOSE-CAPILLARY: 236 mg/dL — AB (ref 65–99)
GLUCOSE-CAPILLARY: 199 mg/dL — AB (ref 65–99)
GLUCOSE-CAPILLARY: 178 mg/dL — AB (ref 65–99)

## 2014-10-10 NOTE — Clinical Social Work Note (Deleted)
CSW met with patient to complete PSA and discuss discharge planning. Patient agreed to referral for outpatient treatment with [facility]. CSW spoke with Ruby who advised patient has no showed on two occasions. She shared she will consult with Dr. Ross about rescheduling patient and call back. Patient declined to allow contact collateral contact.        

## 2014-10-10 NOTE — Progress Notes (Signed)
Recreation Therapy Notes  Animal-Assisted Activity (AAA) Program Checklist/Progress Notes Patient Eligibility Criteria Checklist & Daily Group note for Rec Tx Intervention  Date: 06.07.16 Time: 2:30pm [address]   AAA/T Program Assumption of Risk Form signed by Patient/ or Parent Legal Guardian yes  Patient is free of allergies or sever asthma yes  Patient reports no fear of animals yes  Patient reports no history of cruelty to animals yes  Patient understands his/her participation is voluntary yes  Patient washes hands before animal contact yes  Patient washes hands after animal contact yes  Behavioral Response: Engaged  Education: Charity fundraiserHand Washing, Appropriate Animal Interaction   Education Outcome: Acknowledges understanding/In group clarification offered/Needs additional education.   Clinical Observations/Feedback: Patient attended group and pet the dog.   Caroll RancherMarjette Andres Escandon, LRT/CTRS         Lillia AbedLindsay, Maebelle Sulton A 10/10/2014 4:00 PM

## 2014-10-10 NOTE — [facility] Group Notes (Signed)
[facility] LCSW Group Therapy      Feelings About Diagnosis 1:15 - 2:30 PM         10/10/2014    Type of Therapy:  Group Therapy  Participation Level:  Active  Participation Quality:  Appropriate  Affect:  Appropriate  Cognitive:  Alert and Appropriate  Insight:  Developing/Improving and Engaged  Engagement in Therapy:  Developing/Improving and Engaged  Modes of Intervention:  Discussion, Education, Exploration, Problem-Solving, Rapport Building, Support  Summary of Progress/Problems:  Patient actively participated in group. Patient discussed past and present diagnosis and the effects it has had on  life.  Patient talked about family and society being judgmental and the stigma associated with having a mental health diagnosis. Patient understands he has an illness that can not be cured just like his diabetes. Wynn BankerHodnett, Zigmond Trela Hairston 10/10/2014

## 2014-10-10 NOTE — Plan of Care (Signed)
Problem: Diagnosis: Increased Risk For Suicide Attempt Goal: STG-Patient Will Comply With Medication Regime Outcome: Progressing Patient is compliant with medication regimen at this time.      

## 2014-10-10 NOTE — Clinical Social Work Note (Signed)
Patient completed application for admission to [facility].  Information faxed as requested by patient.  [facility] admission they will need information on medications and discharge summary if patient accepted to their program.
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
## 2014-10-10 NOTE — Progress Notes (Signed)
Patient ID: Eduardo French, male   DOB: 1960/06/19, 54 y.o.   MRN: [id number]  DAR: Pt. Denies A/V Hallucinations. Patient reports SI and HI. Patient reports the thoughts are on and off and does not report any current plan. When asked who patient is having HI towards patient stated, "you don't know 'em." Patient reports he can contract for safety. Patient reports that his sleep is fair, appetite is fair, energy level normal, and concentration level is good. Patient rates his depression 6/10, hopelessness 3/10, and anxiety 5/10. Patient reported neck and back pain that are chronic and received Tylenol which provided some relief. Patient does not appear in any acute distress when observed walking down the hall. Support and encouragement provided to the patient. Scheduled medications administered to patient per physician's orders except Lisinopril due to patient having low B.P. MD Cobos was notified of this. Patient is receptive and cooperative. Patient is seen in the milieu at times. Q15 minute checks are maintained for safety.

## 2014-10-10 NOTE — [facility] Group Notes (Signed)
[facility] Group Notes: recovery  Date:  10/10/2014  Time:  5:02 PM  Type of Therapy:  Nurse Education  Participation Level:  Active  Participation Quality:  Appropriate  Affect:  Appropriate  Cognitive:  Appropriate  Insight:  Appropriate  Engagement in Group:  Engaged  Modes of Intervention:  Discussion  Summary of Progress/Problems:  Eduardo French, Eduardo French 10/10/2014, 5:02 PM

## 2014-10-10 NOTE — Progress Notes (Addendum)
D: Pt endorses SI with no plan. Pt verbally contracts for safety. Pt was asleep during group time. Pt was visible in the dayroom interacting with others upon waking up. Pt was interested in knowing of any medication changes for his pain. Pt was informed of the new order for Neurontin TID that was initiated this afternoon. Pt reported having pain of a level 4 out of 10 with 10 being the most severe. Pain locations included his lower back and right shoulder and knee.  A: Writer administered scheduled and prn medications to pt, per MD orders. Continued support and availability as needed was extended to this pt. Staff continue to monitor pt with q4515min checks.  R: No adverse drug reactions noted. Pt receptive to treatment. Pt remains safe at this time.

## 2014-10-10 NOTE — Progress Notes (Addendum)
Patient ID: Eduardo French, male   DOB: [birth year], 54 y.o.   MRN: [id number] [facility] MD Progress Note  10/10/2014 3:21 PM GARRIE WOODIN  MRN:  [id number] Subjective: He states he remains depressed, but acknowledges some improvement. He is more focused on disposition planning, discharge options, and states he is " working on getting myself better".  At this time not endorsing medication side effects, although he does state has felt dizzy, and antihypertensive medication was held due to decreased BP.  Objective : I have discussed case with with treatment team and have met with patient. Patient has remained depressed, somewhat dysphoric, and as discussed with staff, has continued to voice vague HI. I spoke with him about this. States he perceives he has been treated unfairly in the past, states" some people just have no respect, and it makes me angry" - however, denies any plan or intention of hurting anybody at this time , and states " I am thinking more of trying to get better and stay sober than in getting into any trouble ". He denies having homicidal ideations towards any specific person.  He is not endorsing any plan or intention of hurting himself at this time. He remains somewhat isolative, with limited interactions with peers . At present not presenting with any alcohol WDL symptoms- no tremors noted, no diaphoresis, no agitation or restlessness . BP 96/57- Lisinopril was held. Of note, ambulation has been WNL- patient denies any falls .  Principal Problem: Alcohol abuse with intoxication Diagnosis:   Patient Active Problem List   Diagnosis Date Noted  . Alcohol abuse with intoxication [F10.129] 10/08/2014  . Cocaine abuse with intoxication and without complication [O75.643] 32/95/1884  . Alcohol dependence with withdrawal, uncomplicated [Z66.063] 01/60/1093  . Cocaine abuse [F14.10] 10/07/2014  . Substance induced mood disorder [F19.94] 10/07/2014  . Cocaine use disorder, mild, abuse  [F14.10]   . Alcohol use disorder, moderate, dependence [F10.20]   . Bipolar 1 disorder, depressed, severe [F31.4] 06/10/2014  . Obesity (BMI 30.0-34.9) [E66.9] 04/11/2013  . Alcohol dependence [F10.20] 11/06/2012  . Cocaine dependence [F14.20] 11/06/2012  . Paranoid schizophrenia, chronic condition [F20.0] 11/06/2012  . Alcohol abuse [F10.10] 11/05/2012  . S/P cardiac catheterization, 09/14/12 with mild non obstructive CAD and EF of 55% [Z98.89] 09/15/2012  . HTN (hypertension), benign [I10] 09/12/2012  . DM type 2 (diabetes mellitus, type 2) [E11.9] 09/12/2012  . BPH (benign prostatic hyperplasia) [N40.0] 09/12/2012  . Unstable angina, cardiac cath without obstruction [I20.0] 09/12/2012  . Smoker [Z72.0] 09/12/2012  . 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 Depression [F32.9] 09/12/2012  . Bipolar disorder [F31.9] 09/12/2012  . Dyslipidemia- (LDL 190 in 2011) [E78.5] 09/12/2012   Total Time spent with patient: 25 minutes    Past Medical History:  Past Medical History  Diagnosis Date  . Hypertension   . Depression   . Diabetes mellitus without complication   . S/P cardiac catheterization, 09/14/12 with mild non obstructive CAD and EF of 55% 09/15/2012  . DJD (degenerative joint disease) of cervical spine     on disability  . Bipolar disorder     Past Surgical History  Procedure Laterality Date  . Back surgery    . Left heart catheterization with coronary angiogram N/A 09/14/2012    Procedure: LEFT HEART CATHETERIZATION WITH CORONARY ANGIOGRAM;  Surgeon: Sanda Klein, MD;  Location: [facility];  Service: Cardiovascular;  Laterality: N/A;   Family History:  Family History  Problem Relation Age of  Onset  . Heart disease Brother     has had heart transplant  . Diabetes Mother   . Cancer Mother   . Hypertension Mother   . Cancer Brother   . Diabetes Brother   . Cancer Brother    Social History:  History  Alcohol Use  . 1.2 oz/week  . 2 Cans of  beer per week     History  Drug Use  . Yes  . Special: Cocaine    History   Social History  . Marital Status: Legally Separated    Spouse Name: N/A  . Number of Children: N/A  . Years of Education: N/A   Social History Main Topics  . Smoking status: Current Every Day Smoker -- 1.00 packs/day for 25 years    Types: Cigarettes    Last Attempt to Quit: 10/11/2012  . Smokeless tobacco: Never Used  . Alcohol Use: 1.2 oz/week    2 Cans of beer per week  . Drug Use: Yes    Special: Cocaine  . Sexual Activity: Yes   Other Topics Concern  . None   Social History Narrative   Additional History:    Sleep: improved   Appetite:  Stable    Assessment:   Musculoskeletal: Strength & Muscle Tone: within normal limits- no significant tremors or diaphoresis Gait & Station: normal Patient leans: N/A   Psychiatric Specialty Exam: Physical Exam  ROS- no tremors, no diaphoresis, no nausea, vomiting reported,  Complains of chronic neck pain. Some dizziness endorsed today.  Blood pressure 96/57, pulse 70, temperature 97.7 F (36.5 C), temperature source Oral, resp. rate 16, height 6' (1.829 m), weight 210 lb (95.255 kg).Body mass index is 28.47 kg/(m^2).  General Appearance: Fairly Groomed  Engineer, water::  Good  Speech:  Normal Rate  Volume:  Decreased  Mood:   Less irritable, still presents depressed   Affect:  Constricted , sad, but briefly reactive   Thought Process:  Goal Directed and Linear  Orientation:  Full (Time, Place, and Person)  Thought Content:  denies hallucinations at present , no delusions . Does not appear internally preoccupied at this time.  Suicidal Thoughts: No at this time denies plan or intention of hurting self , contracts for safety on unit, describes vague HI, not currently geared towards any specific person  Homicidal Thoughts:  Has intermittently reported vague HI directed at " people who have no respect ", but has not mentioned specific persons and at  this time denies any actual plan or intention of violence   Memory:  recent and remote grossly intact   Judgement:  Fair  Insight:  Fair  Psychomotor Activity:  Normal- no restlessness or agitation  Concentration:  Good  Recall:  Good  Fund of Knowledge:Good  Language: Good  Akathisia:  Negative  Handed:  Right  AIMS (if indicated):     Assets:  Desire for Improvement Resilience  ADL's fair   Cognition: WNL  Sleep:  Number of Hours: 6.5     Current Medications: Current Facility-Administered Medications  Medication Dose Route Frequency Provider Last Rate Last Dose  . acetaminophen (TYLENOL) tablet 650 mg  650 mg Oral Q6H PRN Patrecia Pour, NP   650 mg at 10/10/14 0924  . alum & mag hydroxide-simeth (MAALOX/MYLANTA) 200-200-20 MG/5ML suspension 30 mL  30 mL Oral Q4H PRN Patrecia Pour, NP      . aspirin chewable tablet 81 mg  81 mg Oral Daily Patrecia Pour, NP  81 mg at 10/10/14 0919  . atorvastatin (LIPITOR) tablet 40 mg  40 mg Oral Daily Patrecia Pour, NP   40 mg at 10/09/14 1719  . benztropine (COGENTIN) tablet 0.5 mg  0.5 mg Oral BID Patrecia Pour, NP   0.5 mg at 10/10/14 6195  . chlordiazePOXIDE (LIBRIUM) capsule 25 mg  25 mg Oral Q6H PRN Dara Hoyer, PA-C      . chlordiazePOXIDE (LIBRIUM) capsule 25 mg  25 mg Oral BH-qamhs Dara Hoyer, PA-C   25 mg at 10/10/14 0919   Followed by  . [START ON 10/11/2014] chlordiazePOXIDE (LIBRIUM) capsule 25 mg  25 mg Oral Daily Dara Hoyer, PA-C      . escitalopram (LEXAPRO) tablet 20 mg  20 mg Oral Daily Patrecia Pour, NP   20 mg at 10/10/14 0919  . fluticasone (FLONASE) 50 MCG/ACT nasal spray 1 spray  1 spray Each Nare Daily PRN Patrecia Pour, NP      . gabapentin (NEURONTIN) capsule 100 mg  100 mg Oral TID Jenne Campus, MD   100 mg at 10/10/14 1212  . haloperidol (HALDOL) tablet 2 mg  2 mg Oral QHS Myer Peer Mara Favero, MD      . hydrOXYzine (ATARAX/VISTARIL) tablet 25 mg  25 mg Oral Q6H PRN Dara Hoyer, PA-C      .  ibuprofen (ADVIL,MOTRIN) tablet 600 mg  600 mg Oral Q8H PRN Patrecia Pour, NP   600 mg at 10/09/14 2149  . insulin aspart (novoLOG) injection 0-15 Units  0-15 Units Subcutaneous TID WC Niel Hummer, NP   5 Units at 10/10/14 1212  . insulin aspart (novoLOG) injection 0-5 Units  0-5 Units Subcutaneous QHS Niel Hummer, NP   0 Units at 10/08/14 2118  . lisinopril (PRINIVIL,ZESTRIL) tablet 20 mg  20 mg Oral Daily Niel Hummer, NP   20 mg at 10/09/14 0809  . loperamide (IMODIUM) capsule 2-4 mg  2-4 mg Oral PRN Dara Hoyer, PA-C      . magnesium hydroxide (MILK OF MAGNESIA) suspension 30 mL  30 mL Oral Daily PRN Patrecia Pour, NP      . metFORMIN (GLUCOPHAGE) tablet 1,000 mg  1,000 mg Oral BID WC Patrecia Pour, NP   1,000 mg at 10/10/14 0919  . multivitamin with minerals tablet 1 tablet  1 tablet Oral Daily Dara Hoyer, PA-C   1 tablet at 10/10/14 0920  . nicotine (NICODERM CQ - dosed in mg/24 hours) patch 21 mg  21 mg Transdermal Daily Patrecia Pour, NP   21 mg at 10/10/14 0920  . ondansetron (ZOFRAN) tablet 4 mg  4 mg Oral Q8H PRN Patrecia Pour, NP      . thiamine (VITAMIN B-1) tablet 100 mg  100 mg Oral Daily Patrecia Pour, NP   100 mg at 10/10/14 0932   Or  . thiamine (B-1) injection 100 mg  100 mg Intravenous Daily Patrecia Pour, NP      . traZODone (DESYREL) tablet 50 mg  50 mg Oral QHS Patrecia Pour, NP   50 mg at 10/09/14 2147    Lab Results:  Results for orders placed or performed during the hospital encounter of 10/07/14 (from the past 48 hour(s))  Glucose, capillary     Status: Abnormal   Collection Time: 10/08/14  5:15 PM  Result Value Ref Range   Glucose-Capillary 218 (H) 65 - 99 mg/dL   Comment  1 Notify RN    Comment 2 Document in Chart   Glucose, capillary     Status: Abnormal   Collection Time: 10/08/14  8:52 PM  Result Value Ref Range   Glucose-Capillary 154 (H) 65 - 99 mg/dL  Glucose, capillary     Status: Abnormal   Collection Time: 10/09/14  5:44 AM   Result Value Ref Range   Glucose-Capillary 209 (H) 65 - 99 mg/dL  Glucose, capillary     Status: Abnormal   Collection Time: 10/09/14 12:00 PM  Result Value Ref Range   Glucose-Capillary 148 (H) 65 - 99 mg/dL   Comment 1 Notify RN    Comment 2 Document in Chart   Glucose, capillary     Status: Abnormal   Collection Time: 10/09/14  5:07 PM  Result Value Ref Range   Glucose-Capillary 255 (H) 65 - 99 mg/dL   Comment 1 Notify RN    Comment 2 Document in Chart   Glucose, capillary     Status: Abnormal   Collection Time: 10/09/14  9:46 PM  Result Value Ref Range   Glucose-Capillary 155 (H) 65 - 99 mg/dL  Glucose, capillary     Status: Abnormal   Collection Time: 10/10/14  6:23 AM  Result Value Ref Range   Glucose-Capillary 190 (H) 65 - 99 mg/dL  Glucose, capillary     Status: Abnormal   Collection Time: 10/10/14 11:37 AM  Result Value Ref Range   Glucose-Capillary 236 (H) 65 - 99 mg/dL   Comment 1 Notify RN    Comment 2 Document in Chart     Physical Findings: AIMS: Facial and Oral Movements Muscles of Facial Expression: None, normal Lips and Perioral Area: None, normal Jaw: None, normal Tongue: None, normal,Extremity Movements Upper (arms, wrists, hands, fingers): None, normal Lower (legs, knees, ankles, toes): None, normal, Trunk Movements Neck, shoulders, hips: None, normal, Overall Severity Severity of abnormal movements (highest score from questions above): None, normal Incapacitation due to abnormal movements: None, normal Patient's awareness of abnormal movements (rate only patient's report): No Awareness, Dental Status Current problems with teeth and/or dentures?: No Does patient usually wear dentures?: No  CIWA:  CIWA-Ar Total: 1 COWS:      Assessment- patient remains depressed, sad, but affect somewhat reactive. He is less irritable today. He is not presenting with any severe or significant alcohol WDL symptoms at this time. He has continued to voice vague HI not  directed at anyone in particular and today denying any plan or intention of HI. His behavior has been calm on unit, and has tended to remains isolative. He is more future oriented, and expressing interest in going to a  Long term residential rehab program after discharge .   Treatment Plan Summary: Daily contact with patient to assess and evaluate symptoms and progress in treatment, Medication management, Plan continue inpatient treatment and medications as below  Discontinue standing  Librium detox protocol- rationale is that patient is not in any WDL and standing BZD may be contributing to low BP, dizziness . Will change to Librium PRN for potential WDL symptoms.  Continue Neurontin 100 mgrs TID for neck pain and for anxiety, titrate gradually. Continue Lexapro 20 mgrs QDAY for management of depression Continue Haldol   2 mgrs QHS for management of psychotic symptoms  Continue Cogentin 0.5 mgrs BID to minimize risk of EPS.  Patient is also on Glucophage and on Insulin for management of DM. He is also on Lipitor for management of hypercholesterolemia. CSW /  Team working on potential disposition plans- patient  Remains interested in going to Rehab. Monitor for HI- as noted currently denying plan or intention of violence and not identifying any specific person.  Request  Dietary consultation for dietary/nutrition education/counseling to address history of DM and dyslipidemia.  Medical Decision Making:  Established Problem, Stable/Improving (1), Review of Psycho-Social Stressors (1), Review or order clinical lab tests (1) and Review of Medication Regimen & Side Effects (2)     Brannon Decaire 10/10/2014, 3:21 PM

## 2014-10-11 LAB — GLUCOSE, CAPILLARY
GLUCOSE-CAPILLARY: 197 mg/dL — AB (ref 65–99)
GLUCOSE-CAPILLARY: 184 mg/dL — AB (ref 65–99)
GLUCOSE-CAPILLARY: 161 mg/dL — AB (ref 65–99)
Glucose-Capillary: 151 mg/dL — ABNORMAL HIGH (ref 65–99)

## 2014-10-11 NOTE — [facility] Group Notes (Signed)
[facility]  10/11/2014 3:36 PM  Type of Therapy:  Group Therapy  Participation Level:  Did Not Attend  Wynn BankerHodnett, Talasia Saulter Hairston 10/11/2014, 3:36 PM

## 2014-10-11 NOTE — Plan of Care (Signed)
Problem: Food- and Nutrition-Related Knowledge Deficit (NB-1.1) Goal: Nutrition education Formal process to instruct or train a patient/client in a skill or to impart knowledge to help patients/clients voluntarily manage or modify food choices and eating behavior to maintain or improve health. Outcome: Completed/Met Date Met:  10/11/14 Nutrition Education Note  RD consulted for nutrition education regarding elevated blood sugar and hx of hyperlipidemia.  Lipid Panel     Component Value Date/Time    CHOL 229* 06/11/2014 1922    TRIG 454* 06/11/2014 1922    HDL 45 06/11/2014 1922    CHOLHDL 5.1 06/11/2014 1922    VLDL UNABLE TO CALCULATE IF TRIGLYCERIDE OVER 400 mg/dL 06/11/2014 1922    LDLCALC UNABLE TO CALCULATE IF TRIGLYCERIDE OVER 400 mg/dL 06/11/2014 1922    RD provided "High Triglycerides Nutrition Therapy" handout from the Academy of Nutrition and Dietetics and "MyPlate" handout. Reviewed patient's dietary recall. Provided examples on ways to decrease sugar and fat intake in diet. Discouraged intake of processed foods and sugar-sweetened beverages. Encouraged fresh fruits and vegetables as well as whole grain sources of carbohydrates to maximize fiber intake.  Teach back method used.  Expect fair compliance if patient can abstain from alcohol.  Body mass index is 28.47 kg/(m^2). Pt meets criteria for overweight based on current BMI.  Diet Order: Diet Heart Room service appropriate?: Yes; Fluid consistency:: Thin Pt is also offered choice of unit snacks mid-morning and mid-afternoon.  Pt is eating as desired.    Labs and medications reviewed. No further nutrition interventions warranted at this time.  If additional nutrition issues arise, please re-consult RD.  Clayton Bibles, MS, RD, LDN Pager: [phone] After Hours Pager: [phone]

## 2014-10-11 NOTE — Progress Notes (Signed)
D: Pt denies SI/HI/AVH. Pt is pleasant and cooperative. Pt major concern is Arthritis  A: Pt was offered support and encouragement. Pt was given scheduled medications. Pt was encouraged to attend groups. Q 15 minute checks were done for safety. Pt given information on many forms of non-pharmacological help with pain . Pt encouraged to go on internet and research information, then discuss it with his doctor before he tries anything.   R:Pt attends groups and interacts well with peers and staff. Pt is taking medication. Pt has no complaints at this time .Pt receptive to treatment and safety maintained on unit.

## 2014-10-11 NOTE — Progress Notes (Signed)
Recreation Therapy Notes  Date: 06.08.16 Time: 9:30am Location: [address] Group room  Group Topic: Stress Management  Goal Area(s) Addresses:  Patient will verbalize importance of using healthy stress management.  Patient will identify positive emotions associated with healthy stress management.   Intervention: Stress Management  Activity :  Guided Imagery.  LRT introduced and educated patients on stress management technique of guided imagery.  A script was used to deliver the technique to patients.  Patients were asked to follow script read by LRT to engage in practicing the stress management technique.  Education:  Stress Management, Discharge Planning.   Education Outcome: Acknowledges edcuation/In group clarification offered/Needs additional education  Clinical Observations/Feedback: Patient did not attend group.   Letishia Elliott, LRT/CTRS         Eduardo French A 10/11/2014 2:28 PM 

## 2014-10-11 NOTE — [facility] Group Notes (Signed)
[facility] Dis[facility] Planning Group Note   10/11/2014 9:43 AM    Participation Quality:  Appropraite  Mood/Affect:  Appropriate  Depression Rating:  7  Anxiety Rating:  10  Thoughts of Suicide:  No  Will you contract for safety?   NA  Current AVH:  No  Plan for Discharge/Comments:  Patient attended discharge planning group and actively participated in group. He reports having a phone interview with [facility] and hopes he will be able to get into their program.  Suicide prevention education reviewed and SPE document provided.   Transportation Means: Patient has transportation.   Supports:  Patient has a support system.   [name], [name]

## 2014-10-11 NOTE — Progress Notes (Signed)
Patient ID: Eduardo GasserLeslie L Euceda, male   DOB: 1961-01-22, 54 y.o.   MRN: [id number]  DAR: Pt. Denies SI/HI and A/V Hallucinations to this writer but states he is able to contract for safety if he feels unsafe. Patient reports chronic back pain at 3/10 but refused intervention at this time. Patient encouraged to come to writer if his pain becomes too uncomfortable. Patient verbalized understanding. Patient reports that sleep last night was fair, energy level is low, appetite is fair, and concentration level is good. Patient rates depression 7/10, hopelessness 3/10, and anxiety 4/10. Support and encouragement provided to the patient. Scheduled medications administered to patient per physician's orders. Patient is receptive and cooperative. Patient is seen in the milieu interacting with staff and is attending groups.  Q15 minute checks are maintained for safety.

## 2014-10-11 NOTE — Progress Notes (Signed)
[facility] Group Notes:  (Nursing/MHT/Case Management/Adjunct)  Date:  10/11/2014  Time:  9:38 PM  Type of Therapy:  Psychoeducational Skills  Participation Level:  Active  Participation Quality:  Appropriate  Affect:  Appropriate  Cognitive:  Appropriate  Insight:  Appropriate  Engagement in Group:  Engaged  Modes of Intervention:  Discussion  Summary of Progress/Problems: Tonight in group Eduardo French said that today hasnt gone so well for him. He's trying to figure out what would work for him (personally) and mention that getting out of Eduardo French would be a good idea.  Eduardo French 10/11/2014, 9:38 PM

## 2014-10-11 NOTE — Plan of Care (Signed)
Problem: Ineffective individual coping Goal: STG: Patient will remain free from self harm Outcome: Progressing Pt safe on the unit  Problem: Alteration in mood Goal: LTG-Patient reports reduction in suicidal thoughts (Patient reports reduction in suicidal thoughts and is able to verbalize a safety plan for whenever patient is feeling suicidal)  Outcome: Progressing Pt denies SI at this time     

## 2014-10-11 NOTE — Clinical Social Work Note (Signed)
CSW advised by Onalee Huaavid at [facility] patient not accepted to there program due to being disabled.  He advised they are a work program which requires manual labor and has concerns patient would not be able to work as needed.
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
## 2014-10-11 NOTE — [facility] Suicide Risk Assessment (Addendum)
[facility] INPATIENT:  Family/Significant Other Suicide Prevention Education  Suicide Prevention Education:  Contact Attempts:  Earlyne IbaMike Niedermeier, Son, [phone];  has been identified by the patient as the family member/significant other with whom the patient will be residing, and identified as the person(s) who will aid the patient in the event of a mental health crisis.  With written consent from the patient, two attempts were made to provide suicide prevention education, prior to and/or following the patient's discharge.  We were unsuccessful in providing suicide prevention education.  A suicide education pamphlet was given to the patient to share with family/significant other.  Date and time of first attempt: [facility] as documented by CSW Hairston Date and time of second attempt:  June 9,2016 at 1:10 PM by Santa GeneraAnne Samone Guhl, LCSW  Hodnett, [name] 10/11/2014, 4:36 PM

## 2014-10-11 NOTE — Plan of Care (Signed)
Problem: Ineffective individual coping Goal: STG: Patient will participate in after care plan Patient will attend groups and engage in discussion. Outpatient follow up appointment will be scheduled.  Horace Porteous.Quylle Hodnett, LCSW 10/09/2014 12:03 PM  Outcome: Progressing Patient reports he is having a phone interview today at 1300 with an outpatient follow-up. Patient is participating.

## 2014-10-12 LAB — GLUCOSE, CAPILLARY
Glucose-Capillary: 177 mg/dL — ABNORMAL HIGH (ref 65–99)
Glucose-Capillary: 167 mg/dL — ABNORMAL HIGH (ref 65–99)
Glucose-Capillary: 170 mg/dL — ABNORMAL HIGH (ref 65–99)
Glucose-Capillary: 214 mg/dL — ABNORMAL HIGH (ref 65–99)

## 2014-10-12 MED ORDER — METFORMIN HCL 1000 MG PO TABS: 1000.0000 mg | ORAL_TABLET | Freq: Two times a day (BID) | ORAL | Status: DC

## 2014-10-12 MED ORDER — NEOMYCIN-POLYMYXIN-GRAMICIDIN 1.75-10000-.025 OP SOLN
2.0000 [drp] | Freq: Every day | OPHTHALMIC | Status: DC
  Administered 2014-10-12 (×2): 2 [drp] via OPHTHALMIC
  Filled 2014-10-12: qty 10

## 2014-10-12 MED ORDER — NICOTINE 21 MG/24HR TD PT24: 21.0000 mg | MEDICATED_PATCH | Freq: Every day | TRANSDERMAL | Status: DC

## 2014-10-12 MED ORDER — LISINOPRIL 20 MG PO TABS: 20.0000 mg | ORAL_TABLET | Freq: Every day | ORAL | Status: DC

## 2014-10-12 MED ORDER — NEOMYCIN-POLYMYXIN-GRAMICIDIN 1.75-10000-.025 OP SOLN
2.0000 [drp] | Freq: Every day | OPHTHALMIC | Status: DC
  Filled 2014-10-12: qty 10

## 2014-10-12 MED ORDER — POLYMYXIN B-TRIMETHOPRIM 10000-0.1 UNIT/ML-% OP SOLN: 2.0000 [drp] | OPHTHALMIC | Status: DC

## 2014-10-12 MED ORDER — GABAPENTIN 100 MG PO CAPS: 100.0000 mg | ORAL_CAPSULE | Freq: Three times a day (TID) | ORAL | Status: DC

## 2014-10-12 MED ORDER — ATORVASTATIN CALCIUM 40 MG PO TABS: 40.0000 mg | ORAL_TABLET | Freq: Every day | ORAL | Status: DC

## 2014-10-12 MED ORDER — ASPIRIN 81 MG PO TABS: 81.0000 mg | ORAL_TABLET | Freq: Every day | ORAL | Status: AC

## 2014-10-12 MED ORDER — BENZTROPINE MESYLATE 0.5 MG PO TABS: 0.5000 mg | ORAL_TABLET | Freq: Two times a day (BID) | ORAL | Status: DC

## 2014-10-12 MED ORDER — ESCITALOPRAM OXALATE 20 MG PO TABS: 20.0000 mg | ORAL_TABLET | Freq: Every day | ORAL | Status: DC

## 2014-10-12 MED ORDER — HALOPERIDOL 2 MG PO TABS: 2.0000 mg | ORAL_TABLET | Freq: Every day | ORAL | Status: DC

## 2014-10-12 MED ORDER — TRAZODONE HCL 50 MG PO TABS: 50.0000 mg | ORAL_TABLET | Freq: Every day | ORAL | Status: DC

## 2014-10-12 MED ORDER — FLUTICASONE PROPIONATE 50 MCG/ACT NA SUSP: 1.0000 | Freq: Every day | NASAL | Status: AC | PRN

## 2014-10-12 MED ORDER — ESCITALOPRAM OXALATE 20 MG PO TABS: 20.0000 mg | ORAL_TABLET | Freq: Every day | ORAL | Status: AC

## 2014-10-12 MED ORDER — POLYMYXIN B-TRIMETHOPRIM 10000-0.1 UNIT/ML-% OP SOLN: 1.0000 [drp] | OPHTHALMIC | Status: DC

## 2014-10-12 NOTE — Plan of Care (Signed)
Problem: Ineffective individual coping Goal: STG: Patient will remain free from self harm Outcome: Progressing Pt is currently denying any SI. Pt verbally contracts for safety.   Problem: Alteration in mood & ability to function due to Goal: LTG-Patient demonstrates decreased signs of withdrawal Goal not met. Patient has a CIWA of four on admission. CIWA will be at zero prior to discharge. Joette Catching, LCSW Clinical Social Worker [phone]  10/09/2014 12:04 PM     (Patient demonstrates decreased signs of withdrawal to the point the patient is safe to return home and continue treatment in an outpatient setting)  Outcome: Completed/Met Date Met:  10/12/14 No active withdrawal symptoms. Librium protocol completed.

## 2014-10-12 NOTE — Progress Notes (Signed)
Discharge note: Pt received both written and verbal discharge instructions. Pt agreed to f/u appt and med regimen. Pt verbalized understanding of discharge instructions. Pt denies SI/HI at time of discharge. Pt received belongings from room and locker. Pt received prescriptions and will return on 10/13/14 to pick up sample meds. Writer left a voicemail for Pharm D. Pt safely left [facility].

## 2014-10-12 NOTE — [facility] Suicide Risk Assessment (Signed)
[facility] Discharge Suicide Risk Assessment   Demographic Factors:  54 year old male,  Separated, currently unemployed   Total Time spent with patient: 30 minutes  Musculoskeletal: Strength & Muscle Tone: within normal limits Gait & Station: normal Patient leans: N/A  Psychiatric Specialty Exam: Physical Exam  ROS  Blood pressure 121/69, pulse 96, temperature 97.8 F (36.6 C), temperature source Oral, resp. rate 16, height 6' (1.829 m), weight 210 lb (95.255 kg).Body mass index is 28.47 kg/(m^2).  General Appearance: improved grooming  Eye Contact::  Good  Speech:  Normal Rate409  Volume:  Normal  Mood:  states he is feeling better, less depressed, affect is more reactive , fuller in range, not irritable at this time  Affect:  mildly constricted, but reactive   Thought Process:  Goal Directed and Linear  Orientation:  Full (Time, Place, and Person)  Thought Content:  denies hallucinations, no delusions   Suicidal Thoughts:  No- at this time patient denies any thoughts of hurting self or anyone else . Specifically denies any HI at this time, states " I am going to focus on getting myself better"   Homicidal Thoughts:  No  Memory:  recent and remote grossly intact   Judgement:  Other:  improved   Insight:  Present  Psychomotor Activity:  Normal  Concentration:  Good  Recall:  Good  Fund of Knowledge:Good  Language: Good  Akathisia:  Negative  Handed:  Right  AIMS (if indicated):     Assets:  Desire for Improvement Resilience  Sleep:  Number of Hours: 6.5  Cognition: WNL  ADL's: improved    Have you used any form of tobacco in the last 30 days? (Cigarettes, Smokeless Tobacco, Cigars, and/or Pipes): Yes  Has this patient used any form of tobacco in the last 30 days? (Cigarettes, Smokeless Tobacco, Cigars, and/or Pipes) Yes, A prescription for an FDA-approved tobacco cessation medication was offered at discharge and the patient refused  Mental Status Per Nursing Assessment::    On Admission:     Current Mental Status by Physician: At this time he is better than upon admission. Mood is improved, affect is improved and he is less irritable and dysphoric.  No thought disorder,  He is denying any suicidal ideations,he is denying any homicidal or violent ideations at this time, and as noted, has denied  HI directed towards a specific individual. No psychotic symptoms. At this time future oriented, wanting to go to a program in [place] after discharge - states " It will be a good change of environment for me".   Loss Factors: Limited support network, substance abuse, unemployment    Historical Factors: History of  Alcohol and Cocaine Abuse, history of Depression  Risk Reduction Factors:   Positive coping skills or problem solving skills  Continued Clinical Symptoms:  Remains slightly depressed, but overall much improved and now future oriented, no SI or HI, no psychotic symptoms, behavior on unit in good control. Interested and planning on relocating to [place]. Of note, presents with acute conjunctival erythema and itching on R eye. Sclerae injected .No exudate at this time but states that this AM his eye was " crusty" . Denies loss of vision, denies nausea, vomiting. Eye examined with NP and consistent with acute conjunctivitis will order ocular drops/Abx.  Cognitive Features That Contribute To Risk:  No gross cognitive deficits noted upon discharge. Is alert , attentive, and oriented x 3     Suicide Risk:  Mild:  Suicidal ideation of limited  frequency, intensity, duration, and specificity.  There are no identifiable plans, no associated intent, mild dysphoria and related symptoms, good self-control (both objective and subjective assessment), few other risk factors, and identifiable protective factors, including available and accessible social support.  Principal Problem: Alcohol abuse with intoxication Discharge Diagnoses:  Patient Active Problem List    Diagnosis Date Noted  . Alcohol abuse with intoxication [F10.129] 10/08/2014  . Cocaine abuse with intoxication and without complication [F14.120] 10/08/2014  . Alcohol dependence with withdrawal, uncomplicated [F10.230] 10/07/2014  . Cocaine abuse [F14.10] 10/07/2014  . Substance induced mood disorder [F19.94] 10/07/2014  . Cocaine use disorder, mild, abuse [F14.10]   . Alcohol use disorder, moderate, dependence [F10.20]   . Bipolar 1 disorder, depressed, severe [F31.4] 06/10/2014  . Obesity (BMI 30.0-34.9) [E66.9] 04/11/2013  . Alcohol dependence [F10.20] 11/06/2012  . Cocaine dependence [F14.20] 11/06/2012  . Paranoid schizophrenia, chronic condition [F20.0] 11/06/2012  . Alcohol abuse [F10.10] 11/05/2012  . S/P cardiac catheterization, 09/14/12 with mild non obstructive CAD and EF of 55% [Z98.89] 09/15/2012  . HTN (hypertension), benign [I10] 09/12/2012  . DM type 2 (diabetes mellitus, type 2) [E11.9] 09/12/2012  . BPH (benign prostatic hyperplasia) [N40.0] 09/12/2012  . Unstable angina, cardiac cath without obstruction [I20.0] 09/12/2012  . Smoker [Z72.0] 09/12/2012  . 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 Depression [F32.9] 09/12/2012  . Bipolar disorder [F31.9] 09/12/2012  . Dyslipidemia- (LDL 190 in 2011) [E78.5] 09/12/2012      Plan Of Care/Follow-up recommendations:  Activity:  as tolerated Diet:  heart healthy, diabetic diet  Tests:  NA Other:  See below  Is patient on multiple antipsychotic therapies at discharge:  No   Has Patient had three or more failed trials of antipsychotic monotherapy by history:  No  Recommended Plan for Multiple Antipsychotic Therapies: NA   Patient is planning on going to a Rehab Program In [place]. States he will be staying at a shelter there , until Rehab bed becomes available .    Kyandra Mcclaine 10/12/2014, 2:20 PM

## 2014-10-12 NOTE — [facility] Group Notes (Signed)
[facility] LCSW Group Therapy Group Therapy:  [facility]  1:15 - 2: 30 PM   10/12/2014   Type of Therapy: Group Therapy  Participation Level: Invited, did not attend.  Santa Genera, LCSW Clinical Social Worker 10/12/2014 2:00 PM
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
## 2014-10-12 NOTE — [facility] Notes (Addendum)
D: Pt presents flat in affect and depressed in mood. Pt's affect brightens upon interaction. Pt is currently negative for SI/AVH. Pt endorses HI towards individuals that have done him wrong. Pt did not specifically name any individuals. Pt is visible and active within the milieu.  A: Writer administered scheduled medications to pt, per MD orders. Continued support and availability as needed was extended to this pt. Staff continue to monitor pt with q75min checks.  R: No adverse drug reactions noted. Pt receptive to treatment. Pt remains safe at this time.   Pt plans on attending AA upon being discharged.

## 2014-10-12 NOTE — [facility] Notes (Addendum)
D: Pt presents with flat affect and anxious mood. Pt reports depression 5/10. Anxiety 3/10. Hopeless 5/10. Pt denies suicidal thoughts. Pt endorses passive HI thoughts towards anyone in the system who will not help him with his treatment. Pt seeking to get a ticket for the greyhound bus to get to [facility] for treatment. CSW and MD made aware. A: Medications administered as ordered per MD. Verbal support given. Pt encouraged to attend groups. 15 minute checks performed for safety.  R: Pt stated goal is to work on his life. Pt receptive to treatment.    Pt c/o itching to right eye. Right eye noted to be reddened. Gaspar Cola, NP., assessed pt and ordered eye drops and warm compress.

## 2014-10-14 NOTE — Discharge Summary (Signed)
Physician Discharge Summary Note  Patient:  Eduardo French is an 54 y.o., male MRN:  [id number] DOB:  11/05/1960 Patient phone:  [phone] (home)  Patient address:   [address] [place] [address],  Total Time spent with patient: 45 minutes  Date of Admission:  10/07/2014 Date of Discharge: 10/13/2014  Reason for Admission:  ETOH abuse  Principal Problem: Alcohol abuse with intoxication Discharge Diagnoses: Patient Active Problem List   Diagnosis Date Noted  . Alcohol abuse with intoxication [F10.129] 10/08/2014  . Cocaine abuse with intoxication and without complication [F14.120] 10/08/2014  . Alcohol dependence with withdrawal, uncomplicated [F10.230] 10/07/2014  . Cocaine abuse [F14.10] 10/07/2014  . Substance induced mood disorder [F19.94] 10/07/2014  . Cocaine use disorder, mild, abuse [F14.10]   . Alcohol use disorder, moderate, dependence [F10.20]   . Bipolar 1 disorder, depressed, severe [F31.4] 06/10/2014  . Obesity (BMI 30.0-34.9) [E66.9] 04/11/2013  . Alcohol dependence [F10.20] 11/06/2012  . Cocaine dependence [F14.20] 11/06/2012  . Paranoid schizophrenia, chronic condition [F20.0] 11/06/2012  . Alcohol abuse [F10.10] 11/05/2012  . S/P cardiac catheterization, 09/14/12 with mild non obstructive CAD and EF of 55% [Z98.89] 09/15/2012  . HTN (hypertension), benign [I10] 09/12/2012  . DM type 2 (diabetes mellitus, type 2) [E11.9] 09/12/2012  . BPH (benign prostatic hyperplasia) [N40.0] 09/12/2012  . Unstable angina, cardiac cath without obstruction [I20.0] 09/12/2012  . Smoker [Z72.0] 09/12/2012  . 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 Depression [F32.9] 09/12/2012  . Bipolar disorder [F31.9] 09/12/2012  . Dyslipidemia- (LDL 190 in 2011) [E78.5] 09/12/2012    Musculoskeletal: Strength & Muscle Tone: within normal limits Gait & Station: normal Patient leans: N/A  Psychiatric Specialty Exam:  SEE SRA Physical Exam   Vitals reviewed.   Review of Systems  Constitutional: Negative for fever.  Cardiovascular: Negative for chest pain.  Psychiatric/Behavioral: Negative for depression.  All other systems reviewed and are negative.   Blood pressure 121/69, pulse 96, temperature 97.8 F (36.6 C), temperature source Oral, resp. rate 16, height 6' (1.829 m), weight 95.255 kg (210 lb).Body mass index is 28.47 kg/(m^2).  Have you used any form of tobacco in the last 30 days? (Cigarettes, Smokeless Tobacco, Cigars, and/or Pipes): Yes  Has this patient used any form of tobacco in the last 30 days? (Cigarettes, Smokeless Tobacco, Cigars, and/or Pipes) Yes, Prescription not provided because: samples and Rx given  Past Medical History:  Past Medical History  Diagnosis Date  . Hypertension   . Depression   . Diabetes mellitus without complication   . S/P cardiac catheterization, 09/14/12 with mild non obstructive CAD and EF of 55% 09/15/2012  . DJD (degenerative joint disease) of cervical spine     on disability  . Bipolar disorder     Past Surgical History  Procedure Laterality Date  . Back surgery    . Left heart catheterization with coronary angiogram N/A 09/14/2012    Procedure: LEFT HEART CATHETERIZATION WITH CORONARY ANGIOGRAM;  Surgeon: Thurmon Fair, MD;  Location: [facility] CATH [facility];  Service: Cardiovascular;  Laterality: N/A;   Family History:  Family History  Problem Relation Age of Onset  . Heart disease Brother     has had heart transplant  . Diabetes Mother   . Cancer Mother   . Hypertension Mother   . Cancer Brother   . Diabetes Brother   . Cancer Brother    Social History:  History  Alcohol Use  . 1.2 oz/week  . 2 Cans  of beer per week     History  Drug Use  . Yes  . Special: Cocaine    History   Social History  . Marital Status: Legally Separated    Spouse Name: N/A  . Number of Children: N/A  . Years of Education: N/A   Social History Main Topics  . Smoking status: Current  Every Day Smoker -- 1.00 packs/day for 25 years    Types: Cigarettes    Last Attempt to Quit: 10/11/2012  . Smokeless tobacco: Never Used  . Alcohol Use: 1.2 oz/week    2 Cans of beer per week  . Drug Use: Yes    Special: Cocaine  . Sexual Activity: Yes   Other Topics Concern  . None   Social History Narrative    Past Psychiatric History: Hospitalizations:  Outpatient Care:  Substance Abuse Care:  Self-Mutilation:  Suicidal Attempts:  Violent Behaviors:   Risk to Self: Is patient at risk for suicide?: Yes Risk to Others:   Prior Inpatient Therapy:   Prior Outpatient Therapy:    Level of Care:  OP  Hospital Course:  Eduardo French is a 54 year old male patient who presented to the [facility] reporting suicidal and homicidal ideations. He endorsed a plan to overdose on medications and reports a history of past overdoses.   Patient also endorsed HI in the ED when he talked about setting people on fire and hurting random people.  Furthermore, he verbalized paranoia with hearning voices.  He states he is homeless and unable to keep track and be compliant with his meds.  The patient denied any drug use but his urine drug screen is positive for cocaine. His alcohol level on admission was 227.   Eduardo French was admitted for Alcohol abuse with intoxication and crisis management.  She was treated discharged with the medications listed below under Medication List.  Medical problems were identified and treated as needed.  Home medications were restarted as appropriate.  Improvement was monitored by observation and Eduardo French daily report of symptom reduction.  Emotional and mental status was monitored by daily self-inventory reports completed by Eduardo French and clinical staff.         Eduardo French was evaluated by the treatment team for stability and plans for continued recovery upon discharge.  Eduardo French motivation was an integral factor for scheduling further  treatment.  Employment, transportation, bed availability, health status, family support, and any pending legal issues were also considered during her hospital stay.  She was offered further treatment options upon discharge including but not limited to Residential, Intensive Outpatient, and Outpatient treatment.  Eduardo French will follow up with the services as listed below under Follow Up Information.     Upon completion of this admission the patient was both mentally and medically stable for discharge denying suicidal/homicidal ideation, auditory/visual/tactile hallucinations, delusional thoughts and paranoia.       Consults:  psychiatry  Significant Diagnostic Studies:  labs: per ED  Discharge Vitals:   Blood pressure 121/69, pulse 96, temperature 97.8 F (36.6 C), temperature source Oral, resp. rate 16, height 6' (1.829 m), weight 95.255 kg (210 lb). Body mass index is 28.47 kg/(m^2). Lab Results:   Results for orders placed or performed during the hospital encounter of 10/07/14 (from the past 72 hour(s))  Glucose, capillary     Status: Abnormal   Collection Time: 10/11/14  9:05 PM  Result Value Ref Range   Glucose-Capillary 177 (  H) 65 - 99 mg/dL  Glucose, capillary     Status: Abnormal   Collection Time: 10/12/14  6:25 AM  Result Value Ref Range   Glucose-Capillary 167 (H) 65 - 99 mg/dL  Glucose, capillary     Status: Abnormal   Collection Time: 10/12/14 12:07 PM  Result Value Ref Range   Glucose-Capillary 170 (H) 65 - 99 mg/dL  Glucose, capillary     Status: Abnormal   Collection Time: 10/12/14  4:37 PM  Result Value Ref Range   Glucose-Capillary 214 (H) 65 - 99 mg/dL    Physical Findings: AIMS: Facial and Oral Movements Muscles of Facial Expression: None, normal Lips and Perioral Area: None, normal Jaw: None, normal Tongue: None, normal,Extremity Movements Upper (arms, wrists, hands, fingers): None, normal Lower (legs, knees, ankles, toes): None, normal, Trunk  Movements Neck, shoulders, hips: None, normal, Overall Severity Severity of abnormal movements (highest score from questions above): None, normal Incapacitation due to abnormal movements: None, normal Patient's awareness of abnormal movements (rate only patient's report): No Awareness, Dental Status Current problems with teeth and/or dentures?: No Does patient usually wear dentures?: No  CIWA:  CIWA-Ar Total: 0 COWS:      See Psychiatric Specialty Exam and Suicide Risk Assessment completed by Attending Physician prior to discharge.  Discharge destination:  Home  Is patient on multiple antipsychotic therapies at discharge:  No   Has Patient had three or more failed trials of antipsychotic monotherapy by history:  No    Recommended Plan for Multiple Antipsychotic Therapies: NA     Medication List    TAKE these medications      Indication   aspirin 81 MG tablet  Take 1 tablet (81 mg total) by mouth daily. For heart health   Indication:  Heart health     atorvastatin 40 MG tablet  Commonly known as:  LIPITOR  Take 1 tablet (40 mg total) by mouth daily.   Indication:  Inherited Homozygous Hypercholesterolemia, Increased Fats, Triglycerides & Cholesterol in the Blood     benztropine 0.5 MG tablet  Commonly known as:  COGENTIN  Take 1 tablet (0.5 mg total) by mouth 2 (two) times daily.   Indication:  Extrapyramidal Reaction caused by Medications     escitalopram 20 MG tablet  Commonly known as:  LEXAPRO  Take 1 tablet (20 mg total) by mouth daily.   Indication:  Depression, Generalized Anxiety Disorder     fluticasone 50 MCG/ACT nasal spray  Commonly known as:  FLONASE  Place 1 spray into both nostrils daily as needed for allergies or rhinitis.   Indication:  Nonallergic Rhinitis, Hayfever     gabapentin 100 MG capsule  Commonly known as:  NEURONTIN  Take 1 capsule (100 mg total) by mouth 3 (three) times daily.   Indication:  Agitation, Neuropathic Pain      haloperidol 2 MG tablet  Commonly known as:  HALDOL  Take 1 tablet (2 mg total) by mouth at bedtime.   Indication:  Mood control     lisinopril 20 MG tablet  Commonly known as:  PRINIVIL,ZESTRIL  Take 1 tablet (20 mg total) by mouth daily.   Indication:  High Blood Pressure     metFORMIN 1000 MG tablet  Commonly known as:  GLUCOPHAGE  Take 1 tablet (1,000 mg total) by mouth 2 (two) times daily with a meal.   Indication:  Type 2 Diabetes     nicotine 21 mg/24hr patch  Commonly known as:  NICODERM CQ -  dosed in mg/24 hours  Place 1 patch (21 mg total) onto the skin daily.   Indication:  Nicotine Addiction     traZODone 50 MG tablet  Commonly known as:  DESYREL  Take 1 tablet (50 mg total) by mouth at bedtime.   Indication:  Trouble Sleeping           Follow-up Information    Go to [facility].   Why:  Got to Same Day Access clinic Monday - Friday from 8:30 AM - 2 PM for medications management and referrals   Contact information:   [address] [place]  [address] Phone:  [phone] Fax:  [phone]      Follow-up recommendations:  Activity:  as tol, diet as tol  Comments:  1.  Take all your medications as prescribed.              2.  Report any adverse side effects to outpatient provider.                       3.  Patient instructed to not use alcohol or illegal drugs while on prescription medicines.            4.  In the event of worsening symptoms, instructed patient to call 911, the crisis hotline or go to nearest emergency room for evaluation of symptoms.  Total Discharge Time:  30 min  Signed: Velna Hatchet May Agustin AGNP-BC 10/14/2014, 6:53 PM  I personally assessed the patient and formulated the plan Madie Reno A. Dub Mikes, M.D.

## 2014-10-16 NOTE — [facility] Notes (Signed)
  [facility] Adult Case Management Discharge Plan :  Will you be returning to the same living situation after discharge:  No. Patient states he will go to his son's house and then to [facility] where he plans to live in a shelter At discharge, do you have transportation home?: Yes,  son picking up from [facility], pastor has paid bus ticket to [place] per patient Do you have the ability to pay for your medications: Yes, has Medicaid  Release of information consent forms completed and in the chart;  Patient's signature needed at discharge.  Patient to Follow up at: Follow-up Information    Go to [facility].   Why:  Got to Same Day Access clinic Monday - Friday from 8:30 AM - 2 PM for medications management and referrals   Contact information:   [address] [place]  [address] Phone:  [phone] Fax:  [phone]      Patient denies SI/HI: Yes,  per MD assessment    Safety Planning and Suicide Prevention discussed: Yes,  SPE completed w son, discussed in CSW groups  Have you used any form of tobacco in the last 30 days? (Cigarettes, Smokeless Tobacco, Cigars, and/or Pipes): Yes  Has patient been referred to the Quitline?: Patient refused referral  Sallee Lange 10/16/2014, 10:09 AM  Late Entry

## 2014-12-08 ENCOUNTER — Encounter (HOSPITAL_COMMUNITY): Payer: Self-pay | Admitting: *Deleted

## 2014-12-08 ENCOUNTER — Emergency Department (HOSPITAL_COMMUNITY)
Admission: EM | Admit: 2014-12-08 | Discharge: 2014-12-08 | Disposition: A | Discharge status: Home / Self Care | Payer: Medicaid Other | Attending: Emergency Medicine | Admitting: Emergency Medicine

## 2014-12-08 DIAGNOSIS — Z79899 Other long term (current) drug therapy: Secondary | ICD-10-CM | POA: Diagnosis not present

## 2014-12-08 DIAGNOSIS — Z720 Tobacco use: Secondary | ICD-10-CM | POA: Diagnosis not present

## 2014-12-08 DIAGNOSIS — I10 Essential (primary) hypertension: Secondary | ICD-10-CM | POA: Insufficient documentation

## 2014-12-08 DIAGNOSIS — Z7982 Long term (current) use of aspirin: Secondary | ICD-10-CM | POA: Diagnosis not present

## 2014-12-08 DIAGNOSIS — F319 Bipolar disorder, unspecified: Secondary | ICD-10-CM | POA: Diagnosis not present

## 2014-12-08 DIAGNOSIS — Z8739 Personal history of other diseases of the musculoskeletal system and connective tissue: Secondary | ICD-10-CM | POA: Insufficient documentation

## 2014-12-08 DIAGNOSIS — Z7951 Long term (current) use of inhaled steroids: Secondary | ICD-10-CM | POA: Insufficient documentation

## 2014-12-08 DIAGNOSIS — E1165 Type 2 diabetes mellitus with hyperglycemia: Secondary | ICD-10-CM | POA: Diagnosis present

## 2014-12-08 DIAGNOSIS — Z9889 Other specified postprocedural states: Secondary | ICD-10-CM | POA: Insufficient documentation

## 2014-12-08 DIAGNOSIS — R1032 Left lower quadrant pain: Secondary | ICD-10-CM | POA: Insufficient documentation

## 2014-12-08 LAB — CBG MONITORING, ED
GLUCOSE-CAPILLARY: 282 mg/dL — AB (ref 65–99)
Glucose-Capillary: 359 mg/dL — ABNORMAL HIGH (ref 65–99)

## 2014-12-08 LAB — CBC WITH DIFFERENTIAL/PLATELET
BASOS ABS: 0 10*3/uL (ref 0.0–0.1)
Basophils Relative: 0 % (ref 0–1)
Eosinophils Absolute: 0 10*3/uL (ref 0.0–0.7)
Eosinophils Relative: 1 % (ref 0–5)
HEMATOCRIT: 39.3 % (ref 39.0–52.0)
Hemoglobin: 14.2 g/dL (ref 13.0–17.0)
LYMPHS ABS: 2.4 10*3/uL (ref 0.7–4.0)
LYMPHS PCT: 41 % (ref 12–46)
MCH: 30.8 pg (ref 26.0–34.0)
MCHC: 36.1 g/dL — AB (ref 30.0–36.0)
MCV: 85.2 fL (ref 78.0–100.0)
MONOS PCT: 6 % (ref 3–12)
Monocytes Absolute: 0.4 10*3/uL (ref 0.1–1.0)
NEUTROS PCT: 52 % (ref 43–77)
Neutro Abs: 3 10*3/uL (ref 1.7–7.7)
Platelets: 227 10*3/uL (ref 150–400)
RBC: 4.61 MIL/uL (ref 4.22–5.81)
RDW: 12.6 % (ref 11.5–15.5)
WBC: 5.8 10*3/uL (ref 4.0–10.5)

## 2014-12-08 LAB — URINALYSIS, ROUTINE W REFLEX MICROSCOPIC
BILIRUBIN URINE: NEGATIVE
Glucose, UA: >1000 mg/dL — AB
Hgb urine dipstick: NEGATIVE
Ketones, ur: NEGATIVE mg/dL
LEUKOCYTES UA: NEGATIVE
Nitrite: NEGATIVE
PH: 6 (ref 5.0–8.0)
Protein, ur: NEGATIVE mg/dL
Specific Gravity, Urine: 1.033 — ABNORMAL HIGH (ref 1.005–1.030)
Urobilinogen, UA: 0.2 mg/dL (ref 0.0–1.0)

## 2014-12-08 LAB — BASIC METABOLIC PANEL
ANION GAP: 6 (ref 5–15)
BUN: 11 mg/dL (ref 6–20)
CO2: 25 mmol/L (ref 22–32)
Calcium: 9.3 mg/dL (ref 8.9–10.3)
Chloride: 104 mmol/L (ref 101–111)
Creatinine, Ser: 1.16 mg/dL (ref 0.61–1.24)
GFR calc Af Amer: >60 mL/min (ref >60)
GFR calc non Af Amer: >60 mL/min (ref >60)
Glucose, Bld: 357 mg/dL — ABNORMAL HIGH (ref 65–99)
POTASSIUM: 4.1 mmol/L (ref 3.5–5.1)
Sodium: 135 mmol/L (ref 135–145)

## 2014-12-08 LAB — URINE MICROSCOPIC-ADD ON

## 2014-12-08 MED ORDER — SODIUM CHLORIDE 0.9 % IV BOLUS (SEPSIS)
1000.0000 mL | Freq: Once | INTRAVENOUS | Status: AC
  Administered 2014-12-08: 1000 mL via INTRAVENOUS

## 2014-12-08 MED ORDER — SODIUM CHLORIDE 0.9 % IV BOLUS (SEPSIS): 1000.0000 mL | Freq: Once | INTRAVENOUS | Status: DC

## 2014-12-08 MED ORDER — INSULIN ASPART 100 UNIT/ML ~~LOC~~ SOLN
10.0000 [IU] | Freq: Once | SUBCUTANEOUS | Status: AC
  Administered 2014-12-08: 10 [IU] via INTRAVENOUS
  Filled 2014-12-08: qty 1

## 2014-12-08 MED ORDER — ACETAMINOPHEN 500 MG PO TABS
1000.0000 mg | ORAL_TABLET | Freq: Once | ORAL | Status: AC
  Administered 2014-12-08: 1000 mg via ORAL
  Filled 2014-12-08: qty 2

## 2014-12-08 NOTE — Discharge Instructions (Signed)
Diabetes Mellitus and Food It is important for you to manage your blood sugar (glucose) level. Your blood glucose level can be greatly affected by what you eat. Eating healthier foods in the appropriate amounts throughout the day at about the same time each day will help you control your blood glucose level. It can also help slow or prevent worsening of your diabetes mellitus. Healthy eating may even help you improve the level of your blood pressure and reach or maintain a healthy weight.  HOW CAN FOOD AFFECT ME? Carbohydrates Carbohydrates affect your blood glucose level more than any other type of food. Your dietitian will help you determine how many carbohydrates to eat at each meal and teach you how to count carbohydrates. Counting carbohydrates is important to keep your blood glucose at a healthy level, especially if you are using insulin or taking certain medicines for diabetes mellitus. Alcohol Alcohol can cause sudden decreases in blood glucose (hypoglycemia), especially if you use insulin or take certain medicines for diabetes mellitus. Hypoglycemia can be a life-threatening condition. Symptoms of hypoglycemia (sleepiness, dizziness, and disorientation) are similar to symptoms of having too much alcohol.  If your health care provider has given you approval to drink alcohol, do so in moderation and use the following guidelines:  Women should not have more than one drink per day, and men should not have more than two drinks per day. One drink is equal to:  12 oz of beer.  5 oz of wine.  1 oz of hard liquor.  Do not drink on an empty stomach.  Keep yourself hydrated. Have water, diet soda, or unsweetened iced tea.  Regular soda, juice, and other mixers might contain a lot of carbohydrates and should be counted. WHAT FOODS ARE NOT RECOMMENDED? As you make food choices, it is important to remember that all foods are not the same. Some foods have fewer nutrients per serving than other  foods, even though they might have the same number of calories or carbohydrates. It is difficult to get your body what it needs when you eat foods with fewer nutrients. Examples of foods that you should avoid that are high in calories and carbohydrates but low in nutrients include:  Trans fats (most processed foods list trans fats on the Nutrition Facts label).  Regular soda.  Juice.  Candy.  Sweets, such as cake, pie, doughnuts, and cookies.  Fried foods. WHAT FOODS CAN I EAT? Have nutrient-rich foods, which will nourish your body and keep you healthy. The food you should eat also will depend on several factors, including:  The calories you need.  The medicines you take.  Your weight.  Your blood glucose level.  Your blood pressure level.  Your cholesterol level. You also should eat a variety of foods, including:  Protein, such as meat, poultry, fish, tofu, nuts, and seeds (lean animal proteins are best).  Fruits.  Vegetables.  Dairy products, such as milk, cheese, and yogurt (low fat is best).  Breads, grains, pasta, cereal, rice, and beans.  Fats such as olive oil, trans fat-free margarine, canola oil, avocado, and olives. DOES EVERYONE WITH DIABETES MELLITUS HAVE THE SAME MEAL PLAN? Because every person with diabetes mellitus is different, there is not one meal plan that works for everyone. It is very important that you meet with a dietitian who will help you create a meal plan that is just right for you. Document Released: 01/16/2005 Document Revised: 04/26/2013 Document Reviewed: 03/18/2013 ExitCare Patient Information 2015 [employer]. This   information is not intended to replace advice given to you by your health care provider. Make sure you discuss any questions you have with your health care provider.  

## 2014-12-08 NOTE — Progress Notes (Signed)
ED CM reviewed pt EPIC chart information to note nursing note indicating this medicaid of [place] pt was" sent here today by PCP due to elevated blood sugar. Pt also states that he has a headache and left eye feels blurry.   He is out of Metformin. He has been out of his blood pressure medication for more than two weeks (Lisinipril)."  Cm notes this pt is a medicaid of [place] pt who receives discounts on all medications with a cost $3 or less.  Cm spoke with the pt who reports he was only having difficulty getting medication when he did not have a way of getting a rx because before he starting seeing Gwinda Passe at [facility] [place] [facility] ([facility]) he "was bouncing back and forward to doctors who I feel did not treat me well so my prescription ran out"  Pt reports he did have funds to get the medication and confirmed with CM that his medicines generally do not cost more than $3 and [facility] [place] [facility] and his [facility] provider have pharmacies on campus to assist him Cm did offer him a list of financial assistance programs/resources in [place] for future needs.   Cm confirmed with him that he is compliant with f/u with [facility] providers  [facility] for medications Cm reviewed and shared www.goodrx.com for comparison on how pharmacy prices can change for the same medication Pt voiced understanding and appreciation of resources offered  Pt voiced interest in a pain management clinic and a pharmacy to deliver pain medications Pt was referred back to the NP and MD at [facility] at [facility] for this Pt reports "need to have surgery on my right hip and shoulder. I'm always in pain."  CM made pt aware that EDPs do not generally assist with pain management referrals  Pt mentioned he previously had been seen also at [facility] but left the practice   Pt is not having difficulty at this time with getting his medications and has a pcp he  prefers

## 2014-12-08 NOTE — ED Provider Notes (Signed)
CSN: [id number]     Arrival date & time 12/08/14  1224 History   First MD Initiated Contact with Patient 12/08/14 1247     Chief Complaint  Patient presents with  . Hyperglycemia     (Consider location/radiation/quality/duration/timing/severity/associated sxs/prior Treatment) Patient is a 54 y.o. male presenting with hyperglycemia. The history is provided by the patient.  Hyperglycemia Blood sugar level PTA:  400+ Severity:  Moderate Onset quality:  Gradual Timing:  Constant Progression:  Unchanged Chronicity:  New Diabetes status:  Controlled with oral medications Current diabetic therapy:  Out of medications Context: noncompliance   Relieved by:  Nothing Ineffective treatments:  None tried Associated symptoms: abdominal pain (LLQ and left flank pain), dehydration, increased thirst and polyuria   Associated symptoms: no fever     Past Medical History  Diagnosis Date  . Hypertension   . Depression   . Diabetes mellitus without complication   . S/P cardiac catheterization, 09/14/12 with mild non obstructive CAD and EF of 55% 09/15/2012  . DJD (degenerative joint disease) of cervical spine     on disability  . Bipolar disorder    Past Surgical History  Procedure Laterality Date  . Back surgery    . Left heart catheterization with coronary angiogram N/A 09/14/2012    Procedure: LEFT HEART CATHETERIZATION WITH CORONARY ANGIOGRAM;  Surgeon: Thurmon Fair, MD;  Location: [facility] CATH [facility];  Service: Cardiovascular;  Laterality: N/A;   Family History  Problem Relation Age of Onset  . Heart disease Brother     has had heart transplant  . Diabetes Mother   . Cancer Mother   . Hypertension Mother   . Cancer Brother   . Diabetes Brother   . Cancer Brother    History  Substance Use Topics  . Smoking status: Current Every Day Smoker -- 1.00 packs/day for 25 years    Types: Cigarettes    Last Attempt to Quit: 10/11/2012  . Smokeless tobacco: Never Used  . Alcohol Use: 1.2 oz/week     2 Cans of beer per week    Review of Systems  Constitutional: Negative for fever.  Gastrointestinal: Positive for abdominal pain (LLQ and left flank pain).  Endocrine: Positive for polydipsia and polyuria.  All other systems reviewed and are negative.     Allergies  Citrus  Home Medications   Prior to Admission medications   Medication Sig Start Date End Date Taking? Authorizing Provider  aspirin 81 MG tablet Take 1 tablet (81 mg total) by mouth daily. For heart health 10/12/14  Yes Adonis Brook, NP  escitalopram (LEXAPRO) 20 MG tablet Take 1 tablet (20 mg total) by mouth daily. 10/12/14  Yes Shuvon B Rankin, NP  fluticasone (FLONASE) 50 MCG/ACT nasal spray Place 1 spray into both nostrils daily as needed for allergies or rhinitis. 10/12/14  Yes Adonis Brook, NP  haloperidol (HALDOL) 2 MG tablet Take 1 tablet (2 mg total) by mouth at bedtime. Patient taking differently: Take 2 mg by mouth 2 (two) times daily.  10/12/14  Yes Shuvon B Rankin, NP  lisinopril (PRINIVIL,ZESTRIL) 20 MG tablet Take 1 tablet (20 mg total) by mouth daily. 10/12/14  Yes Shuvon B Rankin, NP  metFORMIN (GLUCOPHAGE) 1000 MG tablet Take 1 tablet (1,000 mg total) by mouth 2 (two) times daily with a meal. 10/12/14  Yes Shuvon B Rankin, NP  atorvastatin (LIPITOR) 40 MG tablet Take 1 tablet (40 mg total) by mouth daily. Patient not taking: Reported on 12/08/2014 10/12/14   Shuvon B  Rankin, NP  benztropine (COGENTIN) 0.5 MG tablet Take 1 tablet (0.5 mg total) by mouth 2 (two) times daily. Patient not taking: Reported on 12/08/2014 10/12/14   Shuvon B Rankin, NP  gabapentin (NEURONTIN) 100 MG capsule Take 1 capsule (100 mg total) by mouth 3 (three) times daily. Patient not taking: Reported on 12/08/2014 10/12/14   Shuvon B Rankin, NP  nicotine (NICODERM CQ - DOSED IN MG/24 HOURS) 21 mg/24hr patch Place 1 patch (21 mg total) onto the skin daily. Patient not taking: Reported on 12/08/2014 10/12/14   Shuvon B Rankin, NP  traZODone (DESYREL)  50 MG tablet Take 1 tablet (50 mg total) by mouth at bedtime. Patient not taking: Reported on 12/08/2014 10/12/14   Shuvon B Rankin, NP   BP 139/80 mmHg  Pulse 84  Temp(Src) 98.7 F (37.1 C) (Oral)  Resp 16  SpO2 95% Physical Exam  Constitutional: He is oriented to person, place, and time. He appears well-developed and well-nourished. No distress.  HENT:  Head: Normocephalic and atraumatic.  Eyes: Conjunctivae are normal.  Neck: Neck supple. No tracheal deviation present.  Cardiovascular: Normal rate, regular rhythm and normal heart sounds.   Pulmonary/Chest: Effort normal and breath sounds normal. No respiratory distress.  Abdominal: Soft. Bowel sounds are normal. He exhibits no distension. There is no tenderness. There is no rebound and no guarding.  Neurological: He is alert and oriented to person, place, and time.  Skin: Skin is warm and dry.  Psychiatric: He has a normal mood and affect.    ED Course  Procedures (including critical care time) Labs Review Labs Reviewed  BASIC METABOLIC PANEL - Abnormal; Notable for the following:    Glucose, Bld 357 (*)    All other components within normal limits  URINALYSIS, ROUTINE W REFLEX MICROSCOPIC (NOT AT [facility]) - Abnormal; Notable for the following:    Specific Gravity, Urine 1.033 (*)    Glucose, UA >1000 (*)    All other components within normal limits  CBC WITH DIFFERENTIAL/PLATELET - Abnormal; Notable for the following:    MCHC 36.1 (*)    All other components within normal limits  CBG MONITORING, ED - Abnormal; Notable for the following:    Glucose-Capillary 359 (*)    All other components within normal limits  CBG MONITORING, ED - Abnormal; Notable for the following:    Glucose-Capillary 282 (*)    All other components within normal limits  URINE MICROSCOPIC-ADD ON    Imaging Review No results found.   EKG Interpretation None      MDM   Final diagnoses:  Type 2 diabetes mellitus with hyperglycemia     54 year old male presents with feeling poorly, increased urine output, headache and blurry vision with hyperglycemia from his primary care physician's office. Insulin and IV fluid began to bring his blood sugar down, he was given a second bolus here and was feeling better. He needs to restart his home medications for diabetes and maintaining compliance, his primary care physician can consider starting insulin on an outpatient basis as indicated depending on response to oral therapy.    Lyndal Pulley, MD 12/08/14 [phone]

## 2014-12-08 NOTE — ED Notes (Signed)
Pt sent here today by PCP due to elevated blood sugar.  Pt also states that he has a headache and left eye feels blurry.   In addition, the pt states that his worse pain is in his left side.  He has been urinating a lot over the last 24 hours.  He denies ever having kidney stones.    He is out of Metformin.  He has been out of his blood pressure medication for more than two weeks (Lisinipril).    Pt is requesting food. I explained to him that we will have doctor do his assessment and decide on plan of care before we address his hunger issues.

## 2014-12-08 NOTE — ED Notes (Signed)
Pt sent here today by PCP due to elevated blood sugar

## 2015-06-12 ENCOUNTER — Emergency Department (HOSPITAL_COMMUNITY)
Admission: EM | Admit: 2015-06-12 | Discharge: 2015-06-13 | Disposition: A | Discharge status: Home / Self Care | Payer: Medicaid Other | Attending: Emergency Medicine | Admitting: Emergency Medicine

## 2015-06-12 ENCOUNTER — Encounter (HOSPITAL_COMMUNITY): Payer: Self-pay | Admitting: Family Medicine

## 2015-06-12 DIAGNOSIS — F17210 Nicotine dependence, cigarettes, uncomplicated: Secondary | ICD-10-CM | POA: Diagnosis not present

## 2015-06-12 DIAGNOSIS — Z7982 Long term (current) use of aspirin: Secondary | ICD-10-CM | POA: Insufficient documentation

## 2015-06-12 DIAGNOSIS — S6992XA Unspecified injury of left wrist, hand and finger(s), initial encounter: Secondary | ICD-10-CM | POA: Diagnosis present

## 2015-06-12 DIAGNOSIS — Z590 Homelessness: Secondary | ICD-10-CM | POA: Diagnosis not present

## 2015-06-12 DIAGNOSIS — S61412A Laceration without foreign body of left hand, initial encounter: Secondary | ICD-10-CM | POA: Diagnosis not present

## 2015-06-12 DIAGNOSIS — Y9389 Activity, other specified: Secondary | ICD-10-CM | POA: Insufficient documentation

## 2015-06-12 DIAGNOSIS — F319 Bipolar disorder, unspecified: Secondary | ICD-10-CM | POA: Insufficient documentation

## 2015-06-12 DIAGNOSIS — F1010 Alcohol abuse, uncomplicated: Secondary | ICD-10-CM

## 2015-06-12 DIAGNOSIS — Z8739 Personal history of other diseases of the musculoskeletal system and connective tissue: Secondary | ICD-10-CM | POA: Insufficient documentation

## 2015-06-12 DIAGNOSIS — Y9289 Other specified places as the place of occurrence of the external cause: Secondary | ICD-10-CM | POA: Diagnosis not present

## 2015-06-12 DIAGNOSIS — W010XXA Fall on same level from slipping, tripping and stumbling without subsequent striking against object, initial encounter: Secondary | ICD-10-CM | POA: Diagnosis not present

## 2015-06-12 DIAGNOSIS — Z23 Encounter for immunization: Secondary | ICD-10-CM | POA: Diagnosis not present

## 2015-06-12 DIAGNOSIS — Z7984 Long term (current) use of oral hypoglycemic drugs: Secondary | ICD-10-CM | POA: Diagnosis not present

## 2015-06-12 DIAGNOSIS — Z7951 Long term (current) use of inhaled steroids: Secondary | ICD-10-CM | POA: Diagnosis not present

## 2015-06-12 DIAGNOSIS — Z79899 Other long term (current) drug therapy: Secondary | ICD-10-CM | POA: Diagnosis not present

## 2015-06-12 DIAGNOSIS — Y998 Other external cause status: Secondary | ICD-10-CM | POA: Insufficient documentation

## 2015-06-12 DIAGNOSIS — Z9889 Other specified postprocedural states: Secondary | ICD-10-CM | POA: Insufficient documentation

## 2015-06-12 DIAGNOSIS — E119 Type 2 diabetes mellitus without complications: Secondary | ICD-10-CM | POA: Insufficient documentation

## 2015-06-12 DIAGNOSIS — I10 Essential (primary) hypertension: Secondary | ICD-10-CM | POA: Insufficient documentation

## 2015-06-12 MED ORDER — LIDOCAINE-EPINEPHRINE 2 %-1:100000 IJ SOLN
20.0000 mL | Freq: Once | INTRAMUSCULAR | Status: AC
  Administered 2015-06-12: 20 mL via INTRADERMAL
  Filled 2015-06-12: qty 1

## 2015-06-12 MED ORDER — TETANUS-DIPHTH-ACELL PERTUSSIS 5-2.5-18.5 LF-MCG/0.5 IM SUSP
0.5000 mL | Freq: Once | INTRAMUSCULAR | Status: AC
  Administered 2015-06-12: 0.5 mL via INTRAMUSCULAR
  Filled 2015-06-12: qty 0.5

## 2015-06-12 NOTE — ED Provider Notes (Signed)
CSN: [id number]     Arrival date & time 06/12/15  2334 History   First MD Initiated Contact with Patient 06/12/15 2337     Chief Complaint  Patient presents with  . Extremity Laceration     (Consider location/radiation/quality/duration/timing/severity/associated sxs/prior Treatment) HPI   55 year old male who is homeless brought here via EMS for evaluation of a left hand laceration. Pt report he tripped and fell on the concrete, suffering a large laceration to the dorsum of his L hand PTA. Doesnt know last tetanus shot.  Patient admits to drinking a moderate amount of alcohol today and admits to drinking alcohol on a regular basis.  Sts he drank 3 pints of liquor and 2 x 24oz of beer. Report minimal pain but pt is intoxicated, therefore hx is limited.  Level V caveat applies.  He denies headache, neck pain, cp, sob, abd pain, back pain.  Pt does not want to answer any additional questions from me.    Past Medical History  Diagnosis Date  . Hypertension   . Depression   . Diabetes mellitus without complication (HCC)   . S/P cardiac catheterization, 09/14/12 with mild non obstructive CAD and EF of 55% 09/15/2012  . DJD (degenerative joint disease) of cervical spine     on disability  . Bipolar disorder [facility])    Past Surgical History  Procedure Laterality Date  . Back surgery    . Left heart catheterization with coronary angiogram N/A 09/14/2012    Procedure: LEFT HEART CATHETERIZATION WITH CORONARY ANGIOGRAM;  Surgeon: Thurmon Fair, MD;  Location: [facility] CATH [facility];  Service: Cardiovascular;  Laterality: N/A;   Family History  Problem Relation Age of Onset  . Heart disease Brother     has had heart transplant  . Diabetes Mother   . Cancer Mother   . Hypertension Mother   . Cancer Brother   . Diabetes Brother   . Cancer Brother    Social History  Substance Use Topics  . Smoking status: Current Every Day Smoker -- 1.00 packs/day for 25 years    Types: Cigarettes    Last Attempt to  Quit: 10/11/2012  . Smokeless tobacco: Never Used  . Alcohol Use: 1.2 oz/week    2 Cans of beer per week     Comment: Last drink: PTA    Review of Systems  Reason unable to perform ROS: Pt is intoxicated and non cooperative.      Allergies  Citrus  Home Medications   Prior to Admission medications   Medication Sig Start Date End Date Taking? Authorizing Provider  aspirin 81 MG tablet Take 1 tablet (81 mg total) by mouth daily. For heart health 10/12/14   Adonis Brook, NP  atorvastatin (LIPITOR) 40 MG tablet Take 1 tablet (40 mg total) by mouth daily. Patient not taking: Reported on 12/08/2014 10/12/14   Shuvon B Rankin, NP  benztropine (COGENTIN) 0.5 MG tablet Take 1 tablet (0.5 mg total) by mouth 2 (two) times daily. Patient not taking: Reported on 12/08/2014 10/12/14   Shuvon B Rankin, NP  escitalopram (LEXAPRO) 20 MG tablet Take 1 tablet (20 mg total) by mouth daily. 10/12/14   Shuvon B Rankin, NP  fluticasone (FLONASE) 50 MCG/ACT nasal spray Place 1 spray into both nostrils daily as needed for allergies or rhinitis. 10/12/14   Adonis Brook, NP  gabapentin (NEURONTIN) 100 MG capsule Take 1 capsule (100 mg total) by mouth 3 (three) times daily. Patient not taking: Reported on 12/08/2014 10/12/14   [facility]  B Rankin, NP  haloperidol (HALDOL) 2 MG tablet Take 1 tablet (2 mg total) by mouth at bedtime. Patient taking differently: Take 2 mg by mouth 2 (two) times daily.  10/12/14   Shuvon B Rankin, NP  lisinopril (PRINIVIL,ZESTRIL) 20 MG tablet Take 1 tablet (20 mg total) by mouth daily. 10/12/14   Shuvon B Rankin, NP  metFORMIN (GLUCOPHAGE) 1000 MG tablet Take 1 tablet (1,000 mg total) by mouth 2 (two) times daily with a meal. 10/12/14   Shuvon B Rankin, NP  nicotine (NICODERM CQ - DOSED IN MG/24 HOURS) 21 mg/24hr patch Place 1 patch (21 mg total) onto the skin daily. Patient not taking: Reported on 12/08/2014 10/12/14   Shuvon B Rankin, NP  traZODone (DESYREL) 50 MG tablet Take 1 tablet (50 mg total) by  mouth at bedtime. Patient not taking: Reported on 12/08/2014 10/12/14   Shuvon B Rankin, NP   BP 165/97 mmHg  Pulse 97  Temp(Src) 97.8 F (36.6 C) (Oral)  Resp 16  SpO2 96% Physical Exam  Constitutional: He appears well-developed and well-nourished. No distress.  HENT:  Head: Atraumatic.  Eyes: Conjunctivae are normal.  Neck: Neck supple.  Musculoskeletal: He exhibits tenderness (L hand: ).  Neurological: He is alert.  Skin: No rash noted.  Small abrasion noted to dorsum of R thumb, nontender, without crepitus or deformity.   Psychiatric: He has a normal mood and affect.  Nursing note and vitals reviewed.   ED Course  Procedures (including critical care time) Labs Review Labs Reviewed - No data to display  Imaging Review No results found. I have personally reviewed and evaluated these images and lab results as part of my medical decision-making.   EKG Interpretation None      MDM   Final diagnoses:  Laceration of left hand, initial encounter  Alcohol abuse    BP 165/97 mmHg  Pulse 97  Temp(Src) 97.8 F (36.6 C) (Oral)  Resp 16  SpO2 96%   Pt suffered a large laceration to the dorsum of his L hand from a fall.  No obvious bony pathology.  Wound were sutured by me.  Pt was not sober, therefore pt sign out to oncoming provider.    LACERATION REPAIR Performed by: Fayrene Helper Authorized byFayrene Helper Consent: Verbal consent obtained. Risks and benefits: risks, benefits and alternatives were discussed Consent given by: patient Patient identity confirmed: provided demographic data Prepped and Draped in normal sterile fashion Wound explored  Laceration Location: L hand, dorsum  Laceration Length: 8cm, stellate shape  No Foreign Bodies seen or palpated  Anesthesia: local infiltration  Local anesthetic: lidocaine 2% w epinephrine  Anesthetic total: 9 ml  Irrigation method: syringe Amount of cleaning: standard  Skin closure: prolene 5.0  Number of  sutures: 11  Technique: simple interrupted and horizontal mattress  Patient tolerance: Patient tolerated the procedure well with no immediate complications.   Fayrene Helper, PA-C 06/13/15 1403  Shon Baton, MD 06/14/15 [phone]

## 2015-06-12 NOTE — ED Notes (Signed)
Bed: WA07 Expected date:  Expected time:  Means of arrival:  Comments: EMS Fall/finger lac

## 2015-06-12 NOTE — ED Notes (Signed)
Pt is homeless and transported by [facility]. Pt has had 3 pints of liquor and 2 x 24 oz of beer. Pt is here for a left hand laceration.

## 2015-06-12 NOTE — Discharge Instructions (Signed)
Please follow up with your doctor in 7 days to have your sutures removed.    Laceration Care, Adult A laceration is a cut that goes through all of the layers of the skin and into the tissue that is right under the skin. Some lacerations heal on their own. Others need to be closed with stitches (sutures), staples, skin adhesive strips, or skin glue. Proper laceration care minimizes the risk of infection and helps the laceration to heal better. HOW TO CARE FOR YOUR LACERATION If sutures or staples were used:  Keep the wound clean and dry.  If you were given a bandage (dressing), you should change it at least one time per day or as told by your health care provider. You should also change it if it becomes wet or dirty.  Keep the wound completely dry for the first 24 hours or as told by your health care provider. After that time, you may shower or bathe. However, make sure that the wound is not soaked in water until after the sutures or staples have been removed.  Clean the wound one time each day or as told by your health care provider:  Wash the wound with soap and water.  Rinse the wound with water to remove all soap.  Pat the wound dry with a clean towel. Do not rub the wound.  After cleaning the wound, apply a thin layer of antibiotic ointmentas told by your health care provider. This will help to prevent infection and keep the dressing from sticking to the wound.  Have the sutures or staples removed as told by your health care provider. If skin adhesive strips were used:  Keep the wound clean and dry.  If you were given a bandage (dressing), you should change it at least one time per day or as told by your health care provider. You should also change it if it becomes dirty or wet.  Do not get the skin adhesive strips wet. You may shower or bathe, but be careful to keep the wound dry.  If the wound gets wet, pat it dry with a clean towel. Do not rub the wound.  Skin adhesive  strips fall off on their own. You may trim the strips as the wound heals. Do not remove skin adhesive strips that are still stuck to the wound. They will fall off in time. If skin glue was used:  Try to keep the wound dry, but you may briefly wet it in the shower or bath. Do not soak the wound in water, such as by swimming.  After you have showered or bathed, gently pat the wound dry with a clean towel. Do not rub the wound.  Do not do any activities that will make you sweat heavily until the skin glue has fallen off on its own.  Do not apply liquid, cream, or ointment medicine to the wound while the skin glue is in place. Using those may loosen the film before the wound has healed.  If you were given a bandage (dressing), you should change it at least one time per day or as told by your health care provider. You should also change it if it becomes dirty or wet.  If a dressing is placed over the wound, be careful not to apply tape directly over the skin glue. Doing that may cause the glue to be pulled off before the wound has healed.  Do not pick at the glue. The skin glue usually remains in place  for 5-10 days, then it falls off of the skin. General Instructions  Take over-the-counter and prescription medicines only as told by your health care provider.  If you were prescribed an antibiotic medicine or ointment, take or apply it as told by your doctor. Do not stop using it even if your condition improves.  To help prevent scarring, make sure to cover your wound with sunscreen whenever you are outside after stitches are removed, after adhesive strips are removed, or when glue remains in place and the wound is healed. Make sure to wear a sunscreen of at least 30 SPF.  Do not scratch or pick at the wound.  Keep all follow-up visits as told by your health care provider. This is important.  Check your wound every day for signs of infection. Watch for:  Redness, swelling, or pain.  Fluid,  blood, or pus.  Raise (elevate) the injured area above the level of your heart while you are sitting or lying down, if possible. SEEK MEDICAL CARE IF:  You received a tetanus shot and you have swelling, severe pain, redness, or bleeding at the injection site.  You have a fever.  A wound that was closed breaks open.  You notice a bad smell coming from your wound or your dressing.  You notice something coming out of the wound, such as wood or glass.  Your pain is not controlled with medicine.  You have increased redness, swelling, or pain at the site of your wound.  You have fluid, blood, or pus coming from your wound.  You notice a change in the color of your skin near your wound.  You need to change the dressing frequently due to fluid, blood, or pus draining from the wound.  You develop a new rash.  You develop numbness around the wound. SEEK IMMEDIATE MEDICAL CARE IF:  You develop severe swelling around the wound.  Your pain suddenly increases and is severe.  You develop painful lumps near the wound or on skin that is anywhere on your body.  You have a red streak going away from your wound.  The wound is on your hand or foot and you cannot properly move a finger or toe.  The wound is on your hand or foot and you notice that your fingers or toes look pale or bluish.   This information is not intended to replace advice given to you by your health care provider. Make sure you discuss any questions you have with your health care provider.   Document Released: 04/21/2005 Document Revised: 09/05/2014 Document Reviewed: 04/17/2014 Elsevier Interactive Patient Education 2016 [employer]  Alcohol Use Disorder Alcohol use disorder is a mental disorder. It is not a one-time incident of heavy drinking. Alcohol use disorder is the excessive and uncontrollable use of alcohol over time that leads to problems with functioning in one or more areas of daily living. People with this  disorder risk harming themselves and others when they drink to excess. Alcohol use disorder also can cause other mental disorders, such as mood and anxiety disorders, and serious physical problems. People with alcohol use disorder often misuse other drugs.  Alcohol use disorder is common and widespread. Some people with this disorder drink alcohol to cope with or escape from negative life events. Others drink to relieve chronic pain or symptoms of mental illness. People with a family history of alcohol use disorder are at higher risk of losing control and using alcohol to excess.  Drinking too much alcohol  can cause injury, accidents, and health problems. One drink can be too much when you are:  Working.  Pregnant or breastfeeding.  Taking medicines. Ask your doctor.  Driving or planning to drive. SYMPTOMS  Signs and symptoms of alcohol use disorder may include the following:   Consumption ofalcohol inlarger amounts or over a longer period of time than intended.  Multiple unsuccessful attempts to cutdown or control alcohol use.   A great deal of time spent obtaining alcohol, using alcohol, or recovering from the effects of alcohol (hangover).  A strong desire or urge to use alcohol (cravings).   Continued use of alcohol despite problems at work, school, or home because of alcohol use.   Continued use of alcohol despite problems in relationships because of alcohol use.  Continued use of alcohol in situations when it is physically hazardous, such as driving a car.  Continued use of alcohol despite awareness of a physical or psychological problem that is likely related to alcohol use. Physical problems related to alcohol use can involve the brain, heart, liver, stomach, and intestines. Psychological problems related to alcohol use include intoxication, depression, anxiety, psychosis, delirium, and dementia.   The need for increased amounts of alcohol to achieve the same desired  effect, or a decreased effect from the consumption of the same amount of alcohol (tolerance).  Withdrawal symptoms upon reducing or stopping alcohol use, or alcohol use to reduce or avoid withdrawal symptoms. Withdrawal symptoms include:  Racing heart.  Hand tremor.  Difficulty sleeping.  Nausea.  Vomiting.  Hallucinations.  Restlessness.  Seizures. DIAGNOSIS Alcohol use disorder is diagnosed through an assessment by your health care provider. Your health care provider may start by asking three or four questions to screen for excessive or problematic alcohol use. To confirm a diagnosis of alcohol use disorder, at least two symptoms must be present within a 68-month period. The severity of alcohol use disorder depends on the number of symptoms:  Mild--two or three.  Moderate--four or five.  Severe--six or more. Your health care provider may perform a physical exam or use results from lab tests to see if you have physical problems resulting from alcohol use. Your health care provider may refer you to a mental health professional for evaluation. TREATMENT  Some people with alcohol use disorder are able to reduce their alcohol use to low-risk levels. Some people with alcohol use disorder need to quit drinking alcohol. When necessary, mental health professionals with specialized training in substance use treatment can help. Your health care provider can help you decide how severe your alcohol use disorder is and what type of treatment you need. The following forms of treatment are available:   Detoxification. Detoxification involves the use of prescription medicines to prevent alcohol withdrawal symptoms in the first week after quitting. This is important for people with a history of symptoms of withdrawal and for heavy drinkers who are likely to have withdrawal symptoms. Alcohol withdrawal can be dangerous and, in severe cases, cause death. Detoxification is usually provided in a hospital  or in-patient substance use treatment facility.  Counseling or talk therapy. Talk therapy is provided by substance use treatment counselors. It addresses the reasons people use alcohol and ways to keep them from drinking again. The goals of talk therapy are to help people with alcohol use disorder find healthy activities and ways to cope with life stress, to identify and avoid triggers for alcohol use, and to handle cravings, which can cause relapse.  Medicines.Different medicines can help  treat alcohol use disorder through the following actions:  Decrease alcohol cravings.  Decrease the positive reward response felt from alcohol use.  Produce an uncomfortable physical reaction when alcohol is used (aversion therapy).  Support groups. Support groups are run by people who have quit drinking. They provide emotional support, advice, and guidance. These forms of treatment are often combined. Some people with alcohol use disorder benefit from intensive combination treatment provided by specialized substance use treatment centers. Both inpatient and outpatient treatment programs are available.   This information is not intended to replace advice given to you by your health care provider. Make sure you discuss any questions you have with your health care provider.   Document Released: 05/29/2004 Document Revised: 05/12/2014 Document Reviewed: 07/29/2012 Elsevier Interactive Patient Education [employer]

## 2015-06-13 LAB — CBG MONITORING, ED: GLUCOSE-CAPILLARY: 188 mg/dL — AB (ref 65–99)

## 2015-06-13 NOTE — ED Provider Notes (Signed)
Patient seen originally by Kelton Pillar and handed off to me at change of shift. He sustained a laceration to his left hand and was brought in by EMS. The patient was to sober up and be re-evaluated.  The patient around 2:30 am got up to use the bathroom when his hand started to bleed. He became agitated because the gauze had come off and was threatening staff and yelling at people to get out his room and making rude comments to Nurse Tech and RN. Security needed to be called. The nurse Carolyne Fiscal and myself cleaned and wrapped the wound well.  The patient was aware of his he hurt his hand, where he was and was walking with a steady gait. He continued to be demanding of staff and rude. It was determined at this time that he was not experiencing SiHI and was sober enough for home and discharged. Wound care education discussed and written out on discharge paperwork. He received tetanus during his stay.   Eduardo Pel, PA-C 06/13/15 1610  Eduardo Baton, MD 06/13/15 [phone]

## 2015-06-13 NOTE — ED Notes (Signed)
[facility] patient screaming at staff members, went to room. Pt was standing up, with no bandage on his left had from previously being sutured, dripping blood. Pt reports he woke up, went to urinate in a container, dressing came off, and started bleeding. Tiffany, PA was already at bedside. Assisted with cleaning the wound. Sutures remain intact. Using clean technique, applied telfa pads x 3, one 4x4 guaze, and secured with kerlix and coban. Pt remains smelling of ETOH.

## 2015-06-27 ENCOUNTER — Emergency Department (HOSPITAL_COMMUNITY)
Admission: EM | Admit: 2015-06-27 | Discharge: 2015-06-27 | Disposition: A | Discharge status: Home / Self Care | Payer: Medicaid Other

## 2017-02-24 LAB — POCT GLYCOSYLATED HEMOGLOBIN (HGB A1C): Hemoglobin A1C: 14

## 2017-02-24 LAB — GLUCOSE, POCT (MANUAL RESULT ENTRY): POC Glucose: 531 mg/dl — AB (ref 70–99)

## 2017-03-30 ENCOUNTER — Emergency Department (HOSPITAL_COMMUNITY): Payer: Medicaid Other

## 2017-03-30 ENCOUNTER — Emergency Department (HOSPITAL_COMMUNITY)
Admission: EM | Admit: 2017-03-30 | Discharge: 2017-03-30 | Disposition: A | Discharge status: Home / Self Care | Payer: Medicaid Other | Attending: Emergency Medicine | Admitting: Emergency Medicine

## 2017-03-30 ENCOUNTER — Encounter (HOSPITAL_COMMUNITY): Payer: Self-pay | Admitting: Emergency Medicine

## 2017-03-30 DIAGNOSIS — F17210 Nicotine dependence, cigarettes, uncomplicated: Secondary | ICD-10-CM | POA: Insufficient documentation

## 2017-03-30 DIAGNOSIS — R079 Chest pain, unspecified: Secondary | ICD-10-CM | POA: Diagnosis not present

## 2017-03-30 DIAGNOSIS — Z7982 Long term (current) use of aspirin: Secondary | ICD-10-CM | POA: Diagnosis not present

## 2017-03-30 DIAGNOSIS — L732 Hidradenitis suppurativa: Secondary | ICD-10-CM | POA: Diagnosis not present

## 2017-03-30 DIAGNOSIS — Z79899 Other long term (current) drug therapy: Secondary | ICD-10-CM | POA: Diagnosis not present

## 2017-03-30 DIAGNOSIS — E119 Type 2 diabetes mellitus without complications: Secondary | ICD-10-CM | POA: Diagnosis not present

## 2017-03-30 DIAGNOSIS — Z7984 Long term (current) use of oral hypoglycemic drugs: Secondary | ICD-10-CM | POA: Insufficient documentation

## 2017-03-30 DIAGNOSIS — I10 Essential (primary) hypertension: Secondary | ICD-10-CM | POA: Insufficient documentation

## 2017-03-30 LAB — BASIC METABOLIC PANEL
Anion gap: 10 (ref 5–15)
BUN: 13 mg/dL (ref 6–20)
CALCIUM: 9.3 mg/dL (ref 8.9–10.3)
CO2: 24 mmol/L (ref 22–32)
CREATININE: 1.12 mg/dL (ref 0.61–1.24)
Chloride: 99 mmol/L — ABNORMAL LOW (ref 101–111)
GFR calc Af Amer: >60 mL/min (ref >60)
GLUCOSE: 349 mg/dL — AB (ref 65–99)
POTASSIUM: 4.2 mmol/L (ref 3.5–5.1)
SODIUM: 133 mmol/L — AB (ref 135–145)

## 2017-03-30 LAB — CBC
HCT: 44.5 % (ref 39.0–52.0)
Hemoglobin: 15.9 g/dL (ref 13.0–17.0)
MCH: 31.2 pg (ref 26.0–34.0)
MCHC: 35.7 g/dL (ref 30.0–36.0)
MCV: 87.3 fL (ref 78.0–100.0)
PLATELETS: 251 10*3/uL (ref 150–400)
RBC: 5.1 MIL/uL (ref 4.22–5.81)
RDW: 12.1 % (ref 11.5–15.5)
WBC: 5.4 10*3/uL (ref 4.0–10.5)

## 2017-03-30 LAB — I-STAT TROPONIN, ED
TROPONIN I, POC: 0 ng/mL (ref 0.00–0.08)
Troponin i, poc: 0.01 ng/mL (ref 0.00–0.08)

## 2017-03-30 LAB — D-DIMER, QUANTITATIVE (NOT AT ARMC): D DIMER QUANT: 13.03 ug{FEU}/mL — AB (ref 0.00–0.50)

## 2017-03-30 MED ORDER — TRAMADOL HCL 50 MG PO TABS: 50.0000 mg | ORAL_TABLET | Freq: Four times a day (QID) | ORAL | 0 refills | Status: DC | PRN

## 2017-03-30 MED ORDER — SODIUM CHLORIDE 0.9 % IV BOLUS (SEPSIS)
1000.0000 mL | Freq: Once | INTRAVENOUS | Status: AC
  Administered 2017-03-30: 1000 mL via INTRAVENOUS

## 2017-03-30 MED ORDER — MORPHINE SULFATE (PF) 4 MG/ML IV SOLN
4.0000 mg | Freq: Once | INTRAVENOUS | Status: AC
  Administered 2017-03-30: 4 mg via INTRAVENOUS
  Filled 2017-03-30: qty 1

## 2017-03-30 MED ORDER — HYDROCODONE-ACETAMINOPHEN 5-325 MG PO TABS
1.0000 | ORAL_TABLET | ORAL | Status: AC
  Administered 2017-03-30: 1 via ORAL
  Filled 2017-03-30: qty 1

## 2017-03-30 MED ORDER — DOXYCYCLINE HYCLATE 100 MG PO CAPS: 100.0000 mg | ORAL_CAPSULE | Freq: Two times a day (BID) | ORAL | 0 refills | Status: DC

## 2017-03-30 MED ORDER — IOPAMIDOL (ISOVUE-300) INJECTION 61%: 100.0000 mL | Freq: Once | INTRAVENOUS | Status: DC | PRN

## 2017-03-30 MED ORDER — IOPAMIDOL (ISOVUE-370) INJECTION 76%
INTRAVENOUS | Status: AC
  Administered 2017-03-30: 100 mL via INTRAVENOUS
  Filled 2017-03-30: qty 100

## 2017-03-30 NOTE — ED Notes (Signed)
Bed: WA01 Expected date:  Expected time:  Means of arrival:  Comments: 

## 2017-03-30 NOTE — ED Notes (Signed)
Patient transported to CT 

## 2017-03-30 NOTE — ED Notes (Signed)
Lab will add on D-dimer

## 2017-03-30 NOTE — ED Notes (Signed)
Pt in CT.

## 2017-03-30 NOTE — Discharge Instructions (Signed)
Take antibiotics as prescribed.  Follow-up with your primary care doctor or cardiologist for further evaluation of your chest pain.  Return as needed for worsening symptoms.

## 2017-03-30 NOTE — ED Triage Notes (Addendum)
Pt reports intermittent L side CP for the past several months. Pt went to get bloodwork done at PCP office today, but when he told the provider he was having CP, he was sent here for eval. Pt also reports he also has boils under both arm. Pain from boils radiates to L side of CP.

## 2017-03-30 NOTE — ED Provider Notes (Signed)
[facility]-EMERGENCY DEPT Provider Note   CSN: [id number] Arrival date & time: 03/30/17  [id number]     History   Chief Complaint Chief Complaint  Patient presents with  . Chest Pain    HPI Eduardo French is a 56 y.o. male.  HPI Pt has history of CAD.  Prior cath in 2014 that showed mild non obstructive disease.  This past year over the summer, the patient has been having intermittent chest pain.  It started to get worse and today it was hurting him.  He went to his doctors office to get blood work but when he told them he was having the pain he was told to come to the ED.  He also noticed some swollen glands in his axillary region.  He wondered if that was a contributing factor.  No history of PE, blood clots.  Pt does smoke cigarettes occasionally. Past Medical History:  Diagnosis Date  . Bipolar disorder (HCC)   . Depression   . Diabetes mellitus without complication (HCC)   . DJD (degenerative joint disease) of cervical spine    on disability  . Hypertension   . S/P cardiac catheterization, 09/14/12 with mild non obstructive CAD and EF of 55% 09/15/2012    Patient Active Problem List   Diagnosis Date Noted  . Alcohol abuse with intoxication (HCC) 10/08/2014  . Cocaine abuse with intoxication and without complication (HCC) 10/08/2014  . Alcohol dependence with withdrawal, uncomplicated (HCC) 10/07/2014  . Cocaine abuse (HCC) 10/07/2014  . Substance induced mood disorder (HCC) 10/07/2014  . Cocaine use disorder, mild, abuse (HCC)   . Alcohol use disorder, moderate, dependence (HCC)   . Bipolar 1 disorder, depressed, severe (HCC) 06/10/2014  . Obesity (BMI 30.0-34.9) 04/11/2013  . Alcohol dependence (HCC) 11/06/2012  . Cocaine dependence (HCC) 11/06/2012  . Paranoid schizophrenia, chronic condition (HCC) 11/06/2012  . Alcohol abuse 11/05/2012  . S/P cardiac catheterization, 09/14/12 with mild non obstructive CAD and EF of 55% 09/15/2012  . HTN  (hypertension), benign 09/12/2012  . DM type 2 (diabetes mellitus, type 2) (HCC) 09/12/2012  . BPH (benign prostatic hyperplasia) 09/12/2012  . Unstable angina, cardiac cath without obstruction 09/12/2012  . Smoker 09/12/2012  . 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 Depression 09/12/2012  . Bipolar disorder (HCC) 09/12/2012  . Dyslipidemia- (LDL 190 in 2011) 09/12/2012    Past Surgical History:  Procedure Laterality Date  . BACK SURGERY    . LEFT HEART CATHETERIZATION WITH CORONARY ANGIOGRAM N/A 09/14/2012   Procedure: LEFT HEART CATHETERIZATION WITH CORONARY ANGIOGRAM;  Surgeon: Thurmon FairMihai Croitoru, MD;  Location: [facility] CATH LAB;  Service: Cardiovascular;  Laterality: N/A;       Home Medications    Prior to Admission medications   Medication Sig Start Date End Date Taking? Authorizing Provider  aspirin 81 MG tablet Take 1 tablet (81 mg total) by mouth daily. For heart health 10/12/14  Yes Adonis BrookAgustin, Sheila, NP  benztropine (COGENTIN) 0.5 MG tablet Take 1 tablet (0.5 mg total) by mouth 2 (two) times daily. 10/12/14  Yes Rankin, Shuvon B, NP  ibuprofen (ADVIL,MOTRIN) 800 MG tablet Take 800 mg by mouth every 8 (eight) hours as needed for moderate pain.   Yes [provider]  insulin aspart protamine- aspart (NOVOLOG MIX 70/30) (70-30) 100 UNIT/ML injection Inject 20 Units into the skin 2 (two) times daily with a meal.   Yes [provider]  lisinopril (PRINIVIL,ZESTRIL) 20 MG tablet Take 1 tablet (20  mg total) by mouth daily. 10/12/14  Yes Rankin, Shuvon B, NP  metFORMIN (GLUCOPHAGE) 1000 MG tablet Take 1 tablet (1,000 mg total) by mouth 2 (two) times daily with a meal. 10/12/14  Yes Rankin, Shuvon B, NP  atorvastatin (LIPITOR) 40 MG tablet Take 1 tablet (40 mg total) by mouth daily. Patient not taking: Reported on 12/08/2014 10/12/14   Rankin, Shuvon B, NP  doxycycline (VIBRAMYCIN) 100 MG capsule Take 1 capsule (100 mg total) by mouth 2 (two) times daily. 03/30/17   Linwood Dibbles, MD  escitalopram (LEXAPRO) 20 MG tablet Take 1 tablet (20 mg total) by mouth daily. Patient not taking: Reported on 03/30/2017 10/12/14   Rankin, Shuvon B, NP  fluticasone (FLONASE) 50 MCG/ACT nasal spray Place 1 spray into both nostrils daily as needed for allergies or rhinitis. Patient not taking: Reported on 03/30/2017 10/12/14   Adonis Brook, NP  gabapentin (NEURONTIN) 100 MG capsule Take 1 capsule (100 mg total) by mouth 3 (three) times daily. Patient not taking: Reported on 12/08/2014 10/12/14   Rankin, Shuvon B, NP  haloperidol (HALDOL) 2 MG tablet Take 1 tablet (2 mg total) by mouth at bedtime. Patient not taking: Reported on 06/13/2015 10/12/14   Rankin, Shuvon B, NP  nicotine (NICODERM CQ - DOSED IN MG/24 HOURS) 21 mg/24hr patch Place 1 patch (21 mg total) onto the skin daily. Patient not taking: Reported on 12/08/2014 10/12/14   Rankin, Shuvon B, NP  traMADol (ULTRAM) 50 MG tablet Take 1 tablet (50 mg total) by mouth every 6 (six) hours as needed. 03/30/17   Linwood Dibbles, MD  traZODone (DESYREL) 50 MG tablet Take 1 tablet (50 mg total) by mouth at bedtime. Patient not taking: Reported on 12/08/2014 10/12/14   Rankin, Denice Bors B, NP    Family History Family History  Problem Relation Age of Onset  . Heart disease Brother        has had heart transplant  . Diabetes Mother   . Cancer Mother   . Hypertension Mother   . Cancer Brother   . Diabetes Brother   . Cancer Brother     Social History Social History   Tobacco Use  . Smoking status: Current Every Day Smoker    Packs/day: 1.00    Years: 25.00    Pack years: 25.00    Types: Cigarettes    Last attempt to quit: 10/11/2012    Years since quitting: 4.4  . Smokeless tobacco: Never Used  Substance Use Topics  . Alcohol use: Yes    Alcohol/week: 1.2 oz    Types: 2 Cans of beer per week    Comment: Last drink: PTA  . Drug use: Yes    Types: Cocaine     Allergies   Citrus   Review of Systems Review of Systems  Constitutional:  Negative for fever.  Respiratory: Positive for shortness of breath. Negative for cough.   Cardiovascular: Positive for chest pain.  Musculoskeletal: Positive for myalgias.  Neurological: Positive for numbness.       Feet get numb, legs cramp  All other systems reviewed and are negative.    Physical Exam Updated Vital Signs BP 132/88   Pulse 80   Temp 98.2 F (36.8 C) (Oral)   Resp 15   SpO2 96%   Physical Exam  Constitutional: He appears well-developed and well-nourished. No distress.  HENT:  Head: Normocephalic and atraumatic.  Right Ear: External ear normal.  Left Ear: External ear normal.  Eyes: Conjunctivae are normal.  Right eye exhibits no discharge. Left eye exhibits no discharge. No scleral icterus.  Neck: Neck supple. No tracheal deviation present.  Cardiovascular: Normal rate, regular rhythm and intact distal pulses.  Pulses:      Dorsalis pedis pulses are 2+ on the right side, and 2+ on the left side.  Pulmonary/Chest: Effort normal and breath sounds normal. No stridor. No respiratory distress. He has no wheezes. He has no rales.  Abdominal: Soft. Bowel sounds are normal. He exhibits no distension. There is no tenderness. There is no rebound and no guarding.  Musculoskeletal: Normal range of motion. He exhibits no edema or tenderness.       Right lower leg: He exhibits no tenderness and no edema.       Left lower leg: He exhibits no tenderness and no edema.  Mild erythema left axially region, edema and ttp, no fluctuance  Neurological: He is alert. He has normal strength. No cranial nerve deficit (no facial droop, extraocular movements intact, no slurred speech) or sensory deficit. He exhibits normal muscle tone. He displays no seizure activity. Coordination normal.  Skin: Skin is warm and dry. No rash noted. No erythema.  Psychiatric: He has a normal mood and affect.  Nursing note and vitals reviewed.    ED Treatments / Results  Labs (all labs ordered are  listed, but only abnormal results are displayed) Labs Reviewed  BASIC METABOLIC PANEL - Abnormal; Notable for the following components:      Result Value   Sodium 133 (*)    Chloride 99 (*)    Glucose, Bld 349 (*)    All other components within normal limits  D-DIMER, QUANTITATIVE (NOT AT [facility]) - Abnormal; Notable for the following components:   D-Dimer, Quant 13.03 (*)    All other components within normal limits  CBC  I-STAT TROPONIN, ED  I-STAT TROPONIN, ED    EKG  EKG Interpretation  Date/Time:  Monday March 30 2017 09:53:13 EST Ventricular Rate:  89 PR Interval:    QRS Duration: 78 QT Interval:  345 QTC Calculation: 420 R Axis:   -8 Text Interpretation:  Sinus rhythm No significant change since last tracing Confirmed by Linwood Dibbles [phone]) on 03/30/2017 11:59:59 AM       Radiology Dg Chest 2 View  Result Date: 03/30/2017 CLINICAL DATA:  Intermittent left-sided chest pain for the past year. EXAM: CHEST  2 VIEW COMPARISON:  Chest x-ray dated February 19, 2014. FINDINGS: The heart size and mediastinal contours are within normal limits. Both lungs are clear. The visualized skeletal structures are unremarkable. IMPRESSION: No active cardiopulmonary disease. Electronically Signed   By: Obie Dredge M.D.   On: 03/30/2017 10:27   Ct Angio Chest Pe W And/or Wo Contrast  Result Date: 03/30/2017 CLINICAL DATA:  Chest pain. EXAM: CT ANGIOGRAPHY CHEST WITH CONTRAST TECHNIQUE: Multidetector CT imaging of the chest was performed using the standard protocol during bolus administration of intravenous contrast. Multiplanar CT image reconstructions and MIPs were obtained to evaluate the vascular anatomy. CONTRAST:  100 mL of Isovue 370 intravenously. COMPARISON:  Radiographs of same day. FINDINGS: Cardiovascular: Satisfactory opacification of the pulmonary arteries to the segmental level. No evidence of pulmonary embolism. Normal heart size. No pericardial effusion. Atherosclerosis of  thoracic aorta is noted without aneurysm or dissection. Mediastinum/Nodes: No enlarged mediastinal, hilar, or axillary lymph nodes. Thyroid gland, trachea, and esophagus demonstrate no significant findings. Lungs/Pleura: Lungs are clear. No pleural effusion or pneumothorax. Upper Abdomen: No acute abnormality. Musculoskeletal: No chest  wall abnormality. No acute or significant osseous findings. Review of the MIP images confirms the above findings. IMPRESSION: No definite evidence of pulmonary embolus. Aortic Atherosclerosis (ICD10-I70.0). Electronically Signed   By: Lupita RaiderJames  Green Jr, M.D.   On: 03/30/2017 15:20    Procedures Procedures (including critical care time)  Medications Ordered in ED Medications  iopamidol (ISOVUE-300) 61 % injection 100 mL (not administered)  HYDROcodone-acetaminophen (NORCO/VICODIN) 5-325 MG per tablet 1 tablet (not administered)  sodium chloride 0.9 % bolus 1,000 mL (0 mLs Intravenous Stopped 03/30/17 1334)  morphine 4 MG/ML injection 4 mg (4 mg Intravenous Given 03/30/17 1351)  iopamidol (ISOVUE-370) 76 % injection (100 mLs Intravenous Contrast Given 03/30/17 1453)     Initial Impression / Assessment and Plan / ED Course  I have reviewed the triage vital signs and the nursing notes.  Pertinent labs & imaging results that were available during my care of the patient were reviewed by me and considered in my medical decision making (see chart for details).  Clinical Course as of Mar 30 1617  Mon Mar 30, 2017  1335 D dimer is elevated.  WIll ct scan to evaluate further  [JK]    Clinical Course User Index [JK] Linwood DibblesKnapp, Tonee Silverstein, MD    Patient presented to the emergency room for evaluation of chest pain that has been ongoing for several months.  Patient does have a history of nonobstructive coronary artery disease.  Unfortunately patient does have cardiac risk factors and continues to smoke.  Patient's ED evaluation is reassuring.  He does have a normal EKG and 2 sets of  normal cardiac enzymes.  I doubt that he is having acute cardiac ischemia.   Considering the chronicity of the symptoms I think it is reasonable to follow-up with his cardiologist as an outpatient.  Patient did have an elevated d-dimer test however the CT scan is negative.  His elevated d-dimer may be related to infection noted in his axillary region.  This seems to be consistent with hidradenitis suppurativa.  Plan on discharge home with antibiotics.  Discussed follow-up with his primary care doctor. Final Clinical Impressions(s) / ED Diagnoses   Final diagnoses:  Chest pain, unspecified type  Hydradenitis    ED Discharge Orders        Ordered    traMADol (ULTRAM) 50 MG tablet  Every 6 hours PRN     03/30/17 1617    doxycycline (VIBRAMYCIN) 100 MG capsule  2 times daily     03/30/17 1617       Linwood DibblesKnapp, Lendora Keys, MD 03/30/17 1620

## 2017-07-08 ENCOUNTER — Emergency Department (HOSPITAL_COMMUNITY)
Admission: EM | Admit: 2017-07-08 | Discharge: 2017-07-08 | Disposition: A | Discharge status: Home / Self Care | Payer: Medicaid Other | Attending: Emergency Medicine | Admitting: Emergency Medicine

## 2017-07-08 ENCOUNTER — Other Ambulatory Visit: Payer: Self-pay

## 2017-07-08 ENCOUNTER — Encounter (HOSPITAL_COMMUNITY): Payer: Self-pay | Admitting: Emergency Medicine

## 2017-07-08 DIAGNOSIS — I10 Essential (primary) hypertension: Secondary | ICD-10-CM | POA: Diagnosis not present

## 2017-07-08 DIAGNOSIS — Z79899 Other long term (current) drug therapy: Secondary | ICD-10-CM | POA: Diagnosis not present

## 2017-07-08 DIAGNOSIS — F17210 Nicotine dependence, cigarettes, uncomplicated: Secondary | ICD-10-CM | POA: Diagnosis not present

## 2017-07-08 DIAGNOSIS — R0789 Other chest pain: Secondary | ICD-10-CM | POA: Insufficient documentation

## 2017-07-08 DIAGNOSIS — E1165 Type 2 diabetes mellitus with hyperglycemia: Secondary | ICD-10-CM | POA: Insufficient documentation

## 2017-07-08 DIAGNOSIS — R739 Hyperglycemia, unspecified: Secondary | ICD-10-CM

## 2017-07-08 DIAGNOSIS — Z7984 Long term (current) use of oral hypoglycemic drugs: Secondary | ICD-10-CM | POA: Insufficient documentation

## 2017-07-08 DIAGNOSIS — Z7982 Long term (current) use of aspirin: Secondary | ICD-10-CM | POA: Insufficient documentation

## 2017-07-08 LAB — CBC WITH DIFFERENTIAL/PLATELET
BASOS ABS: 0 10*3/uL (ref 0.0–0.1)
Basophils Relative: 0 %
EOS ABS: 0 10*3/uL (ref 0.0–0.7)
EOS PCT: 1 %
HCT: 47.5 % (ref 39.0–52.0)
Hemoglobin: 16.6 g/dL (ref 13.0–17.0)
LYMPHS PCT: 34 %
Lymphs Abs: 2 10*3/uL (ref 0.7–4.0)
MCH: 31.1 pg (ref 26.0–34.0)
MCHC: 34.9 g/dL (ref 30.0–36.0)
MCV: 89 fL (ref 78.0–100.0)
Monocytes Absolute: 0.4 10*3/uL (ref 0.1–1.0)
Monocytes Relative: 6 %
Neutro Abs: 3.3 10*3/uL (ref 1.7–7.7)
Neutrophils Relative %: 59 %
PLATELETS: 244 10*3/uL (ref 150–400)
RBC: 5.34 MIL/uL (ref 4.22–5.81)
RDW: 12.1 % (ref 11.5–15.5)
WBC: 5.7 10*3/uL (ref 4.0–10.5)

## 2017-07-08 LAB — BASIC METABOLIC PANEL
ANION GAP: 11 (ref 5–15)
BUN: 11 mg/dL (ref 6–20)
CALCIUM: 9.1 mg/dL (ref 8.9–10.3)
CO2: 25 mmol/L (ref 22–32)
Chloride: 100 mmol/L — ABNORMAL LOW (ref 101–111)
Creatinine, Ser: 0.97 mg/dL (ref 0.61–1.24)
GFR calc Af Amer: >60 mL/min (ref >60)
GFR calc non Af Amer: >60 mL/min (ref >60)
GLUCOSE: 337 mg/dL — AB (ref 65–99)
Potassium: 4 mmol/L (ref 3.5–5.1)
Sodium: 136 mmol/L (ref 135–145)

## 2017-07-08 LAB — URINALYSIS, ROUTINE W REFLEX MICROSCOPIC
Bacteria, UA: NONE SEEN
Bilirubin Urine: NEGATIVE
Hgb urine dipstick: NEGATIVE
Ketones, ur: 20 mg/dL — AB
LEUKOCYTES UA: NEGATIVE
Nitrite: NEGATIVE
PH: 6 (ref 5.0–8.0)
Protein, ur: NEGATIVE mg/dL
RBC / HPF: NONE SEEN RBC/hpf (ref 0–5)
SQUAMOUS EPITHELIAL / LPF: NONE SEEN
Specific Gravity, Urine: 1.032 — ABNORMAL HIGH (ref 1.005–1.030)

## 2017-07-08 LAB — CBG MONITORING, ED: Glucose-Capillary: 339 mg/dL — ABNORMAL HIGH (ref 65–99)

## 2017-07-08 MED ORDER — SODIUM CHLORIDE 0.9 % IV BOLUS (SEPSIS)
2000.0000 mL | Freq: Once | INTRAVENOUS | Status: AC
  Administered 2017-07-08: 2000 mL via INTRAVENOUS

## 2017-07-08 NOTE — ED Provider Notes (Signed)
[facility]-EMERGENCY DEPT Provider Note   CSN: [id number] Arrival date & time: 07/08/17  1053     History   Chief Complaint Chief Complaint  Patient presents with  . Hyperglycemia    HPI Eduardo GasserLeslie L Pohlman is a 57 y.o. male.  57 yo M with a chief complaint of hyperglycemia the patient states that he was very hungry before his doctor's appointment and so he thinks that is why his blood sugar was elevated.  It was in the 500s and so they called 911.  The patient denies any other symptoms.  Specifically he denies abdominal pain nausea vomiting fevers or chills.  The patient has had some transient chest pain that has persisted since December.  He attributes this to a lung infection which never involved a cough.  States that he still has no cough.  The pain comes and goes.  Denies exertional symptoms denies diaphoresis nausea or vomiting.  He does not like needle sticks and so has not given himself insulin for the past 2 or 3 days.   The history is provided by the patient.  Illness  This is a new problem. The current episode started 2 days ago. The problem occurs constantly. The problem has not changed since onset.Pertinent negatives include no chest pain, no abdominal pain, no headaches and no shortness of breath. Nothing aggravates the symptoms. Nothing relieves the symptoms. He has tried nothing for the symptoms. The treatment provided no relief.    Past Medical History:  Diagnosis Date  . Bipolar disorder (HCC)   . Depression   . Diabetes mellitus without complication (HCC)   . DJD (degenerative joint disease) of cervical spine    on disability  . Hypertension   . S/P cardiac catheterization, 09/14/12 with mild non obstructive CAD and EF of 55% 09/15/2012    Patient Active Problem List   Diagnosis Date Noted  . Alcohol abuse with intoxication (HCC) 10/08/2014  . Cocaine abuse with intoxication and without complication (HCC) 10/08/2014  . Alcohol dependence  with withdrawal, uncomplicated (HCC) 10/07/2014  . Cocaine abuse (HCC) 10/07/2014  . Substance induced mood disorder (HCC) 10/07/2014  . Cocaine use disorder, mild, abuse (HCC)   . Alcohol use disorder, moderate, dependence (HCC)   . Bipolar 1 disorder, depressed, severe (HCC) 06/10/2014  . Obesity (BMI 30.0-34.9) 04/11/2013  . Alcohol dependence (HCC) 11/06/2012  . Cocaine dependence (HCC) 11/06/2012  . Paranoid schizophrenia, chronic condition (HCC) 11/06/2012  . Alcohol abuse 11/05/2012  . S/P cardiac catheterization, 09/14/12 with mild non obstructive CAD and EF of 55% 09/15/2012  . HTN (hypertension), benign 09/12/2012  . DM type 2 (diabetes mellitus, type 2) (HCC) 09/12/2012  . BPH (benign prostatic hyperplasia) 09/12/2012  . Unstable angina, cardiac cath without obstruction 09/12/2012  . Smoker 09/12/2012  . 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 Depression 09/12/2012  . Bipolar disorder (HCC) 09/12/2012  . Dyslipidemia- (LDL 190 in 2011) 09/12/2012    Past Surgical History:  Procedure Laterality Date  . BACK SURGERY    . LEFT HEART CATHETERIZATION WITH CORONARY ANGIOGRAM N/A 09/14/2012   Procedure: LEFT HEART CATHETERIZATION WITH CORONARY ANGIOGRAM;  Surgeon: Thurmon FairMihai Croitoru, MD;  Location: [facility] CATH LAB;  Service: Cardiovascular;  Laterality: N/A;       Home Medications    Prior to Admission medications   Medication Sig Start Date End Date Taking? Authorizing Provider  aspirin 81 MG tablet Take 1 tablet (81 mg total) by mouth daily. For heart  health 10/12/14  Yes Adonis Brook, NP  diphenhydrAMINE (BENADRYL) 25 MG tablet Take 50 mg by mouth every 6 (six) hours as needed for itching or allergies.   Yes [provider]  fluticasone (FLONASE) 50 MCG/ACT nasal spray Place 1 spray into both nostrils daily as needed for allergies or rhinitis. 10/12/14  Yes Adonis Brook, NP  insulin aspart protamine- aspart (NOVOLOG MIX 70/30) (70-30) 100 UNIT/ML  injection Inject 20 Units into the skin 2 (two) times daily with a meal.   Yes [provider]  lisinopril (PRINIVIL,ZESTRIL) 20 MG tablet Take 1 tablet (20 mg total) by mouth daily. 10/12/14  Yes Rankin, Shuvon B, NP  TRAVATAN Z 0.004 % SOLN ophthalmic solution Place 1 drop into both eyes at bedtime. 06/29/17  Yes [provider]  atorvastatin (LIPITOR) 40 MG tablet Take 1 tablet (40 mg total) by mouth daily. Patient not taking: Reported on 12/08/2014 10/12/14   Rankin, Shuvon B, NP  benztropine (COGENTIN) 0.5 MG tablet Take 1 tablet (0.5 mg total) by mouth 2 (two) times daily. Patient not taking: Reported on 07/08/2017 10/12/14   Rankin, Shuvon B, NP  doxycycline (VIBRAMYCIN) 100 MG capsule Take 1 capsule (100 mg total) by mouth 2 (two) times daily. Patient not taking: Reported on 07/08/2017 03/30/17   Linwood Dibbles, MD  escitalopram (LEXAPRO) 20 MG tablet Take 1 tablet (20 mg total) by mouth daily. Patient not taking: Reported on 03/30/2017 10/12/14   Rankin, Shuvon B, NP  gabapentin (NEURONTIN) 100 MG capsule Take 1 capsule (100 mg total) by mouth 3 (three) times daily. Patient not taking: Reported on 12/08/2014 10/12/14   Rankin, Shuvon B, NP  haloperidol (HALDOL) 2 MG tablet Take 1 tablet (2 mg total) by mouth at bedtime. Patient not taking: Reported on 06/13/2015 10/12/14   Rankin, Shuvon B, NP  metFORMIN (GLUCOPHAGE) 1000 MG tablet Take 1 tablet (1,000 mg total) by mouth 2 (two) times daily with a meal. Patient not taking: Reported on 07/08/2017 10/12/14   Rankin, Shuvon B, NP  nicotine (NICODERM CQ - DOSED IN MG/24 HOURS) 21 mg/24hr patch Place 1 patch (21 mg total) onto the skin daily. Patient not taking: Reported on 12/08/2014 10/12/14   Rankin, Shuvon B, NP  traMADol (ULTRAM) 50 MG tablet Take 1 tablet (50 mg total) by mouth every 6 (six) hours as needed. Patient not taking: Reported on 07/08/2017 03/30/17   Linwood Dibbles, MD  traZODone (DESYREL) 50 MG tablet Take 1 tablet (50 mg total) by mouth at  bedtime. Patient not taking: Reported on 12/08/2014 10/12/14   Rankin, Denice Bors B, NP    Family History Family History  Problem Relation Age of Onset  . Heart disease Brother        has had heart transplant  . Diabetes Mother   . Cancer Mother   . Hypertension Mother   . Cancer Brother   . Diabetes Brother   . Cancer Brother     Social History Social History   Tobacco Use  . Smoking status: Current Every Day Smoker    Packs/day: 1.00    Years: 25.00    Pack years: 25.00    Types: Cigarettes    Last attempt to quit: 10/11/2012    Years since quitting: 4.7  . Smokeless tobacco: Never Used  Substance Use Topics  . Alcohol use: No    Alcohol/week: 1.2 oz    Types: 2 Cans of beer per week    Frequency: Never    Comment: Last drink:  PTA  . Drug use: No     Allergies   Citrus   Review of Systems Review of Systems  Constitutional: Negative for chills and fever.  HENT: Negative for congestion and facial swelling.   Eyes: Negative for discharge and visual disturbance.  Respiratory: Negative for shortness of breath.   Cardiovascular: Negative for chest pain and palpitations.  Gastrointestinal: Negative for abdominal pain, diarrhea and vomiting.  Musculoskeletal: Negative for arthralgias and myalgias.  Skin: Negative for color change and rash.  Neurological: Negative for tremors, syncope and headaches.  Psychiatric/Behavioral: Negative for confusion and dysphoric mood.     Physical Exam Updated Vital Signs SpO2 97%   Physical Exam  Constitutional: He is oriented to person, place, and time. He appears well-developed and well-nourished.  HENT:  Head: Normocephalic and atraumatic.  Eyes: EOM are normal. Pupils are equal, round, and reactive to light.  Neck: Normal range of motion. Neck supple. No JVD present.  Cardiovascular: Normal rate and regular rhythm. Exam reveals no gallop and no friction rub.  No murmur heard. Pulmonary/Chest: No respiratory distress. He has no  wheezes. He exhibits tenderness ( Palpation of the left chest wall about the mid axillary line about ribs 3 and 4 reproduce his pain.).  Abdominal: He exhibits no distension. There is no rebound and no guarding.  Musculoskeletal: Normal range of motion.  Neurological: He is alert and oriented to person, place, and time.  Skin: No rash noted. No pallor.  Psychiatric: He has a normal mood and affect. His behavior is normal.  Nursing note and vitals reviewed.    ED Treatments / Results  Labs (all labs ordered are listed, but only abnormal results are displayed) Labs Reviewed  BASIC METABOLIC PANEL - Abnormal; Notable for the following components:      Result Value   Chloride 100 (*)    Glucose, Bld 337 (*)    All other components within normal limits  URINALYSIS, ROUTINE W REFLEX MICROSCOPIC - Abnormal; Notable for the following components:   Specific Gravity, Urine 1.032 (*)    Glucose, UA >=500 (*)    Ketones, ur 20 (*)    All other components within normal limits  CBG MONITORING, ED - Abnormal; Notable for the following components:   Glucose-Capillary 339 (*)    All other components within normal limits  CBC WITH DIFFERENTIAL/PLATELET    EKG  EKG Interpretation None       Radiology No results found.  Procedures Procedures (including critical care time)  Medications Ordered in ED Medications  sodium chloride 0.9 % bolus 2,000 mL (2,000 mLs Intravenous New Bag/Given 07/08/17 1147)     Initial Impression / Assessment and Plan / ED Course  I have reviewed the triage vital signs and the nursing notes.  Pertinent labs & imaging results that were available during my care of the patient were reviewed by me and considered in my medical decision making (see chart for details).     57 yo M with a chief complaint of hyperglycemia.  The patient is actively eating Pringles and drinking Gatorade in front of me while he tells me his blood sugar is high.  I suspect this is likely  to dietary issues, the patient also has not been taking his insulin because he does not like the way that the needles feel.  I discussed with him the complications of diabetes and how he would like to avoid them.  I will check labs to evaluate for diabetic ketoacidosis.  Give  2 L of fluids.  Labs are reassuring.  No anion gap.  Unlikely to be DKA.  We will have him discuss with his family physician his insulin regiment though this is likely due to dietary and medical noncompliance.  1:04 PM:  I have discussed the diagnosis/risks/treatment options with the patient and family and believe the pt to be eligible for discharge home to follow-up with PCP. We also discussed returning to the ED immediately if new or worsening sx occur. We discussed the sx which are most concerning (e.g., sudden worsening pain, fever, inability to tolerate by mouth) that necessitate immediate return. Medications administered to the patient during their visit and any new prescriptions provided to the patient are listed below.  Medications given during this visit Medications  sodium chloride 0.9 % bolus 2,000 mL (2,000 mLs Intravenous New Bag/Given 07/08/17 1147)     The patient appears reasonably screen and/or stabilized for discharge and I doubt any other medical condition or other [facility] requiring further screening, evaluation, or treatment in the ED at this time prior to discharge.    Final Clinical Impressions(s) / ED Diagnoses   Final diagnoses:  Hyperglycemia  Chest wall pain    ED Discharge Orders    None       Melene Plan, DO 07/08/17 1304

## 2017-07-08 NOTE — ED Notes (Signed)
Bed: WA05 Expected date:  Expected time:  Means of arrival:  Comments: EMS hyperglycemia 

## 2017-07-08 NOTE — ED Triage Notes (Signed)
[facility]. [facility]. Pt CBG was checked as well and pt CBG read 539. When EMS showed up pt CBG was checked and read 361. Pt is AOx4 and Ambulatory. Pt is homeless.

## 2017-09-20 ENCOUNTER — Emergency Department (HOSPITAL_COMMUNITY): Payer: Medicaid Other

## 2017-09-20 ENCOUNTER — Encounter (HOSPITAL_COMMUNITY): Payer: Self-pay | Admitting: Emergency Medicine

## 2017-09-20 ENCOUNTER — Inpatient Hospital Stay (HOSPITAL_COMMUNITY)
Admission: EM | Admit: 2017-09-20 | Discharge: 2017-09-22 | DRG: 683 | Disposition: A | Discharge status: Home / Self Care | Payer: Medicaid Other | Attending: Internal Medicine | Admitting: Internal Medicine

## 2017-09-20 ENCOUNTER — Other Ambulatory Visit: Payer: Self-pay

## 2017-09-20 DIAGNOSIS — F314 Bipolar disorder, current episode depressed, severe, without psychotic features: Secondary | ICD-10-CM | POA: Diagnosis present

## 2017-09-20 DIAGNOSIS — F10129 Alcohol abuse with intoxication, unspecified: Secondary | ICD-10-CM | POA: Diagnosis present

## 2017-09-20 DIAGNOSIS — R200 Anesthesia of skin: Secondary | ICD-10-CM | POA: Diagnosis present

## 2017-09-20 DIAGNOSIS — Z833 Family history of diabetes mellitus: Secondary | ICD-10-CM

## 2017-09-20 DIAGNOSIS — I10 Essential (primary) hypertension: Secondary | ICD-10-CM | POA: Diagnosis present

## 2017-09-20 DIAGNOSIS — Z794 Long term (current) use of insulin: Secondary | ICD-10-CM

## 2017-09-20 DIAGNOSIS — T464X5A Adverse effect of angiotensin-converting-enzyme inhibitors, initial encounter: Secondary | ICD-10-CM | POA: Diagnosis present

## 2017-09-20 DIAGNOSIS — N179 Acute kidney failure, unspecified: Principal | ICD-10-CM | POA: Diagnosis present

## 2017-09-20 DIAGNOSIS — R29898 Other symptoms and signs involving the musculoskeletal system: Secondary | ICD-10-CM

## 2017-09-20 DIAGNOSIS — F200 Paranoid schizophrenia: Secondary | ICD-10-CM | POA: Diagnosis present

## 2017-09-20 DIAGNOSIS — E785 Hyperlipidemia, unspecified: Secondary | ICD-10-CM | POA: Diagnosis present

## 2017-09-20 DIAGNOSIS — I2510 Atherosclerotic heart disease of native coronary artery without angina pectoris: Secondary | ICD-10-CM | POA: Diagnosis present

## 2017-09-20 DIAGNOSIS — E119 Type 2 diabetes mellitus without complications: Secondary | ICD-10-CM

## 2017-09-20 DIAGNOSIS — F17210 Nicotine dependence, cigarettes, uncomplicated: Secondary | ICD-10-CM | POA: Diagnosis present

## 2017-09-20 DIAGNOSIS — R202 Paresthesia of skin: Secondary | ICD-10-CM

## 2017-09-20 DIAGNOSIS — F1010 Alcohol abuse, uncomplicated: Secondary | ICD-10-CM | POA: Diagnosis present

## 2017-09-20 DIAGNOSIS — Z8249 Family history of ischemic heart disease and other diseases of the circulatory system: Secondary | ICD-10-CM

## 2017-09-20 DIAGNOSIS — E1165 Type 2 diabetes mellitus with hyperglycemia: Secondary | ICD-10-CM

## 2017-09-20 DIAGNOSIS — R079 Chest pain, unspecified: Secondary | ICD-10-CM | POA: Diagnosis present

## 2017-09-20 DIAGNOSIS — Z9889 Other specified postprocedural states: Secondary | ICD-10-CM

## 2017-09-20 HISTORY — DX: Headache, unspecified: R51.9

## 2017-09-20 HISTORY — DX: Type 2 diabetes mellitus without complications: E11.9

## 2017-09-20 HISTORY — DX: Unspecified convulsions: R56.9

## 2017-09-20 HISTORY — DX: Other chronic pain: G89.29

## 2017-09-20 HISTORY — DX: Personal history of other diseases of the digestive system: Z87.19

## 2017-09-20 HISTORY — DX: Headache: R51

## 2017-09-20 HISTORY — DX: Acute kidney failure, unspecified: N17.9

## 2017-09-20 HISTORY — DX: Unspecified osteoarthritis, unspecified site: M19.90

## 2017-09-20 HISTORY — DX: Personal history of peptic ulcer disease: Z87.11

## 2017-09-20 HISTORY — DX: Pneumonia, unspecified organism: J18.9

## 2017-09-20 HISTORY — DX: Pure hypercholesterolemia, unspecified: E78.00

## 2017-09-20 HISTORY — DX: Cervicalgia: M54.2

## 2017-09-20 HISTORY — DX: Pain in unspecified shoulder: M25.519

## 2017-09-20 LAB — CBC
HEMATOCRIT: 46.2 % (ref 39.0–52.0)
Hemoglobin: 15.9 g/dL (ref 13.0–17.0)
MCH: 29.9 pg (ref 26.0–34.0)
MCHC: 34.4 g/dL (ref 30.0–36.0)
MCV: 86.8 fL (ref 78.0–100.0)
PLATELETS: 249 10*3/uL (ref 150–400)
RBC: 5.32 MIL/uL (ref 4.22–5.81)
RDW: 12.2 % (ref 11.5–15.5)
WBC: 8.3 10*3/uL (ref 4.0–10.5)

## 2017-09-20 LAB — COMPREHENSIVE METABOLIC PANEL
ALBUMIN: 4.3 g/dL (ref 3.5–5.0)
ALT: 22 U/L (ref 17–63)
ANION GAP: 12 (ref 5–15)
AST: 20 U/L (ref 15–41)
Alkaline Phosphatase: 72 U/L (ref 38–126)
BUN: 10 mg/dL (ref 6–20)
CHLORIDE: 103 mmol/L (ref 101–111)
CO2: 20 mmol/L — AB (ref 22–32)
Calcium: 10 mg/dL (ref 8.9–10.3)
Creatinine, Ser: 2.5 mg/dL — ABNORMAL HIGH (ref 0.61–1.24)
GFR calc Af Amer: 31 mL/min — ABNORMAL LOW (ref >60)
GFR calc non Af Amer: 27 mL/min — ABNORMAL LOW (ref >60)
GLUCOSE: 320 mg/dL — AB (ref 65–99)
POTASSIUM: 3.7 mmol/L (ref 3.5–5.1)
SODIUM: 135 mmol/L (ref 135–145)
TOTAL PROTEIN: 7.4 g/dL (ref 6.5–8.1)
Total Bilirubin: 1 mg/dL (ref 0.3–1.2)

## 2017-09-20 LAB — DIFFERENTIAL
Abs Immature Granulocytes: 0 10*3/uL (ref 0.0–0.1)
BASOS ABS: 0 10*3/uL (ref 0.0–0.1)
BASOS PCT: 0 %
EOS ABS: 0 10*3/uL (ref 0.0–0.7)
Eosinophils Relative: 1 %
IMMATURE GRANULOCYTES: 0 %
Lymphocytes Relative: 20 %
Lymphs Abs: 1.7 10*3/uL (ref 0.7–4.0)
Monocytes Absolute: 0.8 10*3/uL (ref 0.1–1.0)
Monocytes Relative: 10 %
Neutro Abs: 5.7 10*3/uL (ref 1.7–7.7)
Neutrophils Relative %: 69 %

## 2017-09-20 LAB — I-STAT CHEM 8, ED
BUN: 11 mg/dL (ref 6–20)
CALCIUM ION: 1.27 mmol/L (ref 1.15–1.40)
CHLORIDE: 101 mmol/L (ref 101–111)
Creatinine, Ser: 2.4 mg/dL — ABNORMAL HIGH (ref 0.61–1.24)
Glucose, Bld: 317 mg/dL — ABNORMAL HIGH (ref 65–99)
HCT: 48 % (ref 39.0–52.0)
Hemoglobin: 16.3 g/dL (ref 13.0–17.0)
POTASSIUM: 3.6 mmol/L (ref 3.5–5.1)
SODIUM: 136 mmol/L (ref 135–145)
TCO2: 21 mmol/L — ABNORMAL LOW (ref 22–32)

## 2017-09-20 LAB — RAPID URINE DRUG SCREEN, HOSP PERFORMED
AMPHETAMINES: NOT DETECTED
BENZODIAZEPINES: NOT DETECTED
Barbiturates: NOT DETECTED
Cocaine: NOT DETECTED
OPIATES: NOT DETECTED
Tetrahydrocannabinol: NOT DETECTED

## 2017-09-20 LAB — I-STAT TROPONIN, ED: Troponin i, poc: 0 ng/mL (ref 0.00–0.08)

## 2017-09-20 LAB — URINALYSIS, ROUTINE W REFLEX MICROSCOPIC
Glucose, UA: >=500 mg/dL — AB
KETONES UR: NEGATIVE mg/dL
LEUKOCYTES UA: NEGATIVE
Nitrite: NEGATIVE
PROTEIN: 100 mg/dL — AB
Specific Gravity, Urine: 1.018 (ref 1.005–1.030)
pH: 5 (ref 5.0–8.0)

## 2017-09-20 LAB — PROTIME-INR
INR: 0.94
PROTHROMBIN TIME: 12.5 s (ref 11.4–15.2)

## 2017-09-20 LAB — APTT: APTT: 26 s (ref 24–36)

## 2017-09-20 LAB — CBG MONITORING, ED
GLUCOSE-CAPILLARY: 283 mg/dL — AB (ref 65–99)
Glucose-Capillary: 232 mg/dL — ABNORMAL HIGH (ref 65–99)

## 2017-09-20 MED ORDER — SODIUM CHLORIDE 0.9 % IV BOLUS
1000.0000 mL | Freq: Once | INTRAVENOUS | Status: AC
  Administered 2017-09-20: 1000 mL via INTRAVENOUS

## 2017-09-20 MED ORDER — HYDROCODONE-ACETAMINOPHEN 5-325 MG PO TABS: 1.0000 | ORAL_TABLET | Freq: Once | ORAL | Status: DC

## 2017-09-20 NOTE — ED Notes (Signed)
Patient transported to X-ray 

## 2017-09-20 NOTE — ED Provider Notes (Signed)
[facility] EMERGENCY DEPARTMENT Provider Note   CSN: [id number] Arrival date & time: 09/20/17  1529     History   Chief Complaint Chief Complaint  Patient presents with  . Numbness    HPI Eduardo French is a 57 y.o. male past medical history of bipolar, depression, diabetes, hypertension and dyslipidemia who presents for evaluation of multiple complaints.  Patient reports that yesterday morning, he started developing left-sided facial numbness.  He said that the numbness occurs from the back of his skull and radiates forward.  He states that the entire left side feels different.  Patient states that at the same time, he had a weakness of his left upper and lower extremity.  Patient reports he was still able to walk but said it felt different.  Patient also reports that he was having dizziness that began yesterday.  He states it was not a room spinning sensation but he said whenever he walked he felt off balance.  Patient states that he felt like since yesterday, he was having difficulty speaking.  He said it sounded slightly slurred and he felt like he was having difficulty getting the words that he wanted to.  Patient reports that whenever he got dizzy, he would have some blurry vision but otherwise denies any vision changes.  Patient also reports that yesterday he started developing left-sided chest pain that he describes as an intermittent ache.  He states that the pain was not brought on by exertion and he had the pain whether or not he was exerting or resting.  He states that nothing relieves the pain.  He had no associated nausea, vomiting, diaphoresis.  Patient reports he did not take anything for the pain.  She also reports he was having generalized abdominal pain.  He has not had any nausea or vomiting.  Patient denies any recent fevers, cough.  Patient denies any history of IV drug use, saddle anesthesia, urinary or bowel incontinence.  The history is provided by the  patient.    Past Medical History:  Diagnosis Date  . ARF (acute renal failure) (HCC) 09/20/2017  . Arthritis    "right knee, left hip, right elbow, between my shoulder blades, neck" (09/21/2017)  . Bipolar disorder (HCC)   . Chronic headache    "in the back of my neck; only in the winter time" (09/21/2017)  . Chronic neck pain   . Chronic shoulder pain    "between shoulder blades" (09/21/2017)  . Depression   . DJD (degenerative joint disease) of cervical spine    on disability  . High cholesterol   . History of stomach ulcers 1980s   "they healed"  . Hypertension   . Pneumonia    "maybe twice since childhood" (09/21/2017)  . S/P cardiac catheterization, 09/14/12 with mild non obstructive CAD and EF of 55% 09/15/2012  . Seizures (HCC)    "diabetic seizures when they first tried to get my RX right" (09/21/2017)  . Type II diabetes mellitus [facility])     Patient Active Problem List   Diagnosis Date Noted  . ARF (acute renal failure) (HCC) 09/21/2017  . Chest pain 09/21/2017  . Left sided numbness 09/21/2017  . Alcohol abuse with intoxication (HCC) 10/08/2014  . Cocaine abuse with intoxication and without complication (HCC) 10/08/2014  . Alcohol dependence with withdrawal, uncomplicated (HCC) 10/07/2014  . Cocaine abuse (HCC) 10/07/2014  . Substance induced mood disorder (HCC) 10/07/2014  . Cocaine use disorder, mild, abuse (HCC)   .  Alcohol use disorder, moderate, dependence (HCC)   . Bipolar 1 disorder, depressed, severe (HCC) 06/10/2014  . Obesity (BMI 30.0-34.9) 04/11/2013  . Alcohol dependence (HCC) 11/06/2012  . Cocaine dependence (HCC) 11/06/2012  . Paranoid schizophrenia, chronic condition (HCC) 11/06/2012  . Alcohol abuse 11/05/2012  . S/P cardiac catheterization, 09/14/12 with mild non obstructive CAD and EF of 55% 09/15/2012  . HTN (hypertension), benign 09/12/2012  . DM type 2 (diabetes mellitus, type 2) (HCC) 09/12/2012  . BPH (benign prostatic hyperplasia) 09/12/2012    . Unstable angina, cardiac cath without obstruction 09/12/2012  . Smoker 09/12/2012  . 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 Depression 09/12/2012  . Bipolar disorder (HCC) 09/12/2012  . Dyslipidemia- (LDL 190 in 2011) 09/12/2012    Past Surgical History:  Procedure Laterality Date  . ANTERIOR CERVICAL DECOMP/DISCECTOMY FUSION  2006  . BACK SURGERY    . LEFT HEART CATHETERIZATION WITH CORONARY ANGIOGRAM N/A 09/14/2012   Procedure: LEFT HEART CATHETERIZATION WITH CORONARY ANGIOGRAM;  Surgeon: Thurmon Fair, MD;  Location: [facility] CATH LAB;  Service: Cardiovascular;  Laterality: N/A;        Home Medications    Prior to Admission medications   Medication Sig Start Date End Date Taking? Authorizing Provider  aspirin 81 MG tablet Take 1 tablet (81 mg total) by mouth daily. For heart health Patient taking differently: Take 81 mg by mouth once a week. For heart health 10/12/14  Yes Adonis Brook, NP  benztropine (COGENTIN) 0.5 MG tablet Take 1 tablet (0.5 mg total) by mouth 2 (two) times daily. 10/12/14  Yes Rankin, Shuvon B, NP  escitalopram (LEXAPRO) 20 MG tablet Take 1 tablet (20 mg total) by mouth daily. 10/12/14  Yes Rankin, Shuvon B, NP  haloperidol (HALDOL) 2 MG tablet Take 1 tablet (2 mg total) by mouth at bedtime. 10/12/14  Yes Rankin, Shuvon B, NP  insulin aspart protamine- aspart (NOVOLOG MIX 70/30) (70-30) 100 UNIT/ML injection Inject 20 Units into the skin 2 (two) times daily with a meal.   Yes [provider]  lisinopril (PRINIVIL,ZESTRIL) 20 MG tablet Take 1 tablet (20 mg total) by mouth daily. 10/12/14  Yes Rankin, Shuvon B, NP  metFORMIN (GLUCOPHAGE) 1000 MG tablet Take 1 tablet (1,000 mg total) by mouth 2 (two) times daily with a meal. 10/12/14  Yes Rankin, Shuvon B, NP  atorvastatin (LIPITOR) 40 MG tablet Take 1 tablet (40 mg total) by mouth daily. Patient not taking: Reported on 12/08/2014 10/12/14   Rankin, Shuvon B, NP  doxycycline (VIBRAMYCIN) 100 MG capsule  Take 1 capsule (100 mg total) by mouth 2 (two) times daily. Patient not taking: Reported on 07/08/2017 03/30/17   Linwood Dibbles, MD  fluticasone [facility]) 50 MCG/ACT nasal spray Place 1 spray into both nostrils daily as needed for allergies or rhinitis. Patient not taking: Reported on 09/20/2017 10/12/14   Adonis Brook, NP  gabapentin (NEURONTIN) 100 MG capsule Take 1 capsule (100 mg total) by mouth 3 (three) times daily. Patient not taking: Reported on 12/08/2014 10/12/14   Rankin, Shuvon B, NP  nicotine (NICODERM CQ - DOSED IN MG/24 HOURS) 21 mg/24hr patch Place 1 patch (21 mg total) onto the skin daily. Patient not taking: Reported on 12/08/2014 10/12/14   Rankin, Shuvon B, NP  traMADol (ULTRAM) 50 MG tablet Take 1 tablet (50 mg total) by mouth every 6 (six) hours as needed. Patient not taking: Reported on 07/08/2017 03/30/17   Linwood Dibbles, MD  traZODone (DESYREL) 50 MG tablet Take  1 tablet (50 mg total) by mouth at bedtime. Patient not taking: Reported on 12/08/2014 10/12/14   Rankin, Denice Bors B, NP    Family History Family History  Problem Relation Age of Onset  . Heart disease Brother        has had heart transplant  . Diabetes Mother   . Cancer Mother   . Hypertension Mother   . Cancer Brother   . Diabetes Brother   . Cancer Brother     Social History Social History   Tobacco Use  . Smoking status: Current Every Day Smoker    Packs/day: 0.12    Years: 35.00    Pack years: 4.20    Types: Cigarettes  . Smokeless tobacco: Never Used  Substance Use Topics  . Alcohol use: Yes    Alcohol/week: 23.4 oz    Types: 2 Cans of beer, 37 Shots of liquor per week    Frequency: Never    Comment: 09/21/2017 "3 pints of vodka/wk"  . Drug use: Not Currently    Types: Cocaine    Comment: 09/21/2017 "nothing in the last few weeks"     Allergies   Citrus   Review of Systems Review of Systems  Constitutional: Negative for chills and fever.  HENT: Negative for congestion.   Eyes: Negative for visual  disturbance.  Respiratory: Negative for cough and shortness of breath.   Cardiovascular: Negative for chest pain.  Gastrointestinal: Positive for abdominal pain, diarrhea, nausea and vomiting. Negative for blood in stool.  Genitourinary: Negative for dysuria and hematuria.  Musculoskeletal: Negative for back pain and neck pain.  Skin: Negative for rash.  Neurological: Positive for dizziness, weakness and numbness. Negative for headaches.  Psychiatric/Behavioral: Negative for confusion.  All other systems reviewed and are negative.    Physical Exam Updated Vital Signs BP 134/80   Pulse 71   Temp 99.1 F (37.3 C) (Oral)   Resp 18   Ht  (1.803 m)   Wt 94.3 kg (208 lb)   SpO2 96%   BMI 29.01 kg/m   Physical Exam  Constitutional: He is oriented to person, place, and time. He appears well-developed and well-nourished.  HENT:  Head: Normocephalic and atraumatic.  Mouth/Throat: Oropharynx is clear and moist and mucous membranes are normal.  Eyes: Pupils are equal, round, and reactive to light. Conjunctivae, EOM and lids are normal.  Neck: Full passive range of motion without pain. Carotid bruit is not present.  Cardiovascular: Normal rate, regular rhythm, normal heart sounds and normal pulses. Exam reveals no gallop and no friction rub.  No murmur heard. Pulmonary/Chest: Effort normal and breath sounds normal.  Abdominal: Soft. Normal appearance. There is no tenderness. There is no rigidity and no guarding.  Musculoskeletal: Normal range of motion.  Neurological: He is alert and oriented to person, place, and time.  Cranial nerves III-XII intact Follows commands, Moves all extremities  Reports decreased sensation noted to the left side of face but sensation otherwise intact throughout all other major nerve distributions. 5 out of 5 strength noted BLE.  Slight difference noted in left upper extremity strength and grip. Normal finger to nose. No dysdiadochokinesia. No  pronator drift. No slurred speech. No facial droop.   Skin: Skin is warm and dry. Capillary refill takes less than 2 seconds.  Psychiatric: He has a normal mood and affect. His speech is normal.  Nursing note and vitals reviewed.    ED Treatments / Results  Labs (all labs ordered are listed, but  only abnormal results are displayed) Labs Reviewed  COMPREHENSIVE METABOLIC PANEL - Abnormal; Notable for the following components:      Result Value   CO2 20 (*)    Glucose, Bld 320 (*)    Creatinine, Ser 2.50 (*)    GFR calc non Af Amer 27 (*)    GFR calc Af Amer 31 (*)    All other components within normal limits  URINALYSIS, ROUTINE W REFLEX MICROSCOPIC - Abnormal; Notable for the following components:   Color, Urine AMBER (*)    APPearance CLOUDY (*)    Glucose, UA >=500 (*)    Hgb urine dipstick SMALL (*)    Bilirubin Urine SMALL (*)    Protein, ur 100 (*)    Bacteria, UA RARE (*)    All other components within normal limits  HEMOGLOBIN A1C - Abnormal; Notable for the following components:   Hgb A1c MFr Bld 12.3 (*)    All other components within normal limits  BASIC METABOLIC PANEL - Abnormal; Notable for the following components:   CO2 21 (*)    Glucose, Bld 342 (*)    Creatinine, Ser 1.54 (*)    GFR calc non Af Amer 49 (*)    GFR calc Af Amer 57 (*)    All other components within normal limits  HEPATIC FUNCTION PANEL - Abnormal; Notable for the following components:   Total Protein 6.0 (*)    Albumin 3.3 (*)    All other components within normal limits  MAGNESIUM - Abnormal; Notable for the following components:   Magnesium 1.6 (*)    All other components within normal limits  GLUCOSE, CAPILLARY - Abnormal; Notable for the following components:   Glucose-Capillary 126 (*)    All other components within normal limits  CBG MONITORING, ED - Abnormal; Notable for the following components:   Glucose-Capillary 283 (*)    All other components within normal limits  I-STAT  CHEM 8, ED - Abnormal; Notable for the following components:   Creatinine, Ser 2.40 (*)    Glucose, Bld 317 (*)    TCO2 21 (*)    All other components within normal limits  CBG MONITORING, ED - Abnormal; Notable for the following components:   Glucose-Capillary 232 (*)    All other components within normal limits  CBG MONITORING, ED - Abnormal; Notable for the following components:   Glucose-Capillary 124 (*)    All other components within normal limits  CBG MONITORING, ED - Abnormal; Notable for the following components:   Glucose-Capillary 129 (*)    All other components within normal limits  CBG MONITORING, ED - Abnormal; Notable for the following components:   Glucose-Capillary 259 (*)    All other components within normal limits  PROTIME-INR  APTT  CBC  DIFFERENTIAL  RAPID URINE DRUG SCREEN, HOSP PERFORMED  HIV ANTIBODY (ROUTINE TESTING)  CBC WITH DIFFERENTIAL/PLATELET  TROPONIN I  TROPONIN I  TROPONIN I  SODIUM, URINE, RANDOM  CREATININE, URINE, RANDOM  LIPASE, BLOOD  GLUCOSE, CAPILLARY  BASIC METABOLIC PANEL  CBC  I-STAT TROPONIN, ED    EKG EKG Interpretation  Date/Time:  Sunday Sep 20 2017 16:02:54 EDT Ventricular Rate:  91 PR Interval:  140 QRS Duration: 78 QT Interval:  332 QTC Calculation: 408 R Axis:   -34 Text Interpretation:  Normal sinus rhythm Left axis deviation Nonspecific T wave abnormality Abnormal ECG similar to prior 11/18 Confirmed by Meridee Score [phone]) on 09/20/2017 4:51:17 PM   Radiology Dg Chest  2 View  Result Date: 09/20/2017 CLINICAL DATA:  Chest pain EXAM: CHEST - 2 VIEW COMPARISON:  03/30/2017 FINDINGS: Heart and mediastinal contours are within normal limits. No focal opacities or effusions. No acute bony abnormality. IMPRESSION: No active cardiopulmonary disease. Electronically Signed   By: Charlett Nose M.D.   On: 09/20/2017 16:28   Ct Head Wo Contrast  Result Date: 09/20/2017 CLINICAL DATA:  Occipital headache.  Left facial  numbness. EXAM: CT HEAD WITHOUT CONTRAST TECHNIQUE: Contiguous axial images were obtained from the base of the skull through the vertex without intravenous contrast. COMPARISON:  None. FINDINGS: Brain: No evidence of acute infarction, hemorrhage, hydrocephalus, extra-axial collection or mass lesion/mass effect. Vascular: No hyperdense vessel or unexpected calcification. Skull: Normal. Negative for fracture or focal lesion. Sinuses/Orbits: No acute finding. Other: None. IMPRESSION: 1. Normal noncontrast head CT. Electronically Signed   By: Obie Dredge M.D.   On: 09/20/2017 16:40   Mr Brain Wo Contrast  Result Date: 09/21/2017 CLINICAL DATA:  Initial evaluation for intermittent numbness from base of skull to left jaw all, fatigue. EXAM: MRI HEAD WITHOUT CONTRAST TECHNIQUE: Multiplanar, multiecho pulse sequences of the brain and surrounding structures were obtained without intravenous contrast. COMPARISON:  Prior CT from earlier the same day. FINDINGS: Brain: Generalized age appropriate cerebral atrophy. Few scattered tiny subcentimeter T2/FLAIR hyperintense foci noted involving the supratentorial cerebral white matter, with additional small focus within the right cerebellum, nonspecific, but minimal for age, and felt to be of doubtful clinical significance in the acute setting. No abnormal foci of restricted diffusion to suggest acute or subacute ischemia. Gray-white matter differentiation maintained. No encephalomalacia to suggest chronic infarction. No evidence for acute or chronic intracranial hemorrhage. No mass lesion, midline shift or mass effect. No hydrocephalus. No extra-axial fluid collection. Major dural sinuses are grossly patent. Pituitary gland suprasellar region normal. Midline structures intact and normal. Vascular: Major intracranial vascular flow voids are maintained. Skull and upper cervical spine: Craniocervical junction normal. Postsurgical changes partially visualize within the upper  cervical spine. Bone marrow signal intensity normal. No scalp soft tissue abnormality. Sinuses/Orbits: Globes and orbital soft tissues within normal limits. Mild scattered mucosal thickening within the ethmoidal air cells and maxillary sinuses. Paranasal sinuses are otherwise clear. Small bilateral mastoid effusions noted, slightly larger on the left, of doubtful significance. Inner ear structures normal. Other: None. IMPRESSION: Negative brain MRI.  No acute intracranial abnormality identified. Electronically Signed   By: Rise Mu M.D.   On: 09/21/2017 00:11   Mr Cervical Spine Wo Contrast  Result Date: 09/21/2017 CLINICAL DATA:  Initial evaluation for acute neck pain. History of prior cervical fusion. EXAM: MRI CERVICAL SPINE WITHOUT CONTRAST TECHNIQUE: Multiplanar, multisequence MR imaging of the cervical spine was performed. No intravenous contrast was administered. COMPARISON:  Prior MRI from 04/17/2006. FINDINGS: Alignment: Straightening of the normal cervical lordosis. Slight reversal/kyphotic angulation at the C3-4 level. No listhesis. Vertebrae: Patient status post cervical fusion at C4 through C7. There appears to be solid arthrodesis at these levels. Vertebral body heights maintained without evidence for acute or chronic fracture. Bone marrow signal intensity within normal limits. No discrete or worrisome osseous lesions. Reactive marrow edema about the left C2-3 and right C7-T1 facets due to facet arthritis. Cord: Signal intensity within the cervical spinal cord is normal. Posterior Fossa, vertebral arteries, paraspinal tissues: Visualized brain and posterior fossa within normal limits. Craniocervical junction normal. Paraspinous and prevertebral soft tissues within normal limits. Normal intravascular flow voids present within the vertebral arteries bilaterally. Disc  levels: C2-C3: Mild disc bulge with bilateral uncovertebral hypertrophy. Advanced left-sided facet degeneration. Ligamentum  flavum thickening. No significant canal stenosis. Moderate left C3 foraminal narrowing. C3-C4: Broad multilobulated posterior disc protrusion largely effaces the ventral CSF, abutting the ventral spinal cord. No significant cord deformity. Bilateral uncovertebral hypertrophy, greater on the right. Resultant mild to moderate spinal stenosis. Moderate right with mild left C4 foraminal narrowing. C4-C5:  Status post fusion.  No residual stenosis. C5-C6:  Status post fusion.  No residual stenosis. C6-C7:  Status post fusion.  No residual stenosis. C7-T1: Diffuse disc bulge with bilateral uncovertebral hypertrophy. Superimposed left paracentral disc protrusion indents the ventral thecal sac. Mild flattening of the left hemi cord without cord signal changes. Right greater than left facet hypertrophy. Mild spinal stenosis. Moderate right C8 foraminal narrowing. No significant left foraminal encroachment. T1-2: A left paracentral/foraminal disc protrusion is partially visualized, abutting the left ventral spinal cord and extending into the left neural foramen. No significant spinal stenosis. Moderate to advanced left foraminal narrowing. IMPRESSION: 1. Status posts fusion at C4 through C7 without residual or recurrent canal or foraminal stenosis. 2. Adjacent segment disease at C3-4 with broad posterior disc bulging, resulting in mild to moderate canal stenosis with moderate right C4 foraminal narrowing. 3. Left-sided uncovertebral hypertrophy with facet degeneration at C2-3, resulting in moderate left C3 foraminal stenosis. 4. Left paracentral/foraminal disc protrusion at T1-2 with resultant moderate to advanced left foraminal stenosis. 5. Reactive edema about the left C2-3 and right C7-T1 facets due to facet arthritis. Finding could serve as a source for neck pain. Electronically Signed   By: Rise Mu M.D.   On: 09/21/2017 05:31    Procedures Procedures (including critical care time)  Medications Ordered  in ED Medications  0.9 %  sodium chloride infusion ( Intravenous Transfusing/Transfer 09/21/17 1818)  aspirin chewable tablet 81 mg (81 mg Oral Given 09/21/17 1000)  benztropine (COGENTIN) tablet 0.5 mg (0.5 mg Oral Given 09/21/17 2300)  escitalopram (LEXAPRO) tablet 20 mg (20 mg Oral Given 09/21/17 1000)  insulin aspart protamine- aspart (NOVOLOG MIX 70/30) injection 20 Units (20 Units Subcutaneous Given 09/21/17 2008)  acetaminophen (TYLENOL) tablet 650 mg (has no administration in time range)    Or  acetaminophen (TYLENOL) suppository 650 mg (has no administration in time range)  ondansetron (ZOFRAN) tablet 4 mg (has no administration in time range)    Or  ondansetron (ZOFRAN) injection 4 mg (has no administration in time range)  insulin aspart (novoLOG) injection 0-15 Units (8 Units Subcutaneous Given 09/21/17 1739)  hydrALAZINE (APRESOLINE) injection 10 mg (has no administration in time range)  LORazepam (ATIVAN) tablet 1 mg (has no administration in time range)    Or  LORazepam (ATIVAN) injection 1 mg (has no administration in time range)  thiamine (VITAMIN B-1) tablet 100 mg (100 mg Oral Given 09/21/17 1000)    Or  thiamine (B-1) injection 100 mg ( Intravenous See Alternative 09/21/17 1000)  folic acid (FOLVITE) tablet 1 mg (1 mg Oral Given 09/21/17 1000)  multivitamin with minerals tablet 1 tablet (1 tablet Oral Given 09/21/17 1000)  LORazepam (ATIVAN) tablet 0-4 mg (0 mg Oral Not Given 09/21/17 2301)    Followed by  LORazepam (ATIVAN) tablet 0-4 mg (has no administration in time range)  nitroGLYCERIN (NITROSTAT) SL tablet 0.4 mg (has no administration in time range)  MUSCLE RUB CREA ( Topical Given 09/21/17 2302)  haloperidol (HALDOL) 2 MG/ML solution 2 mg (has no administration in time range)  sodium chloride 0.9 %  bolus 1,000 mL (0 mLs Intravenous Stopped 09/20/17 2128)  sodium chloride 0.9 % bolus 500 mL (0 mLs Intravenous Stopped 09/21/17 0607)     Initial Impression / Assessment and  Plan / ED Course  I have reviewed the triage vital signs and the nursing notes.  Pertinent labs & imaging results that were available during my care of the patient were reviewed by me and considered in my medical decision making (see chart for details).  Clinical Course as of Sep 22 2307  Sun Sep 20, 2017  737 57 year old male said he has been feeling unwell since yesterday afternoon is been lightheaded feeling like he might pass out along with some vague chest pain and some numbness and pain in the left side of his face arm leg along with some difficulty with the speech.  Seems like most of the stuff is improved he does not have any large deficits right now.  His labs are coming back with an elevated creatinine but a normal troponin normal head CT.  Giving him some fluids and reviewing this with neurology ordering an MRI brain.   [MB]    Clinical Course User Index [MB] Terrilee Files, MD    57 year old male with past medical history of bipolar, depression, diabetes, hypertension and dyslipidemia who presents for evaluation of multiple complaints.  Reports that yesterday, he started developing some left upper face numbness, left upper extremity weakness, lower extremity weakness.  Says upper extremity was greater than lower.  Reports that he had some difficulty speaking.  Also reports that he was having dizziness that he describes as being off balance.  Also states that he started having some left-sided chest pain.  He had been having some diffuse abdominal pain and some diarrhea.  Reports multiple episodes of nonbloody diarrhea.  No vomiting.  No fevers.  Came today because symptoms were not improving.  Reports when he walks he feels off balance. Patient is afebrile, non-toxic appearing, sitting comfortably on examination table. Vital signs reviewed and stable.  Patient reports change in sensation on left upper extremity.  No facial weakness that I can see.  He has slight diminished grip strength  in the left upper extremity but otherwise no weakness.  Bilateral lower remedies are symmetric in appearance.  Abdomen is diffusely tender with no focal point.  Consider CVA versus electrolyte imbalance versus infectious etiology.  Initial labs, imaging ordered at triage.  Labs show creatinine is 2.50.  Last creatinine was in  07/08/17 and at that time was 0.97.  Glucose is 320.  Bicarb is 20.  Anion gap is 12.  PT/INR within normal limits.  Troponin negative.  CT head unremarkable.  Chest x-ray negative for any acute abnormality.  Discussed with Dr. Wilford Corner (Neuro).  Agrees with plan for MRI brain without contrast.  Given diarrhea and acute AKI, will need admission.  MRI is pending at this time.  Discussed with hospitalist.  Will plan to admit.   Final Clinical Impressions(s) / ED Diagnoses   Final diagnoses:  Acute kidney injury (HCC)  Weakness of left upper extremity  Paresthesia    ED Discharge Orders    None       Rosana Hoes 09/21/17 2309    Terrilee Files, MD 09/22/17 1455

## 2017-09-20 NOTE — ED Triage Notes (Signed)
Pt c/o intermittent numbess from base of skull to L jaw onset yesterday, intermittent L sided chest discomfort, Diarrhea x 8 since yesterday.  Pt c/o extreme fatigue.pt reports at times he is having difficulty finding his words. HA onset today. Speech sounds slightly slurred in triage. No extremity weakness noted.

## 2017-09-20 NOTE — ED Notes (Signed)
Patient transported to MRI 

## 2017-09-21 ENCOUNTER — Other Ambulatory Visit: Payer: Self-pay

## 2017-09-21 ENCOUNTER — Observation Stay (HOSPITAL_COMMUNITY): Payer: Medicaid Other

## 2017-09-21 ENCOUNTER — Encounter (HOSPITAL_COMMUNITY): Payer: Self-pay | Admitting: Internal Medicine

## 2017-09-21 DIAGNOSIS — R079 Chest pain, unspecified: Secondary | ICD-10-CM

## 2017-09-21 DIAGNOSIS — N179 Acute kidney failure, unspecified: Secondary | ICD-10-CM | POA: Diagnosis present

## 2017-09-21 DIAGNOSIS — F17210 Nicotine dependence, cigarettes, uncomplicated: Secondary | ICD-10-CM | POA: Diagnosis present

## 2017-09-21 DIAGNOSIS — E1165 Type 2 diabetes mellitus with hyperglycemia: Secondary | ICD-10-CM | POA: Diagnosis present

## 2017-09-21 DIAGNOSIS — Z794 Long term (current) use of insulin: Secondary | ICD-10-CM | POA: Diagnosis not present

## 2017-09-21 DIAGNOSIS — E785 Hyperlipidemia, unspecified: Secondary | ICD-10-CM | POA: Diagnosis present

## 2017-09-21 DIAGNOSIS — F200 Paranoid schizophrenia: Secondary | ICD-10-CM | POA: Diagnosis present

## 2017-09-21 DIAGNOSIS — R200 Anesthesia of skin: Secondary | ICD-10-CM | POA: Diagnosis present

## 2017-09-21 DIAGNOSIS — F314 Bipolar disorder, current episode depressed, severe, without psychotic features: Secondary | ICD-10-CM | POA: Diagnosis present

## 2017-09-21 DIAGNOSIS — F1010 Alcohol abuse, uncomplicated: Secondary | ICD-10-CM | POA: Diagnosis present

## 2017-09-21 DIAGNOSIS — Z833 Family history of diabetes mellitus: Secondary | ICD-10-CM | POA: Diagnosis not present

## 2017-09-21 DIAGNOSIS — R531 Weakness: Secondary | ICD-10-CM | POA: Diagnosis present

## 2017-09-21 DIAGNOSIS — Z8249 Family history of ischemic heart disease and other diseases of the circulatory system: Secondary | ICD-10-CM | POA: Diagnosis not present

## 2017-09-21 DIAGNOSIS — I10 Essential (primary) hypertension: Secondary | ICD-10-CM | POA: Diagnosis present

## 2017-09-21 DIAGNOSIS — I2510 Atherosclerotic heart disease of native coronary artery without angina pectoris: Secondary | ICD-10-CM | POA: Diagnosis present

## 2017-09-21 DIAGNOSIS — T464X5A Adverse effect of angiotensin-converting-enzyme inhibitors, initial encounter: Secondary | ICD-10-CM | POA: Diagnosis present

## 2017-09-21 HISTORY — DX: Anesthesia of skin: R20.0

## 2017-09-21 HISTORY — DX: Chest pain, unspecified: R07.9

## 2017-09-21 LAB — CBC WITH DIFFERENTIAL/PLATELET
Abs Immature Granulocytes: 0 10*3/uL (ref 0.0–0.1)
Basophils Absolute: 0 10*3/uL (ref 0.0–0.1)
Basophils Relative: 0 %
EOS ABS: 0.1 10*3/uL (ref 0.0–0.7)
EOS PCT: 1 %
HEMATOCRIT: 43 % (ref 39.0–52.0)
Hemoglobin: 14.7 g/dL (ref 13.0–17.0)
IMMATURE GRANULOCYTES: 0 %
LYMPHS ABS: 2.6 10*3/uL (ref 0.7–4.0)
Lymphocytes Relative: 37 %
MCH: 30 pg (ref 26.0–34.0)
MCHC: 34.2 g/dL (ref 30.0–36.0)
MCV: 87.8 fL (ref 78.0–100.0)
MONO ABS: 0.7 10*3/uL (ref 0.1–1.0)
MONOS PCT: 9 %
Neutro Abs: 3.6 10*3/uL (ref 1.7–7.7)
Neutrophils Relative %: 53 %
Platelets: 219 10*3/uL (ref 150–400)
RBC: 4.9 MIL/uL (ref 4.22–5.81)
RDW: 12.3 % (ref 11.5–15.5)
WBC: 7 10*3/uL (ref 4.0–10.5)

## 2017-09-21 LAB — HEPATIC FUNCTION PANEL
ALK PHOS: 56 U/L (ref 38–126)
ALT: 18 U/L (ref 17–63)
AST: 17 U/L (ref 15–41)
Albumin: 3.3 g/dL — ABNORMAL LOW (ref 3.5–5.0)
BILIRUBIN DIRECT: 0.2 mg/dL (ref 0.1–0.5)
BILIRUBIN TOTAL: 0.8 mg/dL (ref 0.3–1.2)
Indirect Bilirubin: 0.6 mg/dL (ref 0.3–0.9)
Total Protein: 6 g/dL — ABNORMAL LOW (ref 6.5–8.1)

## 2017-09-21 LAB — HEMOGLOBIN A1C
Hgb A1c MFr Bld: 12.3 % — ABNORMAL HIGH (ref 4.8–5.6)
Mean Plasma Glucose: 306.31 mg/dL

## 2017-09-21 LAB — BASIC METABOLIC PANEL
ANION GAP: 11 (ref 5–15)
BUN: 9 mg/dL (ref 6–20)
CO2: 21 mmol/L — AB (ref 22–32)
Calcium: 9 mg/dL (ref 8.9–10.3)
Chloride: 104 mmol/L (ref 101–111)
Creatinine, Ser: 1.54 mg/dL — ABNORMAL HIGH (ref 0.61–1.24)
GFR calc Af Amer: 57 mL/min — ABNORMAL LOW (ref >60)
GFR calc non Af Amer: 49 mL/min — ABNORMAL LOW (ref >60)
GLUCOSE: 342 mg/dL — AB (ref 65–99)
POTASSIUM: 3.7 mmol/L (ref 3.5–5.1)
Sodium: 136 mmol/L (ref 135–145)

## 2017-09-21 LAB — GLUCOSE, CAPILLARY
GLUCOSE-CAPILLARY: 126 mg/dL — AB (ref 65–99)
Glucose-Capillary: 96 mg/dL (ref 65–99)

## 2017-09-21 LAB — CREATININE, URINE, RANDOM: Creatinine, Urine: 377.93 mg/dL

## 2017-09-21 LAB — CBG MONITORING, ED
GLUCOSE-CAPILLARY: 129 mg/dL — AB (ref 65–99)
Glucose-Capillary: 124 mg/dL — ABNORMAL HIGH (ref 65–99)
Glucose-Capillary: 259 mg/dL — ABNORMAL HIGH (ref 65–99)

## 2017-09-21 LAB — LIPASE, BLOOD: Lipase: 34 U/L (ref 11–51)

## 2017-09-21 LAB — TROPONIN I
Troponin I: <0.03 ng/mL (ref <0.03)
Troponin I: <0.03 ng/mL (ref <0.03)

## 2017-09-21 LAB — SODIUM, URINE, RANDOM: SODIUM UR: 26 mmol/L

## 2017-09-21 LAB — HIV ANTIBODY (ROUTINE TESTING W REFLEX): HIV Screen 4th Generation wRfx: NONREACTIVE

## 2017-09-21 LAB — MAGNESIUM: Magnesium: 1.6 mg/dL — ABNORMAL LOW (ref 1.7–2.4)

## 2017-09-21 MED ORDER — ACETAMINOPHEN 325 MG PO TABS: 650.0000 mg | ORAL_TABLET | Freq: Four times a day (QID) | ORAL | Status: DC | PRN

## 2017-09-21 MED ORDER — VITAMIN B-1 100 MG PO TABS
100.0000 mg | ORAL_TABLET | Freq: Every day | ORAL | Status: DC
  Administered 2017-09-21 – 2017-09-22 (×2): 100 mg via ORAL
  Filled 2017-09-21 (×2): qty 1

## 2017-09-21 MED ORDER — ENOXAPARIN SODIUM 40 MG/0.4ML ~~LOC~~ SOLN
40.0000 mg | SUBCUTANEOUS | Status: DC
  Administered 2017-09-21: 40 mg via SUBCUTANEOUS
  Filled 2017-09-21: qty 0.4

## 2017-09-21 MED ORDER — SODIUM CHLORIDE 0.9 % IV BOLUS
500.0000 mL | Freq: Once | INTRAVENOUS | Status: AC
  Administered 2017-09-21: 500 mL via INTRAVENOUS

## 2017-09-21 MED ORDER — ESCITALOPRAM OXALATE 20 MG PO TABS
20.0000 mg | ORAL_TABLET | Freq: Every day | ORAL | Status: DC
  Administered 2017-09-21 – 2017-09-22 (×2): 20 mg via ORAL
  Filled 2017-09-21: qty 1
  Filled 2017-09-21: qty 2

## 2017-09-21 MED ORDER — LORAZEPAM 1 MG PO TABS: 0.0000 mg | ORAL_TABLET | Freq: Two times a day (BID) | ORAL | Status: DC

## 2017-09-21 MED ORDER — FOLIC ACID 1 MG PO TABS
1.0000 mg | ORAL_TABLET | Freq: Every day | ORAL | Status: DC
  Administered 2017-09-21 – 2017-09-22 (×2): 1 mg via ORAL
  Filled 2017-09-21 (×2): qty 1

## 2017-09-21 MED ORDER — HALOPERIDOL 2 MG PO TABS
2.0000 mg | ORAL_TABLET | Freq: Every day | ORAL | Status: DC
  Filled 2017-09-21 (×2): qty 1

## 2017-09-21 MED ORDER — ACETAMINOPHEN 650 MG RE SUPP: 650.0000 mg | Freq: Four times a day (QID) | RECTAL | Status: DC | PRN

## 2017-09-21 MED ORDER — THIAMINE HCL 100 MG/ML IJ SOLN: 100.0000 mg | Freq: Every day | INTRAMUSCULAR | Status: DC

## 2017-09-21 MED ORDER — NITROGLYCERIN 0.4 MG SL SUBL
0.4000 mg | SUBLINGUAL_TABLET | SUBLINGUAL | Status: DC | PRN
Start: 2017-09-21 — End: 2017-09-22

## 2017-09-21 MED ORDER — BENZTROPINE MESYLATE 0.5 MG PO TABS
0.5000 mg | ORAL_TABLET | Freq: Two times a day (BID) | ORAL | Status: DC
  Administered 2017-09-21 – 2017-09-22 (×4): 0.5 mg via ORAL
  Filled 2017-09-21 (×5): qty 1

## 2017-09-21 MED ORDER — SODIUM CHLORIDE 0.9 % IV SOLN
INTRAVENOUS | Status: AC
  Administered 2017-09-21: 03:00:00 via INTRAVENOUS

## 2017-09-21 MED ORDER — LORAZEPAM 1 MG PO TABS: 1.0000 mg | ORAL_TABLET | Freq: Four times a day (QID) | ORAL | Status: DC | PRN

## 2017-09-21 MED ORDER — LORAZEPAM 2 MG/ML IJ SOLN: 1.0000 mg | Freq: Four times a day (QID) | INTRAMUSCULAR | Status: DC | PRN

## 2017-09-21 MED ORDER — ASPIRIN 81 MG PO CHEW
81.0000 mg | CHEWABLE_TABLET | Freq: Every day | ORAL | Status: DC
  Administered 2017-09-21 – 2017-09-22 (×2): 81 mg via ORAL
  Filled 2017-09-21 (×2): qty 1

## 2017-09-21 MED ORDER — LORAZEPAM 1 MG PO TABS: 0.0000 mg | ORAL_TABLET | Freq: Four times a day (QID) | ORAL | Status: DC

## 2017-09-21 MED ORDER — HALOPERIDOL LACTATE 2 MG/ML PO CONC
2.0000 mg | Freq: Every day | ORAL | Status: DC
  Administered 2017-09-21: 2 mg via ORAL
  Filled 2017-09-21 (×2): qty 1

## 2017-09-21 MED ORDER — MUSCLE RUB 10-15 % EX CREA
TOPICAL_CREAM | Freq: Two times a day (BID) | CUTANEOUS | Status: DC
  Administered 2017-09-21 – 2017-09-22 (×2): via TOPICAL
  Filled 2017-09-21: qty 85

## 2017-09-21 MED ORDER — ONDANSETRON HCL 4 MG/2ML IJ SOLN: 4.0000 mg | Freq: Four times a day (QID) | INTRAMUSCULAR | Status: DC | PRN

## 2017-09-21 MED ORDER — HYDRALAZINE HCL 20 MG/ML IJ SOLN: 10.0000 mg | INTRAMUSCULAR | Status: DC | PRN

## 2017-09-21 MED ORDER — CAPSAICIN 0.025 % EX CREA
TOPICAL_CREAM | Freq: Two times a day (BID) | CUTANEOUS | Status: DC
  Filled 2017-09-21: qty 60

## 2017-09-21 MED ORDER — INSULIN ASPART PROT & ASPART (70-30 MIX) 100 UNIT/ML ~~LOC~~ SUSP
20.0000 [IU] | Freq: Two times a day (BID) | SUBCUTANEOUS | Status: DC
  Administered 2017-09-21 – 2017-09-22 (×3): 20 [IU] via SUBCUTANEOUS
  Filled 2017-09-21 (×3): qty 10

## 2017-09-21 MED ORDER — ONDANSETRON HCL 4 MG PO TABS: 4.0000 mg | ORAL_TABLET | Freq: Four times a day (QID) | ORAL | Status: DC | PRN

## 2017-09-21 MED ORDER — ADULT MULTIVITAMIN W/MINERALS CH
1.0000 | ORAL_TABLET | Freq: Every day | ORAL | Status: DC
  Administered 2017-09-21 – 2017-09-22 (×2): 1 via ORAL
  Filled 2017-09-21 (×2): qty 1

## 2017-09-21 MED ORDER — INSULIN ASPART 100 UNIT/ML ~~LOC~~ SOLN
0.0000 [IU] | Freq: Three times a day (TID) | SUBCUTANEOUS | Status: DC
  Administered 2017-09-21: 8 [IU] via SUBCUTANEOUS
  Administered 2017-09-21: 2 [IU] via SUBCUTANEOUS
  Filled 2017-09-21 (×2): qty 1

## 2017-09-21 NOTE — H&P (Addendum)
History and Physical    Eduardo French:[id number] DOB: 11-Dec-1960 DOA: 09/20/2017  PCP: [facility]  Patient coming from: Home.  Chief Complaint: Left-sided facial numbness.  HPI: Eduardo French is a 57 y.o. male with history of alcohol abuse, bipolar disorder, hypertension, diabetes mellitus, hyperlipidemia presents to the ER with multiple complaints including left-sided facial numbness with numbness extending to the left upper and lower extremity since yesterday afternoon.  At the same time patient had multiple episodes of diarrhea with no vomiting.  Denies any abdominal pain.  Patient also had some retrosternal chest pain stabbing in nature lasting for few seconds each time.  Denies any headache visual symptoms difficulty speaking or swallowing.  ED Course: In the ER on exam patient appears nonfocal.  Patient complains of left facial numbness like a pain radiating from the base of the head to the jaw.  Also complains of some numbness in the left upper and lower extremity.  Has good strength in all extremities.  Presently chest pain-free.  MRI brain was unremarkable.  Neurologist recommended no further stroke work-up at this time.  Patient's labs show acute renal failure.  Patient has been having multiple episodes of diarrhea since yesterday.  Probably causing the acute renal failure.  Patient was given fluid bolus and started on a fluid infusion.  EKG shows nothing acute troponin was negative patient admitted for further observation.  Review of Systems: As per HPI, rest all negative.   Past Medical History:  Diagnosis Date  . Bipolar disorder (HCC)   . Depression   . Diabetes mellitus without complication (HCC)   . DJD (degenerative joint disease) of cervical spine    on disability  . Hypertension   . S/P cardiac catheterization, 09/14/12 with mild non obstructive CAD and EF of 55% 09/15/2012    Past Surgical History:  Procedure Laterality Date  .  BACK SURGERY    . LEFT HEART CATHETERIZATION WITH CORONARY ANGIOGRAM N/A 09/14/2012   Procedure: LEFT HEART CATHETERIZATION WITH CORONARY ANGIOGRAM;  Surgeon: Thurmon Fair, MD;  Location: [facility] CATH LAB;  Service: Cardiovascular;  Laterality: N/A;     reports that he has been smoking cigarettes.  He has a 25.00 pack-year smoking history. He has never used smokeless tobacco. He reports that he does not drink alcohol or use drugs.  Allergies  Allergen Reactions  . Citrus Hives and Rash    Pt states as long as he doesn't get too much into his system, can have about once a week.    Family History  Problem Relation Age of Onset  . Heart disease Brother        has had heart transplant  . Diabetes Mother   . Cancer Mother   . Hypertension Mother   . Cancer Brother   . Diabetes Brother   . Cancer Brother     Prior to Admission medications   Medication Sig Start Date End Date Taking? Authorizing Provider  aspirin 81 MG tablet Take 1 tablet (81 mg total) by mouth daily. For heart health Patient taking differently: Take 81 mg by mouth once a week. For heart health 10/12/14  Yes Adonis Brook, NP  benztropine (COGENTIN) 0.5 MG tablet Take 1 tablet (0.5 mg total) by mouth 2 (two) times daily. 10/12/14  Yes Rankin, Shuvon B, NP  escitalopram (LEXAPRO) 20 MG tablet Take 1 tablet (20 mg total) by mouth daily. 10/12/14  Yes Rankin, Shuvon B, NP  haloperidol (HALDOL) 2 MG  tablet Take 1 tablet (2 mg total) by mouth at bedtime. 10/12/14  Yes Rankin, Shuvon B, NP  insulin aspart protamine- aspart (NOVOLOG MIX 70/30) (70-30) 100 UNIT/ML injection Inject 20 Units into the skin 2 (two) times daily with a meal.   Yes [provider]  lisinopril (PRINIVIL,ZESTRIL) 20 MG tablet Take 1 tablet (20 mg total) by mouth daily. 10/12/14  Yes Rankin, Shuvon B, NP  metFORMIN (GLUCOPHAGE) 1000 MG tablet Take 1 tablet (1,000 mg total) by mouth 2 (two) times daily with a meal. 10/12/14  Yes Rankin, Shuvon B, NP    atorvastatin (LIPITOR) 40 MG tablet Take 1 tablet (40 mg total) by mouth daily. Patient not taking: Reported on 12/08/2014 10/12/14   Rankin, Shuvon B, NP  doxycycline (VIBRAMYCIN) 100 MG capsule Take 1 capsule (100 mg total) by mouth 2 (two) times daily. Patient not taking: Reported on 07/08/2017 03/30/17   Linwood Dibbles, MD  fluticasone [facility]) 50 MCG/ACT nasal spray Place 1 spray into both nostrils daily as needed for allergies or rhinitis. Patient not taking: Reported on 09/20/2017 10/12/14   Adonis Brook, NP  gabapentin (NEURONTIN) 100 MG capsule Take 1 capsule (100 mg total) by mouth 3 (three) times daily. Patient not taking: Reported on 12/08/2014 10/12/14   Rankin, Shuvon B, NP  nicotine (NICODERM CQ - DOSED IN MG/24 HOURS) 21 mg/24hr patch Place 1 patch (21 mg total) onto the skin daily. Patient not taking: Reported on 12/08/2014 10/12/14   Rankin, Shuvon B, NP  traMADol (ULTRAM) 50 MG tablet Take 1 tablet (50 mg total) by mouth every 6 (six) hours as needed. Patient not taking: Reported on 07/08/2017 03/30/17   Linwood Dibbles, MD  traZODone (DESYREL) 50 MG tablet Take 1 tablet (50 mg total) by mouth at bedtime. Patient not taking: Reported on 12/08/2014 10/12/14   Assunta Found B, NP    Physical Exam: Vitals:   09/20/17 2330 09/21/17 0000 09/21/17 0100 09/21/17 0232  BP: 110/68 128/81 133/77 132/85  Pulse: 72 81 71 81  Resp: 18 18 (!) 23 17  Temp:      TempSrc:      SpO2: 98% 94% 96% 98%  Weight:      Height:          Constitutional: Moderately built and nourished. Vitals:   09/20/17 2330 09/21/17 0000 09/21/17 0100 09/21/17 0232  BP: 110/68 128/81 133/77 132/85  Pulse: 72 81 71 81  Resp: 18 18 (!) 23 17  Temp:      TempSrc:      SpO2: 98% 94% 96% 98%  Weight:      Height:       Eyes: Anicteric no pallor. ENMT: No discharge from the ears eyes nose or mouth. Neck: No mass palpated no neck rigidity.  No JVD appreciated. Respiratory: No rhonchi or crepitations. Cardiovascular: S1-S2  heard no murmurs appreciated. Abdomen: Soft nontender bowel sounds present. Musculoskeletal: No edema.  No joint effusion. Skin: No rash.  Skin appears warm. Neurologic: Alert awake oriented to time place and person.  Moves all extremities 5 x 5.  No facial asymmetry tongue is midline.  Pupils equal and reacting to light. Psychiatric: Appears normal.  Normal affect.   Labs on Admission: I have personally reviewed following labs and imaging studies  CBC: Recent Labs  Lab 09/20/17 1549 09/20/17 1559  WBC 8.3  --   NEUTROABS 5.7  --   HGB 15.9 16.3  HCT 46.2 48.0  MCV 86.8  --   PLT  249  --    Basic Metabolic Panel: Recent Labs  Lab 09/20/17 1549 09/20/17 1559  NA 135 136  K 3.7 3.6  CL 103 101  CO2 20*  --   GLUCOSE 320* 317*  BUN 10 11  CREATININE 2.50* 2.40*  CALCIUM 10.0  --    GFR: Estimated Creatinine Clearance: 40.3 mL/min (A) (by C-G formula based on SCr of 2.4 mg/dL (H)). Liver Function Tests: Recent Labs  Lab 09/20/17 1549  AST 20  ALT 22  ALKPHOS 72  BILITOT 1.0  PROT 7.4  ALBUMIN 4.3   No results for input(s): LIPASE, AMYLASE in the last 168 hours. No results for input(s): AMMONIA in the last 168 hours. Coagulation Profile: Recent Labs  Lab 09/20/17 1549  INR 0.94   Cardiac Enzymes: No results for input(s): CKTOTAL, CKMB, CKMBINDEX, TROPONINI in the last 168 hours. BNP (last 3 results) No results for input(s): PROBNP in the last 8760 hours. HbA1C: No results for input(s): HGBA1C in the last 72 hours. CBG: Recent Labs  Lab 09/20/17 1559 09/20/17 2104  GLUCAP 283* 232*   Lipid Profile: No results for input(s): CHOL, HDL, LDLCALC, TRIG, CHOLHDL, LDLDIRECT in the last 72 hours. Thyroid Function Tests: No results for input(s): TSH, T4TOTAL, FREET4, T3FREE, THYROIDAB in the last 72 hours. Anemia Panel: No results for input(s): VITAMINB12, FOLATE, FERRITIN, TIBC, IRON, RETICCTPCT in the last 72 hours. Urine analysis:    Component Value  Date/Time   COLORURINE AMBER (A) 09/20/2017 1736   APPEARANCEUR CLOUDY (A) 09/20/2017 1736   LABSPEC 1.018 09/20/2017 1736   PHURINE 5.0 09/20/2017 1736   GLUCOSEU >=500 (A) 09/20/2017 1736   HGBUR SMALL (A) 09/20/2017 1736   BILIRUBINUR SMALL (A) 09/20/2017 1736   KETONESUR NEGATIVE 09/20/2017 1736   PROTEINUR 100 (A) 09/20/2017 1736   UROBILINOGEN 0.2 12/08/2014 1324   NITRITE NEGATIVE 09/20/2017 1736   LEUKOCYTESUR NEGATIVE 09/20/2017 1736   Sepsis Labs: (procalcitonin:4,lacticidven:4) )No results found for this or any previous visit (from the past 240 hour(s)).   Radiological Exams on Admission: Dg Chest 2 View  Result Date: 09/20/2017 CLINICAL DATA:  Chest pain EXAM: CHEST - 2 VIEW COMPARISON:  03/30/2017 FINDINGS: Heart and mediastinal contours are within normal limits. No focal opacities or effusions. No acute bony abnormality. IMPRESSION: No active cardiopulmonary disease. Electronically Signed   By: Charlett Nose M.D.   On: 09/20/2017 16:28   Ct Head Wo Contrast  Result Date: 09/20/2017 CLINICAL DATA:  Occipital headache.  Left facial numbness. EXAM: CT HEAD WITHOUT CONTRAST TECHNIQUE: Contiguous axial images were obtained from the base of the skull through the vertex without intravenous contrast. COMPARISON:  None. FINDINGS: Brain: No evidence of acute infarction, hemorrhage, hydrocephalus, extra-axial collection or mass lesion/mass effect. Vascular: No hyperdense vessel or unexpected calcification. Skull: Normal. Negative for fracture or focal lesion. Sinuses/Orbits: No acute finding. Other: None. IMPRESSION: 1. Normal noncontrast head CT. Electronically Signed   By: Obie Dredge M.D.   On: 09/20/2017 16:40   Mr Brain Wo Contrast  Result Date: 09/21/2017 CLINICAL DATA:  Initial evaluation for intermittent numbness from base of skull to left jaw all, fatigue. EXAM: MRI HEAD WITHOUT CONTRAST TECHNIQUE: Multiplanar, multiecho pulse sequences of the brain and  surrounding structures were obtained without intravenous contrast. COMPARISON:  Prior CT from earlier the same day. FINDINGS: Brain: Generalized age appropriate cerebral atrophy. Few scattered tiny subcentimeter T2/FLAIR hyperintense foci noted involving the supratentorial cerebral white matter, with additional small focus within the right cerebellum, nonspecific,  but minimal for age, and felt to be of doubtful clinical significance in the acute setting. No abnormal foci of restricted diffusion to suggest acute or subacute ischemia. Gray-white matter differentiation maintained. No encephalomalacia to suggest chronic infarction. No evidence for acute or chronic intracranial hemorrhage. No mass lesion, midline shift or mass effect. No hydrocephalus. No extra-axial fluid collection. Major dural sinuses are grossly patent. Pituitary gland suprasellar region normal. Midline structures intact and normal. Vascular: Major intracranial vascular flow voids are maintained. Skull and upper cervical spine: Craniocervical junction normal. Postsurgical changes partially visualize within the upper cervical spine. Bone marrow signal intensity normal. No scalp soft tissue abnormality. Sinuses/Orbits: Globes and orbital soft tissues within normal limits. Mild scattered mucosal thickening within the ethmoidal air cells and maxillary sinuses. Paranasal sinuses are otherwise clear. Small bilateral mastoid effusions noted, slightly larger on the left, of doubtful significance. Inner ear structures normal. Other: None. IMPRESSION: Negative brain MRI.  No acute intracranial abnormality identified. Electronically Signed   By: Rise Mu M.D.   On: 09/21/2017 00:11    EKG: Independently reviewed.  Normal sinus rhythm with nonspecific ST-T changes.  Assessment/Plan Principal Problem:   ARF (acute renal failure) (HCC) Active Problems:   HTN (hypertension), benign   DM type 2 (diabetes mellitus, type 2) (HCC)   S/P cardiac  catheterization, 09/14/12 with mild non obstructive CAD and EF of 55%   Paranoid schizophrenia, chronic condition (HCC)   Bipolar 1 disorder, depressed, severe (HCC)   Alcohol abuse with intoxication (HCC)   Chest pain   Left sided numbness    1. Acute renal failure -creatinine has worsened from March 2019 of 0.9-2.5 this admission.  Likely precipitated by diarrhea and patient using lisinopril.  We will hold lisinopril continue with hydration closely follow intake output metabolic panel.  Patient is not oliguric at this time and if there is no improvement in creatinine will check imaging to rule out any obstruction.  Urine does show hyaline and granular casts.  No RBCs seen. 2. Left-sided facial numbness with left upper extremity and lower extremity numbness.  MRI brain was negative neurologist has recommended no further stroke work-up.  Will check MRI C-spine. 3. Chest pain appears atypical.  Patient states he has had cardiac cath in 2014.  We will cycle cardiac markers check 2D echo.  Presently chest pain-free. 4. Diabetes mellitus type 2 uncontrolled.  Has not taken his insulin last 24 hours.  Will continue with hydration I have dosed his home dose of insulin now.  If metabolic panel shows any further worsening of anion gap may need insulin infusion. 5. Hypertension -  since patient is off lisinopril due to renal failure, I have placed patient on PRN IV hydralazine. 6. Alcohol abuse placed on CIWA protocol. 7. History of paranoid schizophrenia and bipolar disorder on Haldol and Cogentin. 8. Hyperlipidemia on statins.   DVT prophylaxis: Lovenox. Code Status: Full code. Family Communication: Discussed with patient. Disposition Plan: Home. Consults called: Discussed with neurologist. Admission status: Observation.   Eduard Clos MD [facility] Pager [phone].  If 7PM-7AM, please contact night-coverage [email or web address] Password [email or web address]  09/21/2017, 2:57 AM

## 2017-09-21 NOTE — ED Notes (Signed)
Pt's CBG result was 259. Informed Arlys John - RN.

## 2017-09-21 NOTE — Progress Notes (Signed)
Patient admitted after midnight, please see H&P.  Cr improving.  Facial symptoms are not constant, now coming and going.  C/o muscle pain in upper back. Hope for d/c in AM if Cr normal  Marlin Canary DO

## 2017-09-21 NOTE — ED Notes (Signed)
Pt placed in hospital bed for comfort.

## 2017-09-21 NOTE — Progress Notes (Signed)
New Admission Note: Patient admitted to room 5M03 from [facility] ED  Arrival Method: via wheelchair Mental Orientation: alert and oriented x 4 Telemetry: Placed on box 03 Assessment: Completed Skin: Intact IV: Infusing Pain: Denies Tubes: None Safety Measures: Safety Fall Prevention Plan has been discussed  Admission: To be completed 5 [place] Orientation: Patient has been orientated to the room, unit and staff.   Family: None at bedside  Orders to be reviewed and implemented. Will continue to monitor the patient. Call light has been placed within reach and bed alarm has been activated.   Burley Saver, BSN, RN-BC Phone: [phone]

## 2017-09-22 DIAGNOSIS — F314 Bipolar disorder, current episode depressed, severe, without psychotic features: Secondary | ICD-10-CM

## 2017-09-22 DIAGNOSIS — E1165 Type 2 diabetes mellitus with hyperglycemia: Secondary | ICD-10-CM

## 2017-09-22 DIAGNOSIS — Z794 Long term (current) use of insulin: Secondary | ICD-10-CM

## 2017-09-22 LAB — CBC
HEMATOCRIT: 41 % (ref 39.0–52.0)
HEMOGLOBIN: 13.9 g/dL (ref 13.0–17.0)
MCH: 29.9 pg (ref 26.0–34.0)
MCHC: 33.9 g/dL (ref 30.0–36.0)
MCV: 88.2 fL (ref 78.0–100.0)
PLATELETS: 220 10*3/uL (ref 150–400)
RBC: 4.65 MIL/uL (ref 4.22–5.81)
RDW: 12.4 % (ref 11.5–15.5)
WBC: 5.4 10*3/uL (ref 4.0–10.5)

## 2017-09-22 LAB — BASIC METABOLIC PANEL
Anion gap: 8 (ref 5–15)
BUN: 9 mg/dL (ref 6–20)
CHLORIDE: 108 mmol/L (ref 101–111)
CO2: 24 mmol/L (ref 22–32)
CREATININE: 1.14 mg/dL (ref 0.61–1.24)
Calcium: 8.7 mg/dL — ABNORMAL LOW (ref 8.9–10.3)
GFR calc non Af Amer: >60 mL/min (ref >60)
Glucose, Bld: 125 mg/dL — ABNORMAL HIGH (ref 65–99)
POTASSIUM: 3.5 mmol/L (ref 3.5–5.1)
SODIUM: 140 mmol/L (ref 135–145)

## 2017-09-22 LAB — GLUCOSE, CAPILLARY: Glucose-Capillary: 116 mg/dL — ABNORMAL HIGH (ref 65–99)

## 2017-09-22 MED ORDER — MUSCLE RUB 10-15 % EX CREA: 1.0000 "application " | TOPICAL_CREAM | Freq: Two times a day (BID) | CUTANEOUS | 0 refills | Status: DC

## 2017-09-22 MED ORDER — FOLIC ACID 1 MG PO TABS: 1.0000 mg | ORAL_TABLET | Freq: Every day | ORAL | 0 refills | Status: DC

## 2017-09-22 MED ORDER — THIAMINE HCL 100 MG PO TABS: 100.0000 mg | ORAL_TABLET | Freq: Every day | ORAL | 0 refills | Status: DC

## 2017-09-22 NOTE — Progress Notes (Signed)
Patient Discharge: Disposition: Patient discharged to home. Education: Reviewed medications, prescriptions, follow-up appointments and discharge instructions, verbalized understanding. IV: Discontinued IV before discharge. Telemetry: Discontinued Tele before discharge. Transportation: Patient escorted out of the unit in w/c. Belongings: Patient took all his belongings with him.  

## 2017-09-22 NOTE — Discharge Summary (Signed)
Physician Discharge Summary  Eduardo French WJX:[id number] DOB: [birth year] DOA: 09/20/2017  PCP: [facility]  Admit date: 09/20/2017 Discharge date: 09/22/2017  Admitted From: home Discharge disposition: home   Recommendations for Outpatient Follow-Up:   1. Alcohol cessation 2. BMP at next office visit 3. Close monitoring of his glucose for better blood sugar control 4. Lisinopril on hold for CR   Discharge Diagnosis:   Principal Problem:   ARF (acute renal failure) (HCC) Active Problems:   HTN (hypertension), benign   DM type 2 (diabetes mellitus, type 2) (HCC)   S/P cardiac catheterization, 09/14/12 with mild non obstructive CAD and EF of 55%   Paranoid schizophrenia, chronic condition (HCC)   Bipolar 1 disorder, depressed, severe (HCC)   Alcohol abuse with intoxication (HCC)   Chest pain   Left sided numbness    Discharge Condition: Improved.  Diet recommendation: Low sodium, heart healthy.  Carbohydrate-modified  Wound care: None.  Code status: Full.   History of Present Illness:   Eduardo French is a 57 y.o. male with history of alcohol abuse, bipolar disorder, hypertension, diabetes mellitus, hyperlipidemia presents to the ER with multiple complaints including left-sided facial numbness with numbness extending to the left upper and lower extremity since yesterday afternoon.  At the same time patient had multiple episodes of diarrhea with no vomiting.  Denies any abdominal pain.  Patient also had some retrosternal chest pain stabbing in nature lasting for few seconds each time.  Denies any headache visual symptoms difficulty speaking or swallowing.  ED Course: In the ER on exam patient appears nonfocal.  Patient complains of left facial numbness like a pain radiating from the base of the head to the jaw.  Also complains of some numbness in the left upper and lower extremity.  Has good strength in all extremities.  Presently  chest pain-free.  MRI brain was unremarkable.  Neurologist recommended no further stroke work-up at this time.  Patient's labs show acute renal failure.  Patient has been having multiple episodes of diarrhea since yesterday.  Probably causing the acute renal failure.  Patient was given fluid bolus and started on a fluid infusion.  EKG shows nothing acute troponin was negative patient admitted for further observation.     Hospital Course by Problem:    Acute renal failure - -resolved with IVF -monitor as an outpatient  Left-sided facial numbness with left upper extremity and lower extremity numbness.   -MRI brain was negative neurologist has recommended no further stroke work-up.   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 -CE negative  Diabetes mellitus type 2 uncontrolled. -  Has not taken his insulin last 24 hours.  -stressed compliance and close follow up  Hypertension  -resume lisinopril at next PCP visit if Cr stable and BP elevated  Alcohol abuse -no sign of withdrawal  History of paranoid schizophrenia and bipolar disorder on Haldol and Cogentin.  Hyperlipidemia on statins.      Medical Consultants:   Neuro (phone with ER)   Discharge Exam:   Vitals:   09/22/17 0402 09/22/17 0737  BP: (!) 142/88 140/85  Pulse: (!) 56 (!) 59  Resp: 18 18  Temp: 98.3 F (36.8 C) 98 F (36.7 C)  SpO2: 97% 95%   Vitals:   09/21/17 1816 09/21/17 2137 09/22/17 0402 09/22/17 0737  BP: (!) 146/91 134/80 (!) 142/88 140/85  Pulse: 65 71 (!) 56 (!)  59  Resp: Temp: 97.7 F (36.5 C) 99.1 F (37.3 C) 98.3 F (36.8 C) 98 F (36.7 C)  TempSrc: Oral Oral Oral Oral  SpO2: 98% 96% 97% 95%  Weight:      Height:        General exam: Appears calm and comfortable. -symptoms have resolved  The results of significant diagnostics from this hospitalization (including imaging, microbiology, ancillary and laboratory) are  listed below for reference.     Procedures and Diagnostic Studies:   Dg Chest 2 View  Result Date: 09/20/2017 CLINICAL DATA:  Chest pain EXAM: CHEST - 2 VIEW COMPARISON:  03/30/2017 FINDINGS: Heart and mediastinal contours are within normal limits. No focal opacities or effusions. No acute bony abnormality. IMPRESSION: No active cardiopulmonary disease. Electronically Signed   By: Charlett Nose M.D.   On: 09/20/2017 16:28   Ct Head Wo Contrast  Result Date: 09/20/2017 CLINICAL DATA:  Occipital headache.  Left facial numbness. EXAM: CT HEAD WITHOUT CONTRAST TECHNIQUE: Contiguous axial images were obtained from the base of the skull through the vertex without intravenous contrast. COMPARISON:  None. FINDINGS: Brain: No evidence of acute infarction, hemorrhage, hydrocephalus, extra-axial collection or mass lesion/mass effect. Vascular: No hyperdense vessel or unexpected calcification. Skull: Normal. Negative for fracture or focal lesion. Sinuses/Orbits: No acute finding. Other: None. IMPRESSION: 1. Normal noncontrast head CT. Electronically Signed   By: Obie Dredge M.D.   On: 09/20/2017 16:40   Mr Brain Wo Contrast  Result Date: 09/21/2017 CLINICAL DATA:  Initial evaluation for intermittent numbness from base of skull to left jaw all, fatigue. EXAM: MRI HEAD WITHOUT CONTRAST TECHNIQUE: Multiplanar, multiecho pulse sequences of the brain and surrounding structures were obtained without intravenous contrast. COMPARISON:  Prior CT from earlier the same day. FINDINGS: Brain: Generalized age appropriate cerebral atrophy. Few scattered tiny subcentimeter T2/FLAIR hyperintense foci noted involving the supratentorial cerebral white matter, with additional small focus within the right cerebellum, nonspecific, but minimal for age, and felt to be of doubtful clinical significance in the acute setting. No abnormal foci of restricted diffusion to suggest acute or subacute ischemia. Gray-white matter  differentiation maintained. No encephalomalacia to suggest chronic infarction. No evidence for acute or chronic intracranial hemorrhage. No mass lesion, midline shift or mass effect. No hydrocephalus. No extra-axial fluid collection. Major dural sinuses are grossly patent. Pituitary gland suprasellar region normal. Midline structures intact and normal. Vascular: Major intracranial vascular flow voids are maintained. Skull and upper cervical spine: Craniocervical junction normal. Postsurgical changes partially visualize within the upper cervical spine. Bone marrow signal intensity normal. No scalp soft tissue abnormality. Sinuses/Orbits: Globes and orbital soft tissues within normal limits. Mild scattered mucosal thickening within the ethmoidal air cells and maxillary sinuses. Paranasal sinuses are otherwise clear. Small bilateral mastoid effusions noted, slightly larger on the left, of doubtful significance. Inner ear structures normal. Other: None. IMPRESSION: Negative brain MRI.  No acute intracranial abnormality identified. Electronically Signed   By: Rise Mu M.D.   On: 09/21/2017 00:11   Mr Cervical Spine Wo Contrast  Result Date: 09/21/2017 CLINICAL DATA:  Initial evaluation for acute neck pain. History of prior cervical fusion. EXAM: MRI CERVICAL SPINE WITHOUT CONTRAST TECHNIQUE: Multiplanar, multisequence MR imaging of the cervical spine was performed. No intravenous contrast was administered. COMPARISON:  Prior MRI from 04/17/2006. FINDINGS: Alignment: Straightening of the normal cervical lordosis. Slight reversal/kyphotic angulation at the C3-4 level. No listhesis. Vertebrae: Patient status post cervical fusion at C4 through C7. There  appears to be solid arthrodesis at these levels. Vertebral body heights maintained without evidence for acute or chronic fracture. Bone marrow signal intensity within normal limits. No discrete or worrisome osseous lesions. Reactive marrow edema about the left  C2-3 and right C7-T1 facets due to facet arthritis. Cord: Signal intensity within the cervical spinal cord is normal. Posterior Fossa, vertebral arteries, paraspinal tissues: Visualized brain and posterior fossa within normal limits. Craniocervical junction normal. Paraspinous and prevertebral soft tissues within normal limits. Normal intravascular flow voids present within the vertebral arteries bilaterally. Disc levels: C2-C3: Mild disc bulge with bilateral uncovertebral hypertrophy. Advanced left-sided facet degeneration. Ligamentum flavum thickening. No significant canal stenosis. Moderate left C3 foraminal narrowing. C3-C4: Broad multilobulated posterior disc protrusion largely effaces the ventral CSF, abutting the ventral spinal cord. No significant cord deformity. Bilateral uncovertebral hypertrophy, greater on the right. Resultant mild to moderate spinal stenosis. Moderate right with mild left C4 foraminal narrowing. C4-C5:  Status post fusion.  No residual stenosis. C5-C6:  Status post fusion.  No residual stenosis. C6-C7:  Status post fusion.  No residual stenosis. C7-T1: Diffuse disc bulge with bilateral uncovertebral hypertrophy. Superimposed left paracentral disc protrusion indents the ventral thecal sac. Mild flattening of the left hemi cord without cord signal changes. Right greater than left facet hypertrophy. Mild spinal stenosis. Moderate right C8 foraminal narrowing. No significant left foraminal encroachment. T1-2: A left paracentral/foraminal disc protrusion is partially visualized, abutting the left ventral spinal cord and extending into the left neural foramen. No significant spinal stenosis. Moderate to advanced left foraminal narrowing. IMPRESSION: 1. Status posts fusion at C4 through C7 without residual or recurrent canal or foraminal stenosis. 2. Adjacent segment disease at C3-4 with broad posterior disc bulging, resulting in mild to moderate canal stenosis with moderate right C4 foraminal  narrowing. 3. Left-sided uncovertebral hypertrophy with facet degeneration at C2-3, resulting in moderate left C3 foraminal stenosis. 4. Left paracentral/foraminal disc protrusion at T1-2 with resultant moderate to advanced left foraminal stenosis. 5. Reactive edema about the left C2-3 and right C7-T1 facets due to facet arthritis. Finding could serve as a source for neck pain. Electronically Signed   By: Rise Mu M.D.   On: 09/21/2017 05:31     Labs:   Basic Metabolic Panel: Recent Labs  Lab 09/20/17 1549 09/20/17 1559 09/21/17 0320 09/22/17 0521  NA 135 136 136 140  K 3.7 3.6 3.7 3.5  CL 103 101 104 108  CO2 20*  --  21* 24  GLUCOSE 320* 317* 342* 125*  BUN CREATININE 2.50* 2.40* 1.54* 1.14  CALCIUM 10.0  --  9.0 8.7*  MG  --   --  1.6*  --    GFR Estimated Creatinine Clearance: 84.8 mL/min (by C-G formula based on SCr of 1.14 mg/dL). Liver Function Tests: Recent Labs  Lab 09/20/17 1549 09/21/17 0320  AST 20 17  ALT 22 18  ALKPHOS 72 56  BILITOT 1.0 0.8  PROT 7.4 6.0*  ALBUMIN 4.3 3.3*   Recent Labs  Lab 09/21/17 0320  LIPASE 34   No results for input(s): AMMONIA in the last 168 hours. Coagulation profile Recent Labs  Lab 09/20/17 1549  INR 0.94    CBC: Recent Labs  Lab 09/20/17 1549 09/20/17 1559 09/21/17 0320 09/22/17 0521  WBC 8.3  --  7.0 5.4  NEUTROABS 5.7  --  3.6  --   HGB 15.9 16.3 14.7 13.9  HCT 46.2 48.0 43.0 41.0  MCV 86.8  --  87.8 88.2  PLT 249  --  219 220   Cardiac Enzymes: Recent Labs  Lab 09/21/17 0320 09/21/17 0851 09/21/17 1851  TROPONINI <0.03 <0.03 <0.03   BNP: Invalid input(s): POCBNP CBG: Recent Labs  Lab 09/21/17 1225 09/21/17 1723 09/21/17 2007 09/21/17 2138 09/22/17 0736  GLUCAP 129* 259* 126* 96 116*   D-Dimer No results for input(s): DDIMER in the last 72 hours. Hgb A1c Recent Labs    09/21/17 0320  HGBA1C 12.3*   Lipid Profile No results for input(s): CHOL, HDL, LDLCALC,  TRIG, CHOLHDL, LDLDIRECT in the last 72 hours. Thyroid function studies No results for input(s): TSH, T4TOTAL, T3FREE, THYROIDAB in the last 72 hours.  Invalid input(s): FREET3 Anemia work up No results for input(s): VITAMINB12, FOLATE, FERRITIN, TIBC, IRON, RETICCTPCT in the last 72 hours. Microbiology No results found for this or any previous visit (from the past 240 hour(s)).   Discharge Instructions:   Discharge Instructions    Diet - low sodium heart healthy   Complete by:  As directed    Diet Carb Modified   Complete by:  As directed    Discharge instructions   Complete by:  As directed    Alcohol cessation BMP and possible resumption of lisinopril at next PCP visit   Increase activity slowly   Complete by:  As directed      Allergies as of 09/22/2017      Reactions   Citrus Hives, Rash   Pt states as long as he doesn't get too much into his system, can have about once a week.      Medication List    STOP taking these medications   doxycycline 100 MG capsule Commonly known as:  VIBRAMYCIN   gabapentin 100 MG capsule Commonly known as:  NEURONTIN   lisinopril 20 MG tablet Commonly known as:  PRINIVIL,ZESTRIL   nicotine 21 mg/24hr patch Commonly known as:  NICODERM CQ - dosed in mg/24 hours   traMADol 50 MG tablet Commonly known as:  ULTRAM   traZODone 50 MG tablet Commonly known as:  DESYREL     TAKE these medications   aspirin 81 MG tablet Take 1 tablet (81 mg total) by mouth daily. For heart health What changed:    when to take this  additional instructions   atorvastatin 40 MG tablet Commonly known as:  LIPITOR Take 1 tablet (40 mg total) by mouth daily.   benztropine 0.5 MG tablet Commonly known as:  COGENTIN Take 1 tablet (0.5 mg total) by mouth 2 (two) times daily.   escitalopram 20 MG tablet Commonly known as:  LEXAPRO Take 1 tablet (20 mg total) by mouth daily.   fluticasone 50 MCG/ACT nasal spray Commonly known as:   FLONASE Place 1 spray into both nostrils daily as needed for allergies or rhinitis.   folic acid 1 MG tablet Commonly known as:  FOLVITE Take 1 tablet (1 mg total) by mouth daily.   haloperidol 2 MG tablet Commonly known as:  HALDOL Take 1 tablet (2 mg total) by mouth at bedtime.   insulin aspart protamine- aspart (70-30) 100 UNIT/ML injection Commonly known as:  NOVOLOG MIX 70/30 Inject 20 Units into the skin 2 (two) times daily with a meal.   metFORMIN 1000 MG tablet Commonly known as:  GLUCOPHAGE Take 1 tablet (1,000 mg total) by mouth 2 (two) times daily with a meal.   MUSCLE RUB 10-15 % Crea Apply 1 application topically 2 (two) times daily.   thiamine  100 MG tablet Take 1 tablet (100 mg total) by mouth daily.      Follow-up Information    [facility] Follow up.   Why:  keep current appointment Contact information: [address] [place] [address] [phone]            Time coordinating discharge: 35 min  Signed:  Joseph Art  [facility] 09/22/2017, 8:57 AM

## 2017-10-14 ENCOUNTER — Emergency Department (HOSPITAL_COMMUNITY)
Admission: EM | Admit: 2017-10-14 | Discharge: 2017-10-14 | Disposition: A | Discharge status: Home / Self Care | Payer: Medicaid Other | Attending: Emergency Medicine | Admitting: Emergency Medicine

## 2017-10-14 ENCOUNTER — Encounter (HOSPITAL_COMMUNITY): Payer: Self-pay | Admitting: Emergency Medicine

## 2017-10-14 DIAGNOSIS — R252 Cramp and spasm: Secondary | ICD-10-CM | POA: Diagnosis not present

## 2017-10-14 DIAGNOSIS — F17210 Nicotine dependence, cigarettes, uncomplicated: Secondary | ICD-10-CM | POA: Diagnosis not present

## 2017-10-14 DIAGNOSIS — I10 Essential (primary) hypertension: Secondary | ICD-10-CM | POA: Insufficient documentation

## 2017-10-14 DIAGNOSIS — E1165 Type 2 diabetes mellitus with hyperglycemia: Secondary | ICD-10-CM | POA: Insufficient documentation

## 2017-10-14 DIAGNOSIS — Z7982 Long term (current) use of aspirin: Secondary | ICD-10-CM | POA: Diagnosis not present

## 2017-10-14 DIAGNOSIS — Z794 Long term (current) use of insulin: Secondary | ICD-10-CM | POA: Insufficient documentation

## 2017-10-14 DIAGNOSIS — R748 Abnormal levels of other serum enzymes: Secondary | ICD-10-CM | POA: Diagnosis not present

## 2017-10-14 DIAGNOSIS — Z79899 Other long term (current) drug therapy: Secondary | ICD-10-CM | POA: Diagnosis not present

## 2017-10-14 DIAGNOSIS — M62838 Other muscle spasm: Secondary | ICD-10-CM

## 2017-10-14 LAB — BASIC METABOLIC PANEL
ANION GAP: 14 (ref 5–15)
BUN: 24 mg/dL — ABNORMAL HIGH (ref 6–20)
CALCIUM: 9.3 mg/dL (ref 8.9–10.3)
CO2: 23 mmol/L (ref 22–32)
Chloride: 97 mmol/L — ABNORMAL LOW (ref 101–111)
Creatinine, Ser: 1.45 mg/dL — ABNORMAL HIGH (ref 0.61–1.24)
GFR, EST NON AFRICAN AMERICAN: 52 mL/min — AB (ref >60)
Glucose, Bld: 337 mg/dL — ABNORMAL HIGH (ref 65–99)
POTASSIUM: 4 mmol/L (ref 3.5–5.1)
Sodium: 134 mmol/L — ABNORMAL LOW (ref 135–145)

## 2017-10-14 LAB — CBG MONITORING, ED
GLUCOSE-CAPILLARY: 272 mg/dL — AB (ref 65–99)
Glucose-Capillary: 376 mg/dL — ABNORMAL HIGH (ref 65–99)

## 2017-10-14 LAB — CK: Total CK: 446 U/L — ABNORMAL HIGH (ref 49–397)

## 2017-10-14 LAB — PHOSPHORUS: Phosphorus: 2.9 mg/dL (ref 2.5–4.6)

## 2017-10-14 LAB — MAGNESIUM: Magnesium: 2 mg/dL (ref 1.7–2.4)

## 2017-10-14 MED ORDER — SODIUM CHLORIDE 0.9 % IV BOLUS
1000.0000 mL | Freq: Once | INTRAVENOUS | Status: AC
  Administered 2017-10-14: 1000 mL via INTRAVENOUS

## 2017-10-14 MED ORDER — CYCLOBENZAPRINE HCL 10 MG PO TABS: 10.0000 mg | ORAL_TABLET | Freq: Three times a day (TID) | ORAL | 0 refills | Status: DC | PRN

## 2017-10-14 MED ORDER — CYCLOBENZAPRINE HCL 10 MG PO TABS
10.0000 mg | ORAL_TABLET | Freq: Once | ORAL | Status: AC
  Administered 2017-10-14: 10 mg via ORAL
  Filled 2017-10-14: qty 1

## 2017-10-14 NOTE — Discharge Instructions (Addendum)
Use flexeril as directed as needed for muscle cramps/spasms. Do not drive or operate machinery with muscle relaxant use. Stay very well hydrated with water or gatorade zero (having 8 cups of 8oz of water/gatorade zero daily for the next few days) and get plenty of rest. You may use heat to areas of soreness, no more than 20 minutes at a time every hour. Avoid doing any heavy lifting or intense physical exertion for the next few days. Follow up with your primary care doctor in 3-5 days for recheck of symptoms and ongoing management of your condition. Return to the ER for emergent changes or worsening symptoms.

## 2017-10-14 NOTE — ED Triage Notes (Signed)
[place] [facility] [place] c/o bilat hand cramping which is radiating up arms that started about hour and half ago. Hx of cramping but was told not take calcium anymore. Reports never lasted this long. Vitals: 168/80.  CBG 395, 90HR.

## 2017-10-14 NOTE — ED Provider Notes (Signed)
[facility]-EMERGENCY DEPT Provider Note   CSN: [id number] Arrival date & time: 10/14/17  1806     History   Chief Complaint Chief Complaint  Patient presents with  . hand cramping    HPI Eduardo French is a 57 y.o. male with a PMHx of CKD, arthritis, chronic headaches, bipolar disorder, DJD, HLD, HTN, DM2, seizures, polysubstance abuse, and other conditions listed below, who presents to the ED with complaints of bilateral hand cramping for the last 3 hours.  Patient states that he has had issues with cramping before, but usually goes away.  For about 3 hours he had bilateral hand cramping that extends more into his arms, worsens with activity or movement of his hands, and has been unrelieved with ibuprofen.  He states that he tighten bolts on patio furniture earlier today but denies that it was very difficult at all and denies that there was any significant exertion or her force applied in order to tighten the bolts.  He has not done anything else that he can recall that would have caused his cramping.  He states that he has been trying to stay hydrated because few weeks ago he was admitted for dehydration.  He denies neck/back pain, fevers, chills, CP, SOB, abd pain, N/V/D/C, hematuria, dysuria, decreased UOP, focal arthralgias, numbness, tingling, focal weakness, or any other complaints at this time.   The history is provided by the patient and medical records. No language interpreter was used.    Past Medical History:  Diagnosis Date  . ARF (acute renal failure) (HCC) 09/20/2017  . Arthritis    "right knee, left hip, right elbow, between my shoulder blades, neck" (09/21/2017)  . Bipolar disorder (HCC)   . Chronic headache    "in the back of my neck; only in the winter time" (09/21/2017)  . Chronic neck pain   . Chronic shoulder pain    "between shoulder blades" (09/21/2017)  . Depression   . DJD (degenerative joint disease) of cervical spine    on disability    . High cholesterol   . History of stomach ulcers 1980s   "they healed"  . Hypertension   . Pneumonia    "maybe twice since childhood" (09/21/2017)  . S/P cardiac catheterization, 09/14/12 with mild non obstructive CAD and EF of 55% 09/15/2012  . Seizures (HCC)    "diabetic seizures when they first tried to get my RX right" (09/21/2017)  . Type II diabetes mellitus [facility](HCC)     Patient Active Problem List   Diagnosis Date Noted  . ARF (acute renal failure) (HCC) 09/21/2017  . Chest pain 09/21/2017  . Left sided numbness 09/21/2017  . Alcohol abuse with intoxication (HCC) 10/08/2014  . Cocaine abuse with intoxication and without complication (HCC) 10/08/2014  . Alcohol dependence with withdrawal, uncomplicated (HCC) 10/07/2014  . Cocaine abuse (HCC) 10/07/2014  . Substance induced mood disorder (HCC) 10/07/2014  . Cocaine use disorder, mild, abuse (HCC)   . Alcohol use disorder, moderate, dependence (HCC)   . Bipolar 1 disorder, depressed, severe (HCC) 06/10/2014  . Obesity (BMI 30.0-34.9) 04/11/2013  . Alcohol dependence (HCC) 11/06/2012  . Cocaine dependence (HCC) 11/06/2012  . Paranoid schizophrenia, chronic condition (HCC) 11/06/2012  . Alcohol abuse 11/05/2012  . S/P cardiac catheterization, 09/14/12 with mild non obstructive CAD and EF of 55% 09/15/2012  . HTN (hypertension), benign 09/12/2012  . DM type 2 (diabetes mellitus, type 2) (HCC) 09/12/2012  . BPH (benign prostatic hyperplasia) 09/12/2012  . Unstable  angina, cardiac cath without obstruction 09/12/2012  . Smoker 09/12/2012  . 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 Depression 09/12/2012  . Bipolar disorder (HCC) 09/12/2012  . Dyslipidemia- (LDL 190 in 2011) 09/12/2012    Past Surgical History:  Procedure Laterality Date  . ANTERIOR CERVICAL DECOMP/DISCECTOMY FUSION  2006  . BACK SURGERY    . LEFT HEART CATHETERIZATION WITH CORONARY ANGIOGRAM N/A 09/14/2012   Procedure: LEFT HEART CATHETERIZATION  WITH CORONARY ANGIOGRAM;  Surgeon: Thurmon Fair, MD;  Location: [facility] CATH LAB;  Service: Cardiovascular;  Laterality: N/A;        Home Medications    Prior to Admission medications   Medication Sig Start Date End Date Taking? Authorizing Provider  aspirin 81 MG tablet Take 1 tablet (81 mg total) by mouth daily. For heart health Patient taking differently: Take 81 mg by mouth once a week. For heart health 10/12/14   Adonis Brook, NP  atorvastatin (LIPITOR) 40 MG tablet Take 1 tablet (40 mg total) by mouth daily. Patient not taking: Reported on 12/08/2014 10/12/14   Rankin, Shuvon B, NP  benztropine (COGENTIN) 0.5 MG tablet Take 1 tablet (0.5 mg total) by mouth 2 (two) times daily. 10/12/14   Rankin, Shuvon B, NP  escitalopram (LEXAPRO) 20 MG tablet Take 1 tablet (20 mg total) by mouth daily. 10/12/14   Rankin, Shuvon B, NP  fluticasone (FLONASE) 50 MCG/ACT nasal spray Place 1 spray into both nostrils daily as needed for allergies or rhinitis. Patient not taking: Reported on 09/20/2017 10/12/14   Adonis Brook, NP  folic acid (FOLVITE) 1 MG tablet Take 1 tablet (1 mg total) by mouth daily. 09/22/17   Joseph Art, DO  haloperidol (HALDOL) 2 MG tablet Take 1 tablet (2 mg total) by mouth at bedtime. 10/12/14   Rankin, Shuvon B, NP  insulin aspart protamine- aspart (NOVOLOG MIX 70/30) (70-30) 100 UNIT/ML injection Inject 20 Units into the skin 2 (two) times daily with a meal.    [provider]  Menthol-Methyl Salicylate (MUSCLE RUB) 10-15 % CREA Apply 1 application topically 2 (two) times daily. 09/22/17   Joseph Art, DO  metFORMIN (GLUCOPHAGE) 1000 MG tablet Take 1 tablet (1,000 mg total) by mouth 2 (two) times daily with a meal. 10/12/14   Rankin, Shuvon B, NP  thiamine 100 MG tablet Take 1 tablet (100 mg total) by mouth daily. 09/22/17   Joseph Art, DO    Family History Family History  Problem Relation Age of Onset  . Heart disease Brother        has had heart transplant  .  Diabetes Mother   . Cancer Mother   . Hypertension Mother   . Cancer Brother   . Diabetes Brother   . Cancer Brother     Social History Social History   Tobacco Use  . Smoking status: Current Every Day Smoker    Packs/day: 0.12    Years: 35.00    Pack years: 4.20    Types: Cigarettes  . Smokeless tobacco: Never Used  Substance Use Topics  . Alcohol use: Yes    Alcohol/week: 23.4 oz    Types: 2 Cans of beer, 37 Shots of liquor per week    Frequency: Never    Comment: 09/21/2017 "3 pints of vodka/wk"  . Drug use: Not Currently    Types: Cocaine    Comment: 09/21/2017 "nothing in the last few weeks"     Allergies   Citrus   Review  of Systems Review of Systems  Constitutional: Negative for chills and fever.  Respiratory: Negative for shortness of breath.   Cardiovascular: Negative for chest pain.  Gastrointestinal: Negative for abdominal pain, constipation, diarrhea, nausea and vomiting.  Genitourinary: Negative for decreased urine volume, dysuria and hematuria.  Musculoskeletal: Positive for myalgias (b/l hand cramping). Negative for arthralgias, back pain and neck pain.  Skin: Negative for color change.  Allergic/Immunologic: Positive for immunocompromised state (DM2).  Neurological: Negative for weakness and numbness.  Psychiatric/Behavioral: Negative for confusion.   All other systems reviewed and are negative for acute change except as noted in the HPI.    Physical Exam Updated Vital Signs BP 139/87 (BP Location: Left Arm)   Pulse 90   Temp 99.1 F (37.3 C) (Oral)   Resp 18   SpO2 97%    Physical Exam  Constitutional: He is oriented to person, place, and time. Vital signs are normal. He appears well-developed and well-nourished.  Non-toxic appearance. No distress.  Afebrile, nontoxic, NAD  HENT:  Head: Normocephalic and atraumatic.  Mouth/Throat: Oropharynx is clear and moist and mucous membranes are normal.  Eyes: Conjunctivae and EOM are normal. Right  eye exhibits no discharge. Left eye exhibits no discharge.  Neck: Normal range of motion. Neck supple. No spinous process tenderness and no muscular tenderness present. No neck rigidity. Normal range of motion present.  FROM intact without spinous process TTP, no bony stepoffs or deformities, no paraspinous muscle TTP or muscle spasms. No rigidity or meningeal signs. No bruising or swelling.   Cardiovascular: Normal rate, regular rhythm, normal heart sounds and intact distal pulses. Exam reveals no gallop and no friction rub.  No murmur heard. Pulmonary/Chest: Effort normal and breath sounds normal. No respiratory distress. He has no decreased breath sounds. He has no wheezes. He has no rhonchi. He has no rales.  Abdominal: Soft. Normal appearance and bowel sounds are normal. He exhibits no distension. There is no tenderness. There is no rigidity, no rebound, no guarding, no CVA tenderness, no tenderness at McBurney's point and negative Murphy's sign.  Musculoskeletal: Normal range of motion.  B/l hands with intermittent spasms of the hand musculature, doesn't seem to extend into the wrist/forearm musculature; spasms last a few seconds before resolving. Once spasm stops, pt able to use both hands, FROM intact in all joints of the hands/wrist/arms, no focal bony/joint TTP, no crepitus or deformity, no overlying skin changes, strength and sensation grossly intact, distal pulses intact, soft compartments. No spinal TTP or bony stepoffs/deformities.   Neurological: He is alert and oriented to person, place, and time. He has normal strength. No sensory deficit.  Skin: Skin is warm, dry and intact. No rash noted.  Psychiatric: He has a normal mood and affect.  Nursing note and vitals reviewed.    ED Treatments / Results  Labs (all labs ordered are listed, but only abnormal results are displayed) Labs Reviewed  BASIC METABOLIC PANEL - Abnormal; Notable for the following components:      Result Value    Sodium 134 (*)    Chloride 97 (*)    Glucose, Bld 337 (*)    BUN 24 (*)    Creatinine, Ser 1.45 (*)    GFR calc non Af Amer 52 (*)    All other components within normal limits  CK - Abnormal; Notable for the following components:   Total CK 446 (*)    All other components within normal limits  CBG MONITORING, ED - Abnormal; Notable for  the following components:   Glucose-Capillary 376 (*)    All other components within normal limits  CBG MONITORING, ED - Abnormal; Notable for the following components:   Glucose-Capillary 272 (*)    All other components within normal limits  MAGNESIUM  PHOSPHORUS    EKG None  Radiology No results found.  Procedures Procedures (including critical care time)  Medications Ordered in ED Medications  sodium chloride 0.9 % bolus 1,000 mL (1,000 mLs Intravenous New Bag/Given 10/14/17 2047)  cyclobenzaprine (FLEXERIL) tablet 10 mg (10 mg Oral Given 10/14/17 2048)     Initial Impression / Assessment and Plan / ED Course  I have reviewed the triage vital signs and the nursing notes.  Pertinent labs & imaging results that were available during my care of the patient were reviewed by me and considered in my medical decision making (see chart for details).     57 y.o. male here with b/l hand cramping x3 hrs. Has had issues with hand cramping in the past as well. On exam, intermittent cramping of the hands which last a few seconds and appear to be spasms of the muscles; when the spasm stops, pt able to use hands and strength is symmetric; sensation grossly intact, no focal area of tenderness, FROM intact in all joints of the hands/wrist/arms. NVI with soft compartments. Work up thus far reveals: BMP with gluc 337 (recheck CBG at 8pm shows gluc down to 272), BUN 24/Cr 1.45 which is fairly stable from prior values and consistent with prior baseline (Cr 1.0-1.4 over the last several years), and otherwise remainder of BMP fairly unremarkable. Will get CK, Mg  and phos levels, give fluids and flexeril, and reassess shortly.   10:22 PM Mg level WNL. Phos level WNL. CK slightly elevated at 446. Pt feeling better and cramps/spasms have stopped; pt able to use hands and not have any cramps/spasms at this time. Doubt true rhabdomyolysis at this low level of CK elevation, especially given lack of extreme muscular exertion; doubt need for admission, fluids were given here, doubt need for further IV hydration at this time. Will send home with flexeril, advised staying very well hydrated, and f/up with PCP in 3-5 days for recheck of symptoms. Discussed case with my attending Dr. Clayborne Dana who agrees with plan. I explained the diagnosis and have given explicit precautions to return to the ER including for any other new or worsening symptoms. The patient understands and accepts the medical plan as it's been dictated and I have answered their questions. Discharge instructions concerning home care and prescriptions have been given. The patient is STABLE and is discharged to home in good condition.    Final Clinical Impressions(s) / ED Diagnoses   Final diagnoses:  Hand cramps  Spasm of muscle  Type 2 diabetes mellitus with hyperglycemia, with long-term current use of insulin (HCC)  Elevated CK    ED Discharge Orders        Ordered    cyclobenzaprine (FLEXERIL) 10 MG tablet  3 times daily PRN     10/14/17 [address], [place], [place] 10/14/17 2223    Mesner, Barbara Cower, MD 10/14/17 2242

## 2017-12-14 ENCOUNTER — Encounter (HOSPITAL_COMMUNITY): Payer: Self-pay | Admitting: Emergency Medicine

## 2017-12-14 ENCOUNTER — Emergency Department (HOSPITAL_COMMUNITY)
Admission: EM | Admit: 2017-12-14 | Discharge: 2017-12-14 | Disposition: A | Discharge status: Home / Self Care | Payer: Medicaid Other | Attending: Emergency Medicine | Admitting: Emergency Medicine

## 2017-12-14 ENCOUNTER — Emergency Department (HOSPITAL_COMMUNITY): Payer: Medicaid Other

## 2017-12-14 DIAGNOSIS — Y9389 Activity, other specified: Secondary | ICD-10-CM | POA: Insufficient documentation

## 2017-12-14 DIAGNOSIS — I10 Essential (primary) hypertension: Secondary | ICD-10-CM | POA: Insufficient documentation

## 2017-12-14 DIAGNOSIS — F17210 Nicotine dependence, cigarettes, uncomplicated: Secondary | ICD-10-CM | POA: Diagnosis not present

## 2017-12-14 DIAGNOSIS — Z23 Encounter for immunization: Secondary | ICD-10-CM | POA: Insufficient documentation

## 2017-12-14 DIAGNOSIS — W278XXA Contact with other nonpowered hand tool, initial encounter: Secondary | ICD-10-CM | POA: Insufficient documentation

## 2017-12-14 DIAGNOSIS — Z7984 Long term (current) use of oral hypoglycemic drugs: Secondary | ICD-10-CM | POA: Diagnosis not present

## 2017-12-14 DIAGNOSIS — Y929 Unspecified place or not applicable: Secondary | ICD-10-CM | POA: Insufficient documentation

## 2017-12-14 DIAGNOSIS — Y999 Unspecified external cause status: Secondary | ICD-10-CM | POA: Diagnosis not present

## 2017-12-14 DIAGNOSIS — E119 Type 2 diabetes mellitus without complications: Secondary | ICD-10-CM | POA: Insufficient documentation

## 2017-12-14 DIAGNOSIS — S61211A Laceration without foreign body of left index finger without damage to nail, initial encounter: Secondary | ICD-10-CM | POA: Diagnosis not present

## 2017-12-14 DIAGNOSIS — Z7982 Long term (current) use of aspirin: Secondary | ICD-10-CM | POA: Diagnosis not present

## 2017-12-14 DIAGNOSIS — Z79899 Other long term (current) drug therapy: Secondary | ICD-10-CM | POA: Diagnosis not present

## 2017-12-14 MED ORDER — CEPHALEXIN 500 MG PO CAPS: 500.0000 mg | ORAL_CAPSULE | Freq: Two times a day (BID) | ORAL | 0 refills | Status: AC

## 2017-12-14 MED ORDER — BUPIVACAINE HCL 0.5 % IJ SOLN: 10.0000 mL | Freq: Once | INTRAMUSCULAR | Status: DC

## 2017-12-14 MED ORDER — BACITRACIN ZINC 500 UNIT/GM EX OINT
TOPICAL_OINTMENT | Freq: Once | CUTANEOUS | Status: AC
  Administered 2017-12-14: 1 via TOPICAL
  Filled 2017-12-14: qty 0.9

## 2017-12-14 MED ORDER — BUPIVACAINE HCL (PF) 0.5 % IJ SOLN
10.0000 mL | Freq: Once | INTRAMUSCULAR | Status: AC
  Administered 2017-12-14: 10 mL
  Filled 2017-12-14: qty 30

## 2017-12-14 MED ORDER — TETANUS-DIPHTH-ACELL PERTUSSIS 5-2.5-18.5 LF-MCG/0.5 IM SUSP
0.5000 mL | Freq: Once | INTRAMUSCULAR | Status: AC
  Administered 2017-12-14: 0.5 mL via INTRAMUSCULAR
  Filled 2017-12-14: qty 0.5

## 2017-12-14 MED ORDER — ACETAMINOPHEN 325 MG PO TABS
650.0000 mg | ORAL_TABLET | Freq: Once | ORAL | Status: AC
  Administered 2017-12-14: 650 mg via ORAL
  Filled 2017-12-14: qty 2

## 2017-12-14 NOTE — ED Provider Notes (Signed)
[facility]-EMERGENCY DEPT Provider Note   CSN: [id number] Arrival date & time: 12/14/17  1242     History   Chief Complaint Chief Complaint  Patient presents with  . Laceration    HPI Eduardo French is a 57 y.o. male with PMH/o ARF, DJD, HTN who presents for evaluation of left index finger laceration that occurred 20 minutes prior to ED arrival. Patient reports he was opening a friend with some hedge clippers and states that that color slipped and cut the distal aspect of his index finger.  Patient states he is not currently on any blood thinners.  He states his tetanus is not up-to-date.  Patient denies any numbness/weakness.  The history is provided by the patient.    Past Medical History:  Diagnosis Date  . ARF (acute renal failure) (HCC) 09/20/2017  . Arthritis    "right knee, left hip, right elbow, between my shoulder blades, neck" (09/21/2017)  . Bipolar disorder (HCC)   . Chronic headache    "in the back of my neck; only in the winter time" (09/21/2017)  . Chronic neck pain   . Chronic shoulder pain    "between shoulder blades" (09/21/2017)  . Depression   . DJD (degenerative joint disease) of cervical spine    on disability  . High cholesterol   . History of stomach ulcers 1980s   "they healed"  . Hypertension   . Pneumonia    "maybe twice since childhood" (09/21/2017)  . S/P cardiac catheterization, 09/14/12 with mild non obstructive CAD and EF of 55% 09/15/2012  . Seizures (HCC)    "diabetic seizures when they first tried to get my RX right" (09/21/2017)  . Type II diabetes mellitus [facility])     Patient Active Problem List   Diagnosis Date Noted  . ARF (acute renal failure) (HCC) 09/21/2017  . Chest pain 09/21/2017  . Left sided numbness 09/21/2017  . Alcohol abuse with intoxication (HCC) 10/08/2014  . Cocaine abuse with intoxication and without complication (HCC) 10/08/2014  . Alcohol dependence with withdrawal, uncomplicated (HCC)  10/07/2014  . Cocaine abuse (HCC) 10/07/2014  . Substance induced mood disorder (HCC) 10/07/2014  . Cocaine use disorder, mild, abuse (HCC)   . Alcohol use disorder, moderate, dependence (HCC)   . Bipolar 1 disorder, depressed, severe (HCC) 06/10/2014  . Obesity (BMI 30.0-34.9) 04/11/2013  . Alcohol dependence (HCC) 11/06/2012  . Cocaine dependence (HCC) 11/06/2012  . Paranoid schizophrenia, chronic condition (HCC) 11/06/2012  . Alcohol abuse 11/05/2012  . S/P cardiac catheterization, 09/14/12 with mild non obstructive CAD and EF of 55% 09/15/2012  . HTN (hypertension), benign 09/12/2012  . DM type 2 (diabetes mellitus, type 2) (HCC) 09/12/2012  . BPH (benign prostatic hyperplasia) 09/12/2012  . Unstable angina, cardiac cath without obstruction 09/12/2012  . Smoker 09/12/2012  . 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 Depression 09/12/2012  . Bipolar disorder (HCC) 09/12/2012  . Dyslipidemia- (LDL 190 in 2011) 09/12/2012    Past Surgical History:  Procedure Laterality Date  . ANTERIOR CERVICAL DECOMP/DISCECTOMY FUSION  2006  . BACK SURGERY    . LEFT HEART CATHETERIZATION WITH CORONARY ANGIOGRAM N/A 09/14/2012   Procedure: LEFT HEART CATHETERIZATION WITH CORONARY ANGIOGRAM;  Surgeon: Thurmon Fair, MD;  Location: [facility] CATH LAB;  Service: Cardiovascular;  Laterality: N/A;        Home Medications    Prior to Admission medications   Medication Sig Start Date End Date Taking? Authorizing Provider  aspirin 81  MG tablet Take 1 tablet (81 mg total) by mouth daily. For heart health Patient taking differently: Take 81 mg by mouth every other day.  10/12/14  Yes Adonis BrookAgustin, Sheila, NP  benztropine (COGENTIN) 0.5 MG tablet Take 1 tablet (0.5 mg total) by mouth 2 (two) times daily. Patient taking differently: Take 0.5 mg by mouth daily.  10/12/14  Yes Rankin, Shuvon B, NP  Carboxymethylcellulose Sodium (EYE DROPS OP) Apply 2 drops to eye 2 (two) times daily. Right eye for cataract    Yes [provider]  escitalopram (LEXAPRO) 20 MG tablet Take 1 tablet (20 mg total) by mouth daily. Patient taking differently: Take 10 mg by mouth 2 (two) times daily.  10/12/14  Yes Rankin, Shuvon B, NP  haloperidol (HALDOL) 2 MG tablet Take 1 tablet (2 mg total) by mouth at bedtime. 10/12/14  Yes Rankin, Shuvon B, NP  LEVEMIR FLEXTOUCH 100 UNIT/ML Pen Inject 30 Units into the skin every 16 (sixteen) hours. 11/30/17  Yes [provider]  lisinopril (PRINIVIL,ZESTRIL) 20 MG tablet Take 20 mg by mouth daily.   Yes [provider]  pravastatin (PRAVACHOL) 20 MG tablet Take 20 mg by mouth at bedtime. 11/30/17  Yes [provider]  atorvastatin (LIPITOR) 40 MG tablet Take 1 tablet (40 mg total) by mouth daily. Patient not taking: Reported on 12/08/2014 10/12/14   Rankin, Shuvon B, NP  cephALEXin (KEFLEX) 500 MG capsule Take 1 capsule (500 mg total) by mouth 2 (two) times daily for 7 days. 12/14/17 12/21/17  Maxwell CaulLayden, Alixander Rallis A, PA-C  cyclobenzaprine (FLEXERIL) 10 MG tablet Take 1 tablet (10 mg total) by mouth 3 (three) times daily as needed for muscle spasms. Patient not taking: Reported on 12/14/2017 10/14/17   Street, Rutgers University-Busch CampusMercedes, PA-C  fluticasone [facility](FLONASE) 50 MCG/ACT nasal spray Place 1 spray into both nostrils daily as needed for allergies or rhinitis. Patient not taking: Reported on 09/20/2017 10/12/14   Adonis BrookAgustin, Sheila, NP  folic acid (FOLVITE) 1 MG tablet Take 1 tablet (1 mg total) by mouth daily. Patient not taking: Reported on 12/14/2017 09/22/17   Joseph ArtVann, Jessica U, DO  Menthol-Methyl Salicylate (MUSCLE RUB) 10-15 % CREA Apply 1 application topically 2 (two) times daily. Patient not taking: Reported on 12/14/2017 09/22/17   Joseph ArtVann, Jessica U, DO  metFORMIN (GLUCOPHAGE) 1000 MG tablet Take 1 tablet (1,000 mg total) by mouth 2 (two) times daily with a meal. 10/12/14   Rankin, Shuvon B, NP  oxyCODONE-acetaminophen (PERCOCET) 10-325 MG tablet Take 1 tablet by mouth every 8 (eight) hours  as needed for pain.    [provider]  thiamine 100 MG tablet Take 1 tablet (100 mg total) by mouth daily. 09/22/17   Joseph ArtVann, Jessica U, DO    Family History Family History  Problem Relation Age of Onset  . Heart disease Brother        has had heart transplant  . Diabetes Mother   . Cancer Mother   . Hypertension Mother   . Cancer Brother   . Diabetes Brother   . Cancer Brother     Social History Social History   Tobacco Use  . Smoking status: Current Every Day Smoker    Packs/day: 0.12    Years: 35.00    Pack years: 4.20    Types: Cigarettes  . Smokeless tobacco: Never Used  Substance Use Topics  . Alcohol use: Yes    Alcohol/week: 39.0 standard drinks    Types: 2 Cans of beer, 37 Shots of liquor per  week    Frequency: Never    Comment: 09/21/2017 "3 pints of vodka/wk"  . Drug use: Not Currently    Types: Cocaine    Comment: 09/21/2017 "nothing in the last few weeks"     Allergies   Citrus   Review of Systems Review of Systems  Skin: Positive for wound.  Neurological: Negative for weakness and numbness.  All other systems reviewed and are negative.    Physical Exam Updated Vital Signs BP (!) 150/91   Pulse 89   Temp 98.3 F (36.8 C) (Oral)   Resp 16   SpO2 97%   Physical Exam  Constitutional: He appears well-developed and well-nourished.  HENT:  Head: Normocephalic and atraumatic.  Eyes: Conjunctivae and EOM are normal. Right eye exhibits no discharge. Left eye exhibits no discharge. No scleral icterus.  Cardiovascular:  Pulses:      Radial pulses are 2+ on the right side, and 2+ on the left side.  Pulmonary/Chest: Effort normal.  Musculoskeletal:  Tenderness noted to the distal aspect of the left index finger.  Flexion/extension intact without any difficulty.  Neurological: He is alert.  Sensation intact along major nerve distributions of BUE  Skin: Skin is warm and dry. Capillary refill takes less than 2 seconds.  Good distal cap  refill. LUE is not dusky in appearance or cool to touch.  Patient with 3 linear lacerations noted to the volar aspect of the distal left index finger.  The most proximal one measures approximately 0.5 cm followed by two 1.5 cm lacerations that are all less than a centimeter apart.  Psychiatric: He has a normal mood and affect. His speech is normal and behavior is normal.  Nursing note and vitals reviewed.    ED Treatments / Results  Labs (all labs ordered are listed, but only abnormal results are displayed) Labs Reviewed - No data to display  EKG None  Radiology Dg Finger Index Left  Result Date: 12/14/2017 CLINICAL DATA:  Left index finger laceration. EXAM: LEFT INDEX FINGER 2+V COMPARISON:  None. FINDINGS: There is no evidence of fracture or dislocation. There is no evidence of arthropathy or other focal bone abnormality. Laceration of distal soft tissues is noted without radiopaque foreign body. IMPRESSION: No fracture or dislocation is noted. Laceration of distal soft tissues of the left index finger is noted without radiopaque foreign body. Electronically Signed   By: Lupita Raider, M.D.   On: 12/14/2017 13:58    Procedures .Marland KitchenLaceration Repair Date/Time: 12/14/2017 3:07 PM Performed by: Maxwell Caul, PA-C Authorized by: Maxwell Caul, PA-C   Consent:    Consent obtained:  Verbal   Consent given by:  Patient   Risks discussed:  Infection, need for additional repair, pain, poor cosmetic result and poor wound healing   Alternatives discussed:  No treatment and delayed treatment Universal protocol:    Procedure explained and questions answered to patient or proxy's satisfaction: yes     Relevant documents present and verified: yes     Test results available and properly labeled: yes     Imaging studies available: yes     Required blood products, implants, devices, and special equipment available: yes     Site/side marked: yes     Immediately prior to procedure, a time  out was called: yes     Patient identity confirmed:  Verbally with patient Anesthesia (see MAR for exact dosages):    Anesthesia method:  Local infiltration   Local anesthetic:  Bupivacaine 0.5%  w/o epi Laceration details:    Location:  Finger   Finger location:  L index finger   Length (cm):  1.5 Repair type:    Repair type:  Simple Pre-procedure details:    Preparation:  Patient was prepped and draped in usual sterile fashion Exploration:    Hemostasis achieved with:  Direct pressure   Wound extent: no foreign bodies/material noted   Treatment:    Area cleansed with:  Betadine and saline   Amount of cleaning:  Extensive   Irrigation solution:  Sterile saline   Irrigation method:  Syringe   Visualized foreign bodies/material removed: no   Skin repair:    Repair method:  Sutures   Suture size:  5-0   Suture material:  Nylon   Suture technique:  Simple interrupted   Number of sutures:  7 Approximation:    Approximation:  Close Post-procedure details:    Dressing:  Antibiotic ointment, non-adherent dressing and splint for protection   Patient tolerance of procedure:  Tolerated well, no immediate complications   (including critical care time)  Medications Ordered in ED Medications  bupivacaine (MARCAINE) 0.5 % injection 10 mL (has no administration in time range)  bacitracin ointment (has no administration in time range)  Tdap (BOOSTRIX) injection 0.5 mL (0.5 mLs Intramuscular Given 12/14/17 1344)  acetaminophen (TYLENOL) tablet 650 mg (650 mg Oral Given 12/14/17 1344)     Initial Impression / Assessment and Plan / ED Course  I have reviewed the triage vital signs and the nursing notes.  Pertinent labs & imaging results that were available during my care of the patient were reviewed by me and considered in my medical decision making (see chart for details).     57 year old male who presents for evaluation of left index finger laceration that occurred just prior to ED  arrival.  Reports he was using an [employer] rights managerelectric hedge trimmer and cut his finger.  Tetanus not up-to-date. Patient is afebril, non-toxic appearing, sitting comfortably on examination table. Vital signs reviewed and stable. Patient is neurovascularly intact.  On exam, he has 3 linear lacerations noted to the volar aspect of the distal left index finger.  X-ray ordered at triage.  X-ray reviewed.  No evidence of acute fracture dislocation.  We will plan to repair.  Lacerations repaired as documented above.  Patient tolerated procedure well without any difficulty.  Given that this was exposed to a dirty tool, will plan for antibiotic therapy.  He does have a history of chronic kidney disease.  Will adjust for BUN and creatinine.  Discussed with patient regarding wound care precautions. Patient had ample opportunity for questions and discussion. All patient's questions were answered with full understanding. Strict return precautions discussed. Patient expresses understanding and agreement to plan.   Final Clinical Impressions(s) / ED Diagnoses   Final diagnoses:  Laceration of left index finger without foreign body without damage to nail, initial encounter    ED Discharge Orders         Ordered    cephALEXin (KEFLEX) 500 MG capsule  2 times daily     12/14/17 1507           Rosana HoesLayden, Carleta Woodrow A, PA-C 12/14/17 1631    Tilden Fossaees, Elizabeth, MD 12/16/17 1343

## 2017-12-14 NOTE — ED Notes (Signed)
Dressing and finger splint applied to left index finger.

## 2017-12-14 NOTE — ED Triage Notes (Signed)
Pt was helping friend with hedge clippers and got is left index finger in two places. Pt has wrapped in triage.

## 2017-12-14 NOTE — Discharge Instructions (Signed)
Keep the wound clean and dry for the first 24 hours. After that you may gently clean the wound with soap and water. Make sure to pat dry the wound before covering it with any dressing. You can use topical antibiotic ointment and bandage. Ice and elevate for pain relief.   Take antibiotics as directed. Please take all of your antibiotics until finished.  You can take Tylenol or Ibuprofen as directed for pain. You can alternate Tylenol and Ibuprofen every 4 hours for additional pain relief.   Return to the Emergency Department, your primary care doctor, or the [facility] in 5-7 days for suture removal.   Monitor closely for any signs of infection. Return to the Emergency Department for any worsening redness/swelling of the area that begins to spread, drainage from the site, worsening pain, fever or any other worsening or concerning symptoms.   

## 2017-12-14 NOTE — ED Notes (Signed)
Dressing removed, 3 separate lacerations noted one large laceration noted on tip of index finger.

## 2017-12-14 NOTE — ED Notes (Signed)
Pt requesting food due to being a diabetic. Pt provided with ravioli and juice.

## 2017-12-23 ENCOUNTER — Emergency Department (HOSPITAL_COMMUNITY)
Admission: EM | Admit: 2017-12-23 | Discharge: 2017-12-23 | Disposition: A | Discharge status: Home / Self Care | Payer: Medicaid Other | Attending: Emergency Medicine | Admitting: Emergency Medicine

## 2017-12-23 ENCOUNTER — Other Ambulatory Visit: Payer: Self-pay

## 2017-12-23 ENCOUNTER — Encounter (HOSPITAL_COMMUNITY): Payer: Self-pay | Admitting: Emergency Medicine

## 2017-12-23 DIAGNOSIS — L089 Local infection of the skin and subcutaneous tissue, unspecified: Secondary | ICD-10-CM

## 2017-12-23 DIAGNOSIS — E119 Type 2 diabetes mellitus without complications: Secondary | ICD-10-CM | POA: Diagnosis not present

## 2017-12-23 DIAGNOSIS — I10 Essential (primary) hypertension: Secondary | ICD-10-CM | POA: Diagnosis not present

## 2017-12-23 DIAGNOSIS — Z7982 Long term (current) use of aspirin: Secondary | ICD-10-CM | POA: Diagnosis not present

## 2017-12-23 DIAGNOSIS — W298XXA Contact with other powered powered hand tools and household machinery, initial encounter: Secondary | ICD-10-CM | POA: Insufficient documentation

## 2017-12-23 DIAGNOSIS — Z794 Long term (current) use of insulin: Secondary | ICD-10-CM | POA: Diagnosis not present

## 2017-12-23 DIAGNOSIS — Z4802 Encounter for removal of sutures: Secondary | ICD-10-CM | POA: Diagnosis not present

## 2017-12-23 DIAGNOSIS — F17210 Nicotine dependence, cigarettes, uncomplicated: Secondary | ICD-10-CM | POA: Insufficient documentation

## 2017-12-23 DIAGNOSIS — Z79899 Other long term (current) drug therapy: Secondary | ICD-10-CM | POA: Diagnosis not present

## 2017-12-23 DIAGNOSIS — S61211D Laceration without foreign body of left index finger without damage to nail, subsequent encounter: Secondary | ICD-10-CM | POA: Insufficient documentation

## 2017-12-23 MED ORDER — CLINDAMYCIN HCL 300 MG PO CAPS: 300.0000 mg | ORAL_CAPSULE | Freq: Three times a day (TID) | ORAL | 0 refills | Status: AC

## 2017-12-23 MED ORDER — CLINDAMYCIN HCL 300 MG PO CAPS
300.0000 mg | ORAL_CAPSULE | Freq: Once | ORAL | Status: AC
  Administered 2017-12-23: 300 mg via ORAL
  Filled 2017-12-23: qty 1

## 2017-12-23 MED ORDER — BACITRACIN ZINC 500 UNIT/GM EX OINT
TOPICAL_OINTMENT | CUTANEOUS | Status: AC
  Filled 2017-12-23: qty 0.9

## 2017-12-23 NOTE — ED Provider Notes (Signed)
[facility]-EMERGENCY DEPT Provider Note   CSN: [id number] Arrival date & time: 12/23/17  [id number]     History   Chief Complaint Chief Complaint  Patient presents with  . Suture / Staple Removal    HPI Eduardo GasserLeslie L Quam is a 57 y.o. male presenting for suture removal.  Patient states he cut his finger on hedge clippers last week.  Sutures were placed at that time.  Since then, he has had continued soreness of the distal finger and swelling.  He reports it is difficult to bend his finger at the DIP.  He denies drainage from the area.  He states he was not able to pick up the antibiotic until 5 days ago, he is only been taking it once a day instead of twice a day.  He denies fevers, chills.  Pain is present with palpation and movement of the finger, no pain at rest.    HPI  Past Medical History:  Diagnosis Date  . ARF (acute renal failure) (HCC) 09/20/2017  . Arthritis    "right knee, left hip, right elbow, between my shoulder blades, neck" (09/21/2017)  . Bipolar disorder (HCC)   . Chronic headache    "in the back of my neck; only in the winter time" (09/21/2017)  . Chronic neck pain   . Chronic shoulder pain    "between shoulder blades" (09/21/2017)  . Depression   . DJD (degenerative joint disease) of cervical spine    on disability  . High cholesterol   . History of stomach ulcers 1980s   "they healed"  . Hypertension   . Pneumonia    "maybe twice since childhood" (09/21/2017)  . S/P cardiac catheterization, 09/14/12 with mild non obstructive CAD and EF of 55% 09/15/2012  . Seizures (HCC)    "diabetic seizures when they first tried to get my RX right" (09/21/2017)  . Type II diabetes mellitus [facility](HCC)     Patient Active Problem List   Diagnosis Date Noted  . ARF (acute renal failure) (HCC) 09/21/2017  . Chest pain 09/21/2017  . Left sided numbness 09/21/2017  . Alcohol abuse with intoxication (HCC) 10/08/2014  . Cocaine abuse with intoxication and  without complication (HCC) 10/08/2014  . Alcohol dependence with withdrawal, uncomplicated (HCC) 10/07/2014  . Cocaine abuse (HCC) 10/07/2014  . Substance induced mood disorder (HCC) 10/07/2014  . Cocaine use disorder, mild, abuse (HCC)   . Alcohol use disorder, moderate, dependence (HCC)   . Bipolar 1 disorder, depressed, severe (HCC) 06/10/2014  . Obesity (BMI 30.0-34.9) 04/11/2013  . Alcohol dependence (HCC) 11/06/2012  . Cocaine dependence (HCC) 11/06/2012  . Paranoid schizophrenia, chronic condition (HCC) 11/06/2012  . Alcohol abuse 11/05/2012  . S/P cardiac catheterization, 09/14/12 with mild non obstructive CAD and EF of 55% 09/15/2012  . HTN (hypertension), benign 09/12/2012  . DM type 2 (diabetes mellitus, type 2) (HCC) 09/12/2012  . BPH (benign prostatic hyperplasia) 09/12/2012  . Unstable angina, cardiac cath without obstruction 09/12/2012  . Smoker 09/12/2012  . 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 Depression 09/12/2012  . Bipolar disorder (HCC) 09/12/2012  . Dyslipidemia- (LDL 190 in 2011) 09/12/2012    Past Surgical History:  Procedure Laterality Date  . ANTERIOR CERVICAL DECOMP/DISCECTOMY FUSION  2006  . BACK SURGERY    . LEFT HEART CATHETERIZATION WITH CORONARY ANGIOGRAM N/A 09/14/2012   Procedure: LEFT HEART CATHETERIZATION WITH CORONARY ANGIOGRAM;  Surgeon: Thurmon FairMihai Croitoru, MD;  Location: [facility] CATH LAB;  Service: Cardiovascular;  Laterality: N/A;        Home Medications    Prior to Admission medications   Medication Sig Start Date End Date Taking? Authorizing Provider  aspirin 81 MG tablet Take 1 tablet (81 mg total) by mouth daily. For heart health Patient taking differently: Take 81 mg by mouth every other day.  10/12/14   Adonis Brook, NP  atorvastatin (LIPITOR) 40 MG tablet Take 1 tablet (40 mg total) by mouth daily. Patient not taking: Reported on 12/08/2014 10/12/14   Rankin, Shuvon B, NP  benztropine (COGENTIN) 0.5 MG tablet Take 1 tablet  (0.5 mg total) by mouth 2 (two) times daily. Patient taking differently: Take 0.5 mg by mouth daily.  10/12/14   Rankin, Shuvon B, NP  Carboxymethylcellulose Sodium (EYE DROPS OP) Apply 2 drops to eye 2 (two) times daily. Right eye for cataract    [provider]  clindamycin (CLEOCIN) 300 MG capsule Take 1 capsule (300 mg total) by mouth 3 (three) times daily for 7 days. 12/23/17 12/30/17  Danaiya Steadman, PA-C  cyclobenzaprine (FLEXERIL) 10 MG tablet Take 1 tablet (10 mg total) by mouth 3 (three) times daily as needed for muscle spasms. Patient not taking: Reported on 12/14/2017 10/14/17   Street, [place], PA-C  escitalopram (LEXAPRO) 20 MG tablet Take 1 tablet (20 mg total) by mouth daily. Patient taking differently: Take 10 mg by mouth 2 (two) times daily.  10/12/14   Rankin, Shuvon B, NP  fluticasone (FLONASE) 50 MCG/ACT nasal spray Place 1 spray into both nostrils daily as needed for allergies or rhinitis. Patient not taking: Reported on 09/20/2017 10/12/14   Adonis Brook, NP  folic acid (FOLVITE) 1 MG tablet Take 1 tablet (1 mg total) by mouth daily. Patient not taking: Reported on 12/14/2017 09/22/17   Joseph Art, DO  haloperidol (HALDOL) 2 MG tablet Take 1 tablet (2 mg total) by mouth at bedtime. 10/12/14   Rankin, Shuvon B, NP  LEVEMIR FLEXTOUCH 100 UNIT/ML Pen Inject 30 Units into the skin every 16 (sixteen) hours. 11/30/17   [provider]  lisinopril (PRINIVIL,ZESTRIL) 40 MG tablet Take 40 mg by mouth daily. 12/15/17   [provider]  Menthol-Methyl Salicylate (MUSCLE RUB) 10-15 % CREA Apply 1 application topically 2 (two) times daily. Patient not taking: Reported on 12/14/2017 09/22/17   Joseph Art, DO  metFORMIN (GLUCOPHAGE) 1000 MG tablet Take 1 tablet (1,000 mg total) by mouth 2 (two) times daily with a meal. 10/12/14   Rankin, Shuvon B, NP  oxyCODONE-acetaminophen (PERCOCET) 10-325 MG tablet Take 1 tablet by mouth every 8 (eight) hours as needed for pain.     [provider]  pravastatin (PRAVACHOL) 20 MG tablet Take 20 mg by mouth at bedtime. 11/30/17   [provider]  thiamine 100 MG tablet Take 1 tablet (100 mg total) by mouth daily. 09/22/17   Joseph Art, DO    Family History Family History  Problem Relation Age of Onset  . Heart disease Brother        has had heart transplant  . Diabetes Mother   . Cancer Mother   . Hypertension Mother   . Cancer Brother   . Diabetes Brother   . Cancer Brother     Social History Social History   Tobacco Use  . Smoking status: Current Every Day Smoker    Packs/day: 0.12    Years: 35.00    Pack years: 4.20    Types: Cigarettes  . Smokeless tobacco:  Never Used  Substance Use Topics  . Alcohol use: Yes    Alcohol/week: 39.0 standard drinks    Types: 2 Cans of beer, 37 Shots of liquor per week    Frequency: Never    Comment: 09/21/2017 "3 pints of vodka/wk"  . Drug use: Not Currently    Types: Cocaine    Comment: 09/21/2017 "nothing in the last few weeks"     Allergies   Citrus   Review of Systems Review of Systems  Constitutional: Negative for fever.  Skin: Positive for wound.  Neurological: Negative for numbness.  Hematological: Does not bruise/bleed easily.     Physical Exam Updated Vital Signs BP 140/82 (BP Location: Right Arm)   Pulse 62   Temp 97.6 F (36.4 C) (Oral)   Resp 17   SpO2 100%   Physical Exam  Constitutional: He is oriented to person, place, and time. He appears well-developed and well-nourished. No distress.  HENT:  Head: Normocephalic and atraumatic.  Eyes: EOM are normal.  Neck: Normal range of motion.  Pulmonary/Chest: Effort normal.  Abdominal: He exhibits no distension.  Musculoskeletal: Normal range of motion.  Neurological: He is alert and oriented to person, place, and time.  Skin: Skin is warm. Capillary refill takes less than 2 seconds. No rash noted.  Healing lacerations at the distal aspect of the left index  finger.  Continued swelling of the distal finger.  Strength against resistance of the DIP intact.  Tenderness palpation of the distal finger.  Psychiatric: He has a normal mood and affect.  Nursing note and vitals reviewed.    ED Treatments / Results  Labs (all labs ordered are listed, but only abnormal results are displayed) Labs Reviewed - No data to display  EKG None  Radiology No results found.  Procedures .Suture Removal Date/Time: 12/23/2017 9:42 AM Performed by: Alveria Apleyaccavale, Tarahji Ramthun, PA-C Authorized by: Alveria Apleyaccavale, Asianna Brundage, PA-C   Consent:    Consent obtained:  Verbal   Consent given by:  Patient   Risks discussed:  Bleeding, pain and wound separation Location:    Location:  Upper extremity   Upper extremity location:  Hand   Hand location:  L index finger Procedure details:    Wound appearance:  Tender and draining   Drainage characteristics:  Upon removal of sutures, small amout of pus draiang from suture locations   Number of sutures removed:  7 Post-procedure details:    Post-removal:  Dressing applied   Patient tolerance of procedure:  Tolerated well, no immediate complications   (including critical care time)  Medications Ordered in ED Medications  clindamycin (CLEOCIN) capsule 300 mg (has no administration in time range)     Initial Impression / Assessment and Plan / ED Course  I have reviewed the triage vital signs and the nursing notes.  Pertinent labs & imaging results that were available during my care of the patient were reviewed by me and considered in my medical decision making (see chart for details).     Pt presenting for suture removal.  Physical exam shows continued tenderness and swelling of the distal finger.  Upon suture removal, minimal amount of pus drained from the finger.  Patient has not been taking antibiotics as prescribed, and started late.  I am concerned about continued finger infection. As there is purulence, will switch abx to  clindamycin. Pt to f/u in 2 days for wound recheck with his PCP, if unable, he is to f/u in the ED. Discussed importance of taking antibiotics  as prescribed.  Discussed importance of daily cleansing and dressing of the wound.  He is without fevers, chills, or increasing redness/swelling down his finger, doubt systemic infection.  At this time, patient appears safe for discharge.  Return precautions given.  Patient states he understands agrees plan.  Final Clinical Impressions(s) / ED Diagnoses   Final diagnoses:  Visit for suture removal  Finger infection    ED Discharge Orders         Ordered    clindamycin (CLEOCIN) 300 MG capsule  3 times daily     12/23/17 0921           Alveria Apley, PA-C 12/23/17 0944    Raeford Razor, MD 12/24/17 1144

## 2017-12-23 NOTE — Discharge Instructions (Signed)
Take antibiotics as prescribed.  Take the entire course, even if symptoms improve.  You will take the antibiotics 3 times a day. Wash the cut daily with soap and water, and reapply new dressing each day. Follow-up with your primary care doctor in 2 days for recheck of your finger.  If you are unable to follow-up with your doctor, return to the emergency room for recheck. Return to the emergency room if you develop high fevers, significant worsening symptoms, or any new or concerning symptoms.

## 2017-12-23 NOTE — ED Notes (Signed)
Pt give sandwich and OJ per PA

## 2017-12-23 NOTE — ED Triage Notes (Signed)
Pt had stitches placed in left index finger last week, here to get them removed. Reports little soreness but denies s/s of infection.

## 2017-12-23 NOTE — ED Notes (Signed)
EDPA STATES STOPS TAKING OTHER ANTIBIOTICS AND START TAKING NEW ANTIBIOTICS NOW

## 2018-03-19 ENCOUNTER — Encounter: Payer: Self-pay | Admitting: Gastroenterology

## 2018-04-16 ENCOUNTER — Ambulatory Visit (INDEPENDENT_AMBULATORY_CARE_PROVIDER_SITE_OTHER): Payer: Medicaid Other | Admitting: Orthopaedic Surgery

## 2018-04-16 ENCOUNTER — Ambulatory Visit (INDEPENDENT_AMBULATORY_CARE_PROVIDER_SITE_OTHER): Payer: Self-pay

## 2018-04-16 DIAGNOSIS — M542 Cervicalgia: Secondary | ICD-10-CM | POA: Diagnosis not present

## 2018-04-16 DIAGNOSIS — M1612 Unilateral primary osteoarthritis, left hip: Secondary | ICD-10-CM

## 2018-04-16 DIAGNOSIS — M25552 Pain in left hip: Secondary | ICD-10-CM

## 2018-04-16 DIAGNOSIS — M4722 Other spondylosis with radiculopathy, cervical region: Secondary | ICD-10-CM | POA: Diagnosis not present

## 2018-04-16 NOTE — Progress Notes (Signed)
Office Visit Note   Patient: Eduardo French           Date of Birth: 12/25/1960           MRN: [id number] Visit Date: 04/16/2018              Requested by: [facility] [address] [place], [place] [address] PCP: [facility]   Assessment & Plan: Visit Diagnoses:  1. Pain in left hip   2. Neck pain   3. Other spondylosis with radiculopathy, cervical region   4. Arthritis of left hip   Left hip mechanical symptoms  Plan: Advised patient that with his cervical spine issues from adjacent segment disease recommend conservative treatment with trial of cervical ESI's.  Regards to his left hip pain mechanical symptoms we will plan to get MRI to rule out labral tear and evaluate the extent of his DJD.  He will discuss the injections with the pain clinic that he is seeing next week.  For chronic right shoulder issues that may also possibly an attempt an injection there versus getting an MRI of the shoulder and compared to previous study done a few years ago.  He will follow-up with Dr. Ophelia Charter in a few weeks for recheck at that point we can see what the pain clinic is recommending and Dr. Ophelia Charter can review his hip MRI  Follow-Up Instructions: Return in about 3 weeks (around 05/07/2018) for with Dr Ophelia Charter to review mri hip.   Orders:  Orders Placed This Encounter  Procedures  . XR HIP UNILAT W OR W/O PELVIS 2-3 VIEWS LEFT  . XR Cervical Spine 2 or 3 views  . MR Hip Left w/o contrast   No orders of the defined types were placed in this encounter.     Procedures: No procedures performed   Clinical Data: No additional findings.   Subjective: Chief Complaint  Patient presents with  . Left Hip - Pain  . Neck - Pain    HPI 57 year old white male comes in today with complaints of neck pain, right shoulder pain and left hip pain.  Patient is status post C4-C7 ACDF with Dr. Newell Coral 2006.  States that he did not return back to Dr.  Newell Coral since they had question of a falling out per patient.  States that he had chronic neck pain that radiates into the right scapula.  Pain is described as being constant but increased with activity.  Nothing radiating down either arm.  Reports chronic right shoulder pain that is aggravated with overhead activity and reaching on his back.  He did have a right shoulder MRI April 17, 2006 and that report showed: IMPRESSION:   1. AC joint degenerative changes and lateral downsloping acromion along with a type II/III acromion may contribute to bony impingement.   2. Mild to moderate rotator cuff tendinopathy/tendinosis, but no discrete partial or full thickness rotator cuff tear.  3. Degenerative superior labral changes without discrete labral tear.   4. Intact biceps tendon.    He has not had any treatment for the shoulder.  Patient reports having pain to the left hip where he localizes to the groin area.  States that he is also had some left testicular pain.  Has not really addressed this with his primary care.  Review of Systems No current cardiopulmonary GI GU issues  Objective: Vital Signs: There were no vitals taken for this visit.  Physical Exam Constitutional:  Appearance: Normal appearance.  HENT:     Head: Normocephalic and atraumatic.     Nose: Nose normal.  Eyes:     Extraocular Movements: Extraocular movements intact.     Pupils: Pupils are equal, round, and reactive to light.  Neck:     Comments: Still limitation range of motion due to stiffness and also previous fusion.  Positive brachial plexus tenderness.  Also tender over the right scapula. Pulmonary:     Effort: Pulmonary effort is normal. No respiratory distress.  Musculoskeletal:     Comments: Right shoulder has good range of motion.  Positive impingement test.  Negative drop arm.  Some discomfort with supraspinatus resistance.  Left hip is good range of motion but pain with about 15 to 20 degrees internal  rotation.  Negative straight leg raise.  No focal motor deficits.  Skin:    General: Skin is warm and dry.  Neurological:     Mental Status: He is alert.  Psychiatric:        Mood and Affect: Mood normal.     Ortho Exam  Specialty Comments:  No specialty comments available.  Imaging: No results found.   PMFS History: Patient Active Problem List   Diagnosis Date Noted  . ARF (acute renal failure) (HCC) 09/21/2017  . Chest pain 09/21/2017  . Left sided numbness 09/21/2017  . Alcohol abuse with intoxication (HCC) 10/08/2014  . Cocaine abuse with intoxication and without complication (HCC) 10/08/2014  . Alcohol dependence with withdrawal, uncomplicated (HCC) 10/07/2014  . Cocaine abuse (HCC) 10/07/2014  . Substance induced mood disorder (HCC) 10/07/2014  . Cocaine use disorder, mild, abuse (HCC)   . Alcohol use disorder, moderate, dependence (HCC)   . Bipolar 1 disorder, depressed, severe (HCC) 06/10/2014  . Obesity (BMI 30.0-34.9) 04/11/2013  . Alcohol dependence (HCC) 11/06/2012  . Cocaine dependence (HCC) 11/06/2012  . Paranoid schizophrenia, chronic condition (HCC) 11/06/2012  . Alcohol abuse 11/05/2012  . S/P cardiac catheterization, 09/14/12 with mild non obstructive CAD and EF of 55% 09/15/2012  . HTN (hypertension), benign 09/12/2012  . DM type 2 (diabetes mellitus, type 2) (HCC) 09/12/2012  . BPH (benign prostatic hyperplasia) 09/12/2012  . Unstable angina, cardiac cath without obstruction 09/12/2012  . Smoker 09/12/2012  . 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 Depression 09/12/2012  . Bipolar disorder (HCC) 09/12/2012  . Dyslipidemia- (LDL 190 in 2011) 09/12/2012   Past Medical History:  Diagnosis Date  . ARF (acute renal failure) (HCC) 09/20/2017  . Arthritis    "right knee, left hip, right elbow, between my shoulder blades, neck" (09/21/2017)  . Bipolar disorder (HCC)   . Chronic headache    "in the back of my neck; only in the winter  time" (09/21/2017)  . Chronic neck pain   . Chronic shoulder pain    "between shoulder blades" (09/21/2017)  . Depression   . DJD (degenerative joint disease) of cervical spine    on disability  . High cholesterol   . History of stomach ulcers 1980s   "they healed"  . Hypertension   . Pneumonia    "maybe twice since childhood" (09/21/2017)  . S/P cardiac catheterization, 09/14/12 with mild non obstructive CAD and EF of 55% 09/15/2012  . Seizures (HCC)    "diabetic seizures when they first tried to get my RX right" (09/21/2017)  . Type II diabetes mellitus (HCC)     Family History  Problem Relation Age of Onset  . Heart disease Brother  has had heart transplant  . Diabetes Mother   . Cancer Mother   . Hypertension Mother   . Cancer Brother   . Diabetes Brother   . Cancer Brother     Past Surgical History:  Procedure Laterality Date  . ANTERIOR CERVICAL DECOMP/DISCECTOMY FUSION  2006  . BACK SURGERY    . LEFT HEART CATHETERIZATION WITH CORONARY ANGIOGRAM N/A 09/14/2012   Procedure: LEFT HEART CATHETERIZATION WITH CORONARY ANGIOGRAM;  Surgeon: Thurmon Fair, MD;  Location: [facility] CATH LAB;  Service: Cardiovascular;  Laterality: N/A;   Social History   Occupational History  . Not on file  Tobacco Use  . Smoking status: Current Every Day Smoker    Packs/day: 0.12    Years: 35.00    Pack years: 4.20    Types: Cigarettes  . Smokeless tobacco: Never Used  Substance and Sexual Activity  . Alcohol use: Yes    Alcohol/week: 39.0 standard drinks    Types: 2 Cans of beer, 37 Shots of liquor per week    Frequency: Never    Comment: 09/21/2017 "3 pints of vodka/wk"  . Drug use: Not Currently    Types: Cocaine    Comment: 09/21/2017 "nothing in the last few weeks"  . Sexual activity: Not Currently

## 2018-04-20 ENCOUNTER — Ambulatory Visit: Payer: Self-pay | Admitting: Gastroenterology

## 2018-04-20 NOTE — Progress Notes (Deleted)
Referring Provider: [facility] Primary Care Physician:  [facility]   Reason for Consultation: Diarrhea   IMPRESSION:  ***  PLAN: ***   HPI: Eduardo French is a 57 y.o. male    Quality: Amount: Duration: Timing: Progression: Chronicity: Context: Similar prior episodes: Relieved by: Worsened by: Effective treatments: Ineffective treatments: Associated symptoms: Risks factors:   Past Medical History:  Diagnosis Date  . ARF (acute renal failure) (HCC) 09/20/2017  . Arthritis    "right knee, left hip, right elbow, between my shoulder blades, neck" (09/21/2017)  . Bipolar disorder (HCC)   . Chronic headache    "in the back of my neck; only in the winter time" (09/21/2017)  . Chronic neck pain   . Chronic shoulder pain    "between shoulder blades" (09/21/2017)  . Depression   . DJD (degenerative joint disease) of cervical spine    on disability  . High cholesterol   . History of stomach ulcers 1980s   "they healed"  . Hypertension   . Pneumonia    "maybe twice since childhood" (09/21/2017)  . S/P cardiac catheterization, 09/14/12 with mild non obstructive CAD and EF of 55% 09/15/2012  . Seizures (HCC)    "diabetic seizures when they first tried to get my RX right" (09/21/2017)  . Type II diabetes mellitus (HCC)     Past Surgical History:  Procedure Laterality Date  . ANTERIOR CERVICAL DECOMP/DISCECTOMY FUSION  2006  . BACK SURGERY    . LEFT HEART CATHETERIZATION WITH CORONARY ANGIOGRAM N/A 09/14/2012   Procedure: LEFT HEART CATHETERIZATION WITH CORONARY ANGIOGRAM;  Surgeon: Thurmon FairMihai Croitoru, MD;  Location: [facility] CATH [facility];  Service: Cardiovascular;  Laterality: N/A;    Current Outpatient Medications  Medication Sig Dispense Refill  . aspirin 81 MG tablet Take 1 tablet (81 mg total) by mouth daily. For heart health (Patient taking differently: Take 81 mg by mouth every other day. ) 30 tablet   . atorvastatin (LIPITOR) 40  MG tablet Take 1 tablet (40 mg total) by mouth daily. 30 tablet 0  . benztropine (COGENTIN) 0.5 MG tablet Take 1 tablet (0.5 mg total) by mouth 2 (two) times daily. (Patient taking differently: Take 0.5 mg by mouth daily. ) 60 tablet 0  . Carboxymethylcellulose Sodium (EYE DROPS OP) Apply 2 drops to eye 2 (two) times daily. Right eye for cataract    . cyclobenzaprine (FLEXERIL) 10 MG tablet Take 1 tablet (10 mg total) by mouth 3 (three) times daily as needed for muscle spasms. (Patient not taking: Reported on 12/14/2017) 15 tablet 0  . escitalopram (LEXAPRO) 20 MG tablet Take 1 tablet (20 mg total) by mouth daily. (Patient taking differently: Take 10 mg by mouth 2 (two) times daily. ) 30 tablet 0  . fluticasone (FLONASE) 50 MCG/ACT nasal spray Place 1 spray into both nostrils daily as needed for allergies or rhinitis.  2  . folic acid (FOLVITE) 1 MG tablet Take 1 tablet (1 mg total) by mouth daily. 30 tablet 0  . haloperidol (HALDOL) 2 MG tablet Take 1 tablet (2 mg total) by mouth at bedtime. 30 tablet 0  . LEVEMIR FLEXTOUCH 100 UNIT/ML Pen Inject 30 Units into the skin every 16 (sixteen) hours.  3  . lisinopril (PRINIVIL,ZESTRIL) 40 MG tablet Take 40 mg by mouth daily.  3  . Menthol-Methyl Salicylate (MUSCLE RUB) 10-15 % CREA Apply 1 application topically 2 (two) times daily. (Patient not taking: Reported on 12/14/2017) 85 g  0  . metFORMIN (GLUCOPHAGE) 1000 MG tablet Take 1 tablet (1,000 mg total) by mouth 2 (two) times daily with a meal. 60 tablet 0  . oxyCODONE-acetaminophen (PERCOCET) 10-325 MG tablet Take 1 tablet by mouth every 8 (eight) hours as needed for pain.    . pravastatin (PRAVACHOL) 20 MG tablet Take 20 mg by mouth at bedtime.  3  . thiamine 100 MG tablet Take 1 tablet (100 mg total) by mouth daily. 30 tablet 0   No current facility-administered medications for this visit.     Allergies as of 04/20/2018 - Review Complete 04/16/2018  Allergen Reaction Noted  . Citrus Hives and Rash  02/19/2014    Family History  Problem Relation Age of Onset  . Heart disease Brother        has had heart transplant  . Diabetes Mother   . Cancer Mother   . Hypertension Mother   . Cancer Brother   . Diabetes Brother   . Cancer Brother     Social History   Socioeconomic History  . Marital status: Legally Separated    Spouse name: Not on file  . Number of children: Not on file  . Years of education: Not on file  . Highest education level: Not on file  Occupational History  . Not on file  Social Needs  . Financial resource strain: Not on file  . Food insecurity:    Worry: Not on file    Inability: Not on file  . Transportation needs:    Medical: Not on file    Non-medical: Not on file  Tobacco Use  . Smoking status: Current Every Day Smoker    Packs/day: 0.12    Years: 35.00    Pack years: 4.20    Types: Cigarettes  . Smokeless tobacco: Never Used  Substance and Sexual Activity  . Alcohol use: Yes    Alcohol/week: 39.0 standard drinks    Types: 2 Cans of beer, 37 Shots of liquor per week    Frequency: Never    Comment: 09/21/2017 "3 pints of vodka/wk"  . Drug use: Not Currently    Types: Cocaine    Comment: 09/21/2017 "nothing in the last few weeks"  . Sexual activity: Not Currently  Lifestyle  . Physical activity:    Days per week: Not on file    Minutes per session: Not on file  . Stress: Not on file  Relationships  . Social connections:    Talks on phone: Not on file    Gets together: Not on file    Attends religious service: Not on file    Active member of club or organization: Not on file    Attends meetings of clubs or organizations: Not on file    Relationship status: Not on file  . Intimate partner violence:    Fear of current or ex partner: Not on file    Emotionally abused: Not on file    Physically abused: Not on file    Forced sexual activity: Not on file  Other Topics Concern  . Not on file  Social History Narrative  . Not on file     Review of Systems: 12 system ROS is negative except as noted above.  There were no vitals filed for this visit.  Physical Exam: Vital signs were reviewed. General:   Alert, well-nourished, pleasant and cooperative in NAD Head:  Normocephalic and atraumatic. Eyes:  Sclera clear, no icterus.   Conjunctiva pink. Mouth:  No deformity  or lesions.   Neck:  Supple; no thyromegaly. Lungs:  Clear throughout to auscultation.   No wheezes.  Heart:  Regular rate and rhythm; no murmurs Abdomen:  Soft, nontender, normal bowel sounds. No rebound or guarding. No hepatosplenomegaly Rectal:  Deferred  Msk:  Symmetrical without gross deformities. Extremities:  No gross deformities or edema. Neurologic:  Alert and  oriented x4;  grossly nonfocal Skin:  No rash or bruise. Psych:  Alert and cooperative. Normal mood and affect.   Eevie Lapp L. Orvan Falconer, MD, MPH [facility] 04/20/2018, 8:42 AM

## 2018-04-22 ENCOUNTER — Encounter (INDEPENDENT_AMBULATORY_CARE_PROVIDER_SITE_OTHER): Payer: Self-pay | Admitting: Orthopaedic Surgery

## 2018-04-26 ENCOUNTER — Other Ambulatory Visit (INDEPENDENT_AMBULATORY_CARE_PROVIDER_SITE_OTHER): Payer: Self-pay | Admitting: Surgery

## 2018-04-26 DIAGNOSIS — Z77018 Contact with and (suspected) exposure to other hazardous metals: Secondary | ICD-10-CM

## 2018-05-11 ENCOUNTER — Other Ambulatory Visit: Payer: Self-pay

## 2018-06-19 ENCOUNTER — Encounter (HOSPITAL_COMMUNITY): Payer: Self-pay | Admitting: *Deleted

## 2018-06-19 ENCOUNTER — Observation Stay (HOSPITAL_COMMUNITY)
Admission: EM | Admit: 2018-06-19 | Discharge: 2018-06-20 | Disposition: A | Discharge status: Home / Self Care | Payer: Medicaid Other | Attending: Internal Medicine | Admitting: Internal Medicine

## 2018-06-19 ENCOUNTER — Other Ambulatory Visit: Payer: Self-pay

## 2018-06-19 DIAGNOSIS — IMO0001 Reserved for inherently not codable concepts without codable children: Secondary | ICD-10-CM | POA: Diagnosis present

## 2018-06-19 DIAGNOSIS — I2510 Atherosclerotic heart disease of native coronary artery without angina pectoris: Secondary | ICD-10-CM | POA: Insufficient documentation

## 2018-06-19 DIAGNOSIS — F17210 Nicotine dependence, cigarettes, uncomplicated: Secondary | ICD-10-CM | POA: Diagnosis not present

## 2018-06-19 DIAGNOSIS — E785 Hyperlipidemia, unspecified: Secondary | ICD-10-CM | POA: Insufficient documentation

## 2018-06-19 DIAGNOSIS — F17200 Nicotine dependence, unspecified, uncomplicated: Secondary | ICD-10-CM

## 2018-06-19 DIAGNOSIS — E860 Dehydration: Secondary | ICD-10-CM

## 2018-06-19 DIAGNOSIS — R739 Hyperglycemia, unspecified: Secondary | ICD-10-CM

## 2018-06-19 DIAGNOSIS — Z8249 Family history of ischemic heart disease and other diseases of the circulatory system: Secondary | ICD-10-CM | POA: Insufficient documentation

## 2018-06-19 DIAGNOSIS — F1910 Other psychoactive substance abuse, uncomplicated: Secondary | ICD-10-CM

## 2018-06-19 DIAGNOSIS — E1165 Type 2 diabetes mellitus with hyperglycemia: Secondary | ICD-10-CM | POA: Insufficient documentation

## 2018-06-19 DIAGNOSIS — F314 Bipolar disorder, current episode depressed, severe, without psychotic features: Secondary | ICD-10-CM | POA: Diagnosis not present

## 2018-06-19 DIAGNOSIS — R9431 Abnormal electrocardiogram [ECG] [EKG]: Secondary | ICD-10-CM | POA: Diagnosis not present

## 2018-06-19 DIAGNOSIS — F310 Bipolar disorder, current episode hypomanic: Secondary | ICD-10-CM

## 2018-06-19 DIAGNOSIS — Z794 Long term (current) use of insulin: Secondary | ICD-10-CM | POA: Insufficient documentation

## 2018-06-19 DIAGNOSIS — N179 Acute kidney failure, unspecified: Principal | ICD-10-CM | POA: Diagnosis present

## 2018-06-19 DIAGNOSIS — N178 Other acute kidney failure: Secondary | ICD-10-CM | POA: Diagnosis not present

## 2018-06-19 DIAGNOSIS — I10 Essential (primary) hypertension: Secondary | ICD-10-CM | POA: Insufficient documentation

## 2018-06-19 DIAGNOSIS — Z7982 Long term (current) use of aspirin: Secondary | ICD-10-CM | POA: Diagnosis not present

## 2018-06-19 DIAGNOSIS — Z79891 Long term (current) use of opiate analgesic: Secondary | ICD-10-CM | POA: Insufficient documentation

## 2018-06-19 DIAGNOSIS — Z79899 Other long term (current) drug therapy: Secondary | ICD-10-CM | POA: Diagnosis not present

## 2018-06-19 DIAGNOSIS — F319 Bipolar disorder, unspecified: Secondary | ICD-10-CM | POA: Diagnosis present

## 2018-06-19 DIAGNOSIS — Z833 Family history of diabetes mellitus: Secondary | ICD-10-CM | POA: Insufficient documentation

## 2018-06-19 HISTORY — DX: Other psychoactive substance abuse, uncomplicated: F19.10

## 2018-06-19 HISTORY — DX: Reserved for inherently not codable concepts without codable children: IMO0001

## 2018-06-19 HISTORY — DX: Acute kidney failure, unspecified: N17.9

## 2018-06-19 LAB — CBC WITH DIFFERENTIAL/PLATELET
Abs Immature Granulocytes: 0.07 10*3/uL (ref 0.00–0.07)
BASOS ABS: 0 10*3/uL (ref 0.0–0.1)
BASOS PCT: 0 %
Eosinophils Absolute: 0.1 10*3/uL (ref 0.0–0.5)
Eosinophils Relative: 1 %
HCT: 43.3 % (ref 39.0–52.0)
Hemoglobin: 15.3 g/dL (ref 13.0–17.0)
Immature Granulocytes: 1 %
Lymphocytes Relative: 31 %
Lymphs Abs: 2.7 10*3/uL (ref 0.7–4.0)
MCH: 29.9 pg (ref 26.0–34.0)
MCHC: 35.3 g/dL (ref 30.0–36.0)
MCV: 84.6 fL (ref 80.0–100.0)
Monocytes Absolute: 0.7 10*3/uL (ref 0.1–1.0)
Monocytes Relative: 8 %
NRBC: 0 % (ref 0.0–0.2)
Neutro Abs: 5.1 10*3/uL (ref 1.7–7.7)
Neutrophils Relative %: 59 %
Platelets: 383 10*3/uL (ref 150–400)
RBC: 5.12 MIL/uL (ref 4.22–5.81)
RDW: 11.5 % (ref 11.5–15.5)
WBC: 8.7 10*3/uL (ref 4.0–10.5)

## 2018-06-19 LAB — GLUCOSE, CAPILLARY
GLUCOSE-CAPILLARY: 485 mg/dL — AB (ref 70–99)
GLUCOSE-CAPILLARY: 438 mg/dL — AB (ref 70–99)
Glucose-Capillary: 317 mg/dL — ABNORMAL HIGH (ref 70–99)

## 2018-06-19 LAB — BASIC METABOLIC PANEL
Anion gap: 14 (ref 5–15)
BUN: 25 mg/dL — ABNORMAL HIGH (ref 6–20)
CO2: 21 mmol/L — ABNORMAL LOW (ref 22–32)
Calcium: 9.6 mg/dL (ref 8.9–10.3)
Chloride: 89 mmol/L — ABNORMAL LOW (ref 98–111)
Creatinine, Ser: 2.51 mg/dL — ABNORMAL HIGH (ref 0.61–1.24)
GFR calc non Af Amer: 27 mL/min — ABNORMAL LOW (ref >60)
GFR, EST AFRICAN AMERICAN: 32 mL/min — AB (ref >60)
Glucose, Bld: 720 mg/dL (ref 70–99)
Potassium: 4.3 mmol/L (ref 3.5–5.1)
Sodium: 124 mmol/L — ABNORMAL LOW (ref 135–145)

## 2018-06-19 LAB — URINALYSIS, ROUTINE W REFLEX MICROSCOPIC
Bacteria, UA: NONE SEEN
Bilirubin Urine: NEGATIVE
Glucose, UA: >=500 mg/dL — AB
Hgb urine dipstick: NEGATIVE
Ketones, ur: NEGATIVE mg/dL
Leukocytes,Ua: NEGATIVE
Nitrite: NEGATIVE
Protein, ur: NEGATIVE mg/dL
Specific Gravity, Urine: 1.02 (ref 1.005–1.030)
pH: 6 (ref 5.0–8.0)

## 2018-06-19 LAB — RAPID URINE DRUG SCREEN, HOSP PERFORMED
Amphetamines: NOT DETECTED
Barbiturates: NOT DETECTED
Benzodiazepines: NOT DETECTED
Cocaine: NOT DETECTED
Opiates: NOT DETECTED
Tetrahydrocannabinol: NOT DETECTED

## 2018-06-19 MED ORDER — SODIUM CHLORIDE 0.9 % IV BOLUS
1000.0000 mL | Freq: Once | INTRAVENOUS | Status: AC
  Administered 2018-06-19: 1000 mL via INTRAVENOUS

## 2018-06-19 MED ORDER — DEXTROSE-NACL 5-0.45 % IV SOLN: INTRAVENOUS | Status: DC

## 2018-06-19 MED ORDER — INSULIN ASPART 100 UNIT/ML ~~LOC~~ SOLN
4.0000 [IU] | Freq: Three times a day (TID) | SUBCUTANEOUS | Status: DC
  Administered 2018-06-20 (×2): 4 [IU] via SUBCUTANEOUS

## 2018-06-19 MED ORDER — OXYCODONE-ACETAMINOPHEN 10-325 MG PO TABS: 1.0000 | ORAL_TABLET | Freq: Three times a day (TID) | ORAL | Status: DC | PRN

## 2018-06-19 MED ORDER — ESCITALOPRAM OXALATE 20 MG PO TABS
20.0000 mg | ORAL_TABLET | Freq: Every day | ORAL | Status: DC
  Administered 2018-06-20: 20 mg via ORAL
  Filled 2018-06-19: qty 1

## 2018-06-19 MED ORDER — INSULIN ASPART 100 UNIT/ML ~~LOC~~ SOLN
0.0000 [IU] | Freq: Three times a day (TID) | SUBCUTANEOUS | Status: DC
  Administered 2018-06-20: 8 [IU] via SUBCUTANEOUS
  Administered 2018-06-20: 11 [IU] via SUBCUTANEOUS

## 2018-06-19 MED ORDER — INSULIN DETEMIR 100 UNIT/ML FLEXPEN: 30.0000 [IU] | PEN_INJECTOR | Freq: Every day | SUBCUTANEOUS | Status: DC

## 2018-06-19 MED ORDER — ACETAMINOPHEN 325 MG PO TABS: 650.0000 mg | ORAL_TABLET | Freq: Four times a day (QID) | ORAL | Status: DC | PRN

## 2018-06-19 MED ORDER — OXYCODONE HCL 5 MG PO TABS: 5.0000 mg | ORAL_TABLET | Freq: Three times a day (TID) | ORAL | Status: DC | PRN

## 2018-06-19 MED ORDER — INSULIN ASPART 100 UNIT/ML ~~LOC~~ SOLN
10.0000 [IU] | Freq: Once | SUBCUTANEOUS | Status: AC
  Administered 2018-06-19: 10 [IU] via INTRAVENOUS

## 2018-06-19 MED ORDER — LATANOPROST 0.005 % OP SOLN
1.0000 [drp] | Freq: Every day | OPHTHALMIC | Status: DC
  Administered 2018-06-19: 1 [drp] via OPHTHALMIC
  Filled 2018-06-19: qty 2.5

## 2018-06-19 MED ORDER — ACETAMINOPHEN 650 MG RE SUPP: 650.0000 mg | Freq: Four times a day (QID) | RECTAL | Status: DC | PRN

## 2018-06-19 MED ORDER — PRAVASTATIN SODIUM 10 MG PO TABS
20.0000 mg | ORAL_TABLET | Freq: Every day | ORAL | Status: DC
  Administered 2018-06-19: 20 mg via ORAL
  Filled 2018-06-19: qty 2

## 2018-06-19 MED ORDER — GABAPENTIN 300 MG PO CAPS
300.0000 mg | ORAL_CAPSULE | Freq: Every day | ORAL | Status: DC
  Administered 2018-06-19: 300 mg via ORAL
  Filled 2018-06-19: qty 1

## 2018-06-19 MED ORDER — ONDANSETRON HCL 4 MG PO TABS: 4.0000 mg | ORAL_TABLET | Freq: Four times a day (QID) | ORAL | Status: DC | PRN

## 2018-06-19 MED ORDER — INSULIN REGULAR(HUMAN) IN NACL 100-0.9 UT/100ML-% IV SOLN: INTRAVENOUS | Status: DC

## 2018-06-19 MED ORDER — ONDANSETRON HCL 4 MG/2ML IJ SOLN: 4.0000 mg | Freq: Four times a day (QID) | INTRAMUSCULAR | Status: DC | PRN

## 2018-06-19 MED ORDER — ASPIRIN EC 81 MG PO TBEC
81.0000 mg | DELAYED_RELEASE_TABLET | Freq: Every day | ORAL | Status: DC
  Administered 2018-06-20: 81 mg via ORAL
  Filled 2018-06-19: qty 1

## 2018-06-19 MED ORDER — DOCUSATE SODIUM 100 MG PO CAPS
100.0000 mg | ORAL_CAPSULE | Freq: Two times a day (BID) | ORAL | Status: DC
  Administered 2018-06-19 – 2018-06-20 (×2): 100 mg via ORAL
  Filled 2018-06-19 (×2): qty 1

## 2018-06-19 MED ORDER — INSULIN DETEMIR 100 UNIT/ML ~~LOC~~ SOLN
30.0000 [IU] | Freq: Every day | SUBCUTANEOUS | Status: DC
  Administered 2018-06-19 – 2018-06-20 (×2): 30 [IU] via SUBCUTANEOUS
  Filled 2018-06-19 (×2): qty 0.3

## 2018-06-19 MED ORDER — OXYCODONE-ACETAMINOPHEN 5-325 MG PO TABS
1.0000 | ORAL_TABLET | Freq: Three times a day (TID) | ORAL | Status: DC | PRN
  Administered 2018-06-19 – 2018-06-20 (×2): 1 via ORAL
  Filled 2018-06-19 (×2): qty 1

## 2018-06-19 MED ORDER — INSULIN ASPART 100 UNIT/ML ~~LOC~~ SOLN
0.0000 [IU] | Freq: Every day | SUBCUTANEOUS | Status: DC
  Administered 2018-06-19: 4 [IU] via SUBCUTANEOUS

## 2018-06-19 MED ORDER — ENOXAPARIN SODIUM 40 MG/0.4ML ~~LOC~~ SOLN
40.0000 mg | SUBCUTANEOUS | Status: DC
  Administered 2018-06-19: 40 mg via SUBCUTANEOUS
  Filled 2018-06-19: qty 0.4

## 2018-06-19 NOTE — ED Triage Notes (Signed)
Pt in c/o dizziness with position change, states symptoms are improved when he is still but as soon as he tries to stand up or change position the dizziness improves, also c/o pressure in his right ear and hearing a popping sound

## 2018-06-19 NOTE — H&P (Signed)
History and Physical    Eduardo French:[id number] DOB: 02-08-1961 DOA: 06/19/2018  PCP: [facility] Consultants:  Ophelia Charter - orthopedics Patient coming from:  Home - lives alone; NOK: Children, [phone], Eduardo French  Chief Complaint:  Vertigo  HPI: Eduardo French is a 58 y.o. 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  He has noticed dizziness and falling, has been too weak to hold his telephone without dropping it.  He dropped his plate twice between yesterday and today.  He lost his Metformin several days ago and hasn't been taking this.  He has been using Levemir 30 units qAM and reports using this this AM.  He recently got over the flu and hasn't had much of an appetite, has been drinking a lot of juices.  He just hasn't had an appetite.  He has bene able to void without difficulty, but his kidneys have been aching.   ED Course:  Dizziness, weakness.  H/o DM with recent viral illness.  Labs showed glucose 700+.  No DKA but has acute kidney injury with creatinine 2.54, baseline 1.4.  Started IVF and [employer].  Has not been taking metformin but has been taking insulin.  Review of Systems: As per HPI; otherwise review of systems reviewed and negative.   Ambulatory Status:  Ambulates without assistance  Past Medical History:  Diagnosis Date  . ARF (acute renal failure) (HCC) 09/20/2017  . Arthritis    "right knee, left hip, right elbow, between my shoulder blades, neck" (09/21/2017)  . Bipolar disorder (HCC)   . Chronic headache    "in the back of my neck; only in the winter time" (09/21/2017)  . Chronic neck pain   . Chronic shoulder pain    "between shoulder blades" (09/21/2017)  . Depression   . DJD (degenerative joint disease) of cervical spine    on disability  . High cholesterol   . History of stomach ulcers 1980s   "they healed"  .  Hypertension   . Pneumonia    "maybe twice since childhood" (09/21/2017)  . S/P cardiac catheterization, 09/14/12 with mild non obstructive CAD and EF of 55% 09/15/2012  . Seizures (HCC)    "diabetic seizures when they first tried to get my RX right" (09/21/2017)  . Type II diabetes mellitus (HCC)     Past Surgical History:  Procedure Laterality Date  . ANTERIOR CERVICAL DECOMP/DISCECTOMY FUSION  2006  . BACK SURGERY    . LEFT HEART CATHETERIZATION WITH CORONARY ANGIOGRAM N/A 09/14/2012   Procedure: LEFT HEART CATHETERIZATION WITH CORONARY ANGIOGRAM;  Surgeon: Thurmon Fair, MD;  Location: [facility]ATH [facility];  Service: Cardiovascular;  Laterality: N/A;    Social History   Socioeconomic History  . Marital status: Legally Separated    Spouse name: Not on file  . Number of children: Not on file  . Years of education: Not on file  . Highest education level: Not on file  Occupational History  . Occupation: unemployed/disabled  Social Needs  . Financial resource strain: Not on file  . Food insecurity:    Worry: Not on file    Inability: Not on file  . Transportation needs:    Medical: Not on file    Non-medical: Not on file  Tobacco Use  . Smoking status: Current Every Day Smoker    Packs/day: 0.12    Years: 35.00    Pack years: 4.20  Types: Cigarettes  . Smokeless tobacco: Never Used  Substance and Sexual Activity  . Alcohol use: Yes    Alcohol/week: 39.0 standard drinks    Types: 2 Cans of beer, 37 Shots of liquor per week    Frequency: Never    Comment: "3 pints of vodka/wk"; last drink about 3 1/2 weeks ago  . Drug use: Yes    Types: Cocaine, Marijuana    Comment: occasional marijuana use; occasional cocaine use  . Sexual activity: Not Currently  Lifestyle  . Physical activity:    Days per week: Not on file    Minutes per session: Not on file  . Stress: Not on file  Relationships  . Social connections:    Talks on phone: Not on file    Gets together: Not on file     Attends religious service: Not on file    Active member of club or organization: Not on file    Attends meetings of clubs or organizations: Not on file    Relationship status: Not on file  . Intimate partner violence:    Fear of current or ex partner: Not on file    Emotionally abused: Not on file    Physically abused: Not on file    Forced sexual activity: Not on file  Other Topics Concern  . Not on file  Social History Narrative  . Not on file    Allergies  Allergen Reactions  . Citrus Hives and Rash    Pt states as long as he doesn't get too much into his system, can have about once a week.    Family History  Problem Relation Age of Onset  . Heart disease Brother        has had heart transplant  . Diabetes Mother   . Cancer Mother   . Hypertension Mother   . Cancer Brother   . Diabetes Brother   . Cancer Brother     Prior to Admission medications   Medication Sig Start Date End Date Taking? Authorizing Provider  aspirin 81 MG tablet Take 1 tablet (81 mg total) by mouth daily. For heart health Patient taking differently: Take 81 mg by mouth daily.  10/12/14  Yes Adonis BrookAgustin, Sheila, NP  escitalopram (LEXAPRO) 20 MG tablet Take 1 tablet (20 mg total) by mouth daily. 10/12/14  Yes Rankin, Shuvon B, NP  fluticasone (FLONASE) 50 MCG/ACT nasal spray Place 1 spray into both nostrils daily as needed for allergies or rhinitis. 10/12/14  Yes Adonis BrookAgustin, Sheila, NP  gabapentin (NEURONTIN) 300 MG capsule Take 300 mg by mouth at bedtime. 05/31/18  Yes [provider]  glipiZIDE (GLUCOTROL XL) 10 MG 24 hr tablet Take 10 mg by mouth 2 (two) times daily. 05/31/18  Yes [provider]  LEVEMIR FLEXTOUCH 100 UNIT/ML Pen Inject 30 Units into the skin daily.  11/30/17  Yes [provider]  lisinopril (PRINIVIL,ZESTRIL) 40 MG tablet Take 40 mg by mouth daily. 12/15/17  Yes [provider]  metFORMIN (GLUCOPHAGE) 1000 MG tablet Take 1 tablet (1,000 mg total) by mouth 2  (two) times daily with a meal. 10/12/14  Yes Rankin, Shuvon B, NP  Multiple Vitamin (MULTIVITAMIN WITH MINERALS) TABS tablet Take 1 tablet by mouth daily.   Yes [provider]  oxyCODONE-acetaminophen (PERCOCET) 10-325 MG tablet Take 1 tablet by mouth every 8 (eight) hours as needed for pain.   Yes [provider]  pravastatin (PRAVACHOL) 20 MG tablet Take 20 mg by mouth at bedtime.  11/30/17  Yes [provider]  TRAVATAN Z 0.004 % SOLN ophthalmic solution Place 1 drop into both eyes at bedtime. 04/30/18  Yes [provider]  benztropine (COGENTIN) 0.5 MG tablet Take 1 tablet (0.5 mg total) by mouth 2 (two) times daily. Patient not taking: Reported on 06/19/2018 10/12/14   Rankin, Shuvon B, NP  folic acid (FOLVITE) 1 MG tablet Take 1 tablet (1 mg total) by mouth daily. Patient not taking: Reported on 06/19/2018 09/22/17   Joseph Art, DO  haloperidol (HALDOL) 2 MG tablet Take 1 tablet (2 mg total) by mouth at bedtime. Patient not taking: Reported on 06/19/2018 10/12/14   Rankin, Shuvon B, NP  thiamine 100 MG tablet Take 1 tablet (100 mg total) by mouth daily. Patient not taking: Reported on 06/19/2018 09/22/17   Joseph Art, DO    Physical Exam: Vitals:   06/19/18 1300 06/19/18 1652  BP: 111/66 116/67  Pulse: (!) 106 77  Resp: 17 18  Temp: 98.7 F (37.1 C) 98 F (36.7 C)  TempSrc: Oral Oral  SpO2: 95% 96%  Weight:  95 kg  Height:  6' (1.829 m)     . General:  Appears calm and comfortable and is NAD . Eyes:  PERRL, EOMI, normal lids, iris . ENT:  grossly normal hearing, lips & tongue, mmm; suboptimal dentition . Neck:  no LAD, masses or thyromegaly; no carotid bruits . Cardiovascular:  RRR, no m/r/g. No LE edema.  Marland Kitchen Respiratory:   CTA bilaterally with no wheezes/rales/rhonchi.  Normal respiratory effort. . Abdomen:  soft, NT, ND, NABS . Back:   normal alignment, no CVAT . Skin:  no rash or induration seen on limited exam . Musculoskeletal:   grossly normal tone BUE/BLE, good ROM, no bony abnormality . Lower extremity:  No LE edema.  Limited foot exam with no ulcerations.  2+ distal pulses. Marland Kitchen Psychiatric:  grossly normal mood and affect, speech fluent and appropriate, AOx3, mildly grandiose, ?borderline manic with flirtatious behaviors . Neurologic:  CN 2-12 grossly intact, moves all extremities in coordinated fashion, sensation intact    Radiological Exams on Admission: No results found.  EKG: Independently reviewed.  NSR with rate 99; nonspecific ST changes with no evidence of acute ischemia; NSCSLT   Labs on Admission: I have personally reviewed the available labs and imaging studies at the time of the admission.  Pertinent labs:   Na++ 124 CO2 21 Glucose 720; 337 in 6/19 BUN 25/Creatinine 2.51/GFR 32; 24/1.45/>60 in 6/19 Normal CBC   Assessment/Plan Principal Problem:   Acute renal failure (ARF) (HCC) Active Problems:   HTN (hypertension), benign   Smoker   Bipolar disorder (HCC)   Uncontrolled diabetes mellitus type 2 without complications (HCC)   Polysubstance abuse (HCC)   Acute renal failure -Likely due to prerenal failure secondary to dehydration in the setting of recent viral illness and uncontrolled DM and continuation of ACEI and Glucophage. -IVF at 150 cc/hour -Follow up renal function by BMP -Avoid ACEI and NSAIDs as well as other nephrotoxic agents -Will observe overnight on Med Surg -Suspect that presenting dizziness was related to renal failure and/or uncontrolled DM and will resolve with treatment  Uncontrolled DM -He reports compliance with insulin and "recently" (within a few days) running out of Metformin. -Prior glucose was 317, 342 with A1c 12.3 in 5/19 and 337 in 6/19 -Anticipate that today's A1c will eclipse prior -He presented with glucose of 720 -He did not have evidence of DKA with a normal  anion gap and normal CO2 -As such, glucostabilizer was not started and the patient was  given 10 units IV insulin and his home Levemir (despite reporting taking it this AM, since this seems unlikely) and ordered for moderate-scale SSI including nighttime and mealtime coverage -With aggressive IVF and insulin, his glucose is expected to normalize by tomorrow -He will need close outpatient f/u  HTN -Hold ACE given renal failure -Currently normotensive -Could add hydralazine if needed  Bipolar d/o -Previously on Haldol and Cogentin but currently only taking Lexapro -He appears to be borderline manic at this time -Would recommend outpatient psych f/u  Polysubstance abuse -Cessation encouraged; this should be encouraged on an ongoing basis -UDS ordered  Tobacco dependence -Tobacco Dependence: encourage cessation.   -This was discussed with the patient and should be reviewed on an ongoing basis.   -Patch declined.     DVT prophylaxis:  Lovenox  Code Status:  Full - confirmed with patient Family Communication: None present Disposition Plan:  Home once clinically improved Consults called: None  Admission status: It is my clinical opinion that referral for OBSERVATION is reasonable and necessary in this patient based on the above information provided. The aforementioned taken together are felt to place the patient at high risk for further clinical deterioration. However it is anticipated that the patient may be medically stable for discharge from the hospital within 24 to 48 hours.    Jonah Blue MD [facility]   How to contact the [facility] Attending or Consulting provider 7A - 7P or covering provider during after hours 7P -7A, for this patient?  1. Check the care team in [facility] and look for a) attending/consulting [facility] provider listed and b) the [facility] team listed 2. Log into [email or web address] and use [place]'s universal password to access. If you do not have the password, please contact the hospital operator. 3. Locate the [facility] provider you are looking for under [facility] and page to a number that you can be directly reached. 4. If you still have difficulty reaching the provider, please page the [facility] (Director on Call) for the Hospitalists listed on amion for assistance.   06/19/2018, 5:28 PM

## 2018-06-19 NOTE — ED Provider Notes (Addendum)
[facility]   CSN: [id number] Arrival date & time: 06/19/18  1245     History   Chief Complaint Chief Complaint  Patient presents with  . Vertigo    HPI Eduardo French is a 58 y.o. male with history of diabetes, bipolar, htn presenting to emergency department with chief complaint of dizziness x2 days.  Patient reports that he had what he thinks to be the flu 2 weeks ago and had been taking Tylenol and TheraFlu.  He reports his flu symptoms of nasal congestion, fever,  resolved but he has continued to feel weak and dizzy. He describes the feeling as feeling like his head is spinning.  He did not take any medications for his symptoms prior to arrival.  He denies any associated pain.  Denies any nausea, visual changes, vomiting, diarrhea, fever, history of dizziness, ear pain.   Past Medical History:  Diagnosis Date  . ARF (acute renal failure) (HCC) 09/20/2017  . Arthritis    "right knee, left hip, right elbow, between my shoulder blades, neck" (09/21/2017)  . Bipolar disorder (HCC)   . Chronic headache    "in the back of my neck; only in the winter time" (09/21/2017)  . Chronic neck pain   . Chronic shoulder pain    "between shoulder blades" (09/21/2017)  . Depression   . DJD (degenerative joint disease) of cervical spine    on disability  . High cholesterol   . History of stomach ulcers 1980s   "they healed"  . Hypertension   . Pneumonia    "maybe twice since childhood" (09/21/2017)  . S/P cardiac catheterization, 09/14/12 with mild non obstructive CAD and EF of 55% 09/15/2012  . Seizures (HCC)    "diabetic seizures when they first tried to get my RX right" (09/21/2017)  . Type II diabetes mellitus [facility](HCC)     Patient Active Problem List   Diagnosis Date Noted  . ARF (acute renal failure) (HCC) 09/21/2017  . Chest pain 09/21/2017  . Left sided numbness 09/21/2017  . Alcohol abuse with intoxication (HCC) 10/08/2014  .  Cocaine abuse with intoxication and without complication (HCC) 10/08/2014  . Alcohol dependence with withdrawal, uncomplicated (HCC) 10/07/2014  . Cocaine abuse (HCC) 10/07/2014  . Substance induced mood disorder (HCC) 10/07/2014  . Cocaine use disorder, mild, abuse (HCC)   . Alcohol use disorder, moderate, dependence (HCC)   . Bipolar 1 disorder, depressed, severe (HCC) 06/10/2014  . Obesity (BMI 30.0-34.9) 04/11/2013  . Alcohol dependence (HCC) 11/06/2012  . Cocaine dependence (HCC) 11/06/2012  . Paranoid schizophrenia, chronic condition (HCC) 11/06/2012  . Alcohol abuse 11/05/2012  . S/P cardiac catheterization, 09/14/12 with mild non obstructive CAD and EF of 55% 09/15/2012  . HTN (hypertension), benign 09/12/2012  . DM type 2 (diabetes mellitus, type 2) (HCC) 09/12/2012  . BPH (benign prostatic hyperplasia) 09/12/2012  . Unstable angina, cardiac cath without obstruction 09/12/2012  . Smoker 09/12/2012  . 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 Depression 09/12/2012  . Bipolar disorder (HCC) 09/12/2012  . Dyslipidemia- (LDL 190 in 2011) 09/12/2012    Past Surgical History:  Procedure Laterality Date  . ANTERIOR CERVICAL DECOMP/DISCECTOMY FUSION  2006  . BACK SURGERY    . LEFT HEART CATHETERIZATION WITH CORONARY ANGIOGRAM N/A 09/14/2012   Procedure: LEFT HEART CATHETERIZATION WITH CORONARY ANGIOGRAM;  Surgeon: Thurmon FairMihai Croitoru, MD;  Location: [facility] CATH LAB;  Service: Cardiovascular;  Laterality: N/A;  Home Medications    Prior to Admission medications   Medication Sig Start Date End Date Taking? Authorizing Provider  aspirin 81 MG tablet Take 1 tablet (81 mg total) by mouth daily. For heart health Patient taking differently: Take 81 mg by mouth every other day.  10/12/14   Adonis Brook, NP  atorvastatin (LIPITOR) 40 MG tablet Take 1 tablet (40 mg total) by mouth daily. 10/12/14   Rankin, Shuvon B, NP  benztropine (COGENTIN) 0.5 MG tablet Take 1 tablet (0.5  mg total) by mouth 2 (two) times daily. Patient taking differently: Take 0.5 mg by mouth daily.  10/12/14   Rankin, Shuvon B, NP  Carboxymethylcellulose Sodium (EYE DROPS OP) Apply 2 drops to eye 2 (two) times daily. Right eye for cataract    [provider]  cyclobenzaprine (FLEXERIL) 10 MG tablet Take 1 tablet (10 mg total) by mouth 3 (three) times daily as needed for muscle spasms. Patient not taking: Reported on 12/14/2017 10/14/17   Street, [place], PA-C  escitalopram (LEXAPRO) 20 MG tablet Take 1 tablet (20 mg total) by mouth daily. Patient taking differently: Take 10 mg by mouth 2 (two) times daily.  10/12/14   Rankin, Shuvon B, NP  fluticasone (FLONASE) 50 MCG/ACT nasal spray Place 1 spray into both nostrils daily as needed for allergies or rhinitis. 10/12/14   Adonis Brook, NP  folic acid (FOLVITE) 1 MG tablet Take 1 tablet (1 mg total) by mouth daily. 09/22/17   Joseph Art, DO  haloperidol (HALDOL) 2 MG tablet Take 1 tablet (2 mg total) by mouth at bedtime. 10/12/14   Rankin, Shuvon B, NP  LEVEMIR FLEXTOUCH 100 UNIT/ML Pen Inject 30 Units into the skin every 16 (sixteen) hours. 11/30/17   [provider]  lisinopril (PRINIVIL,ZESTRIL) 40 MG tablet Take 40 mg by mouth daily. 12/15/17   [provider]  Menthol-Methyl Salicylate (MUSCLE RUB) 10-15 % CREA Apply 1 application topically 2 (two) times daily. Patient not taking: Reported on 12/14/2017 09/22/17   Joseph Art, DO  metFORMIN (GLUCOPHAGE) 1000 MG tablet Take 1 tablet (1,000 mg total) by mouth 2 (two) times daily with a meal. 10/12/14   Rankin, Shuvon B, NP  oxyCODONE-acetaminophen (PERCOCET) 10-325 MG tablet Take 1 tablet by mouth every 8 (eight) hours as needed for pain.    [provider]  pravastatin (PRAVACHOL) 20 MG tablet Take 20 mg by mouth at bedtime. 11/30/17   [provider]  thiamine 100 MG tablet Take 1 tablet (100 mg total) by mouth daily. 09/22/17   Joseph Art, DO     Family History Family History  Problem Relation Age of Onset  . Heart disease Brother        has had heart transplant  . Diabetes Mother   . Cancer Mother   . Hypertension Mother   . Cancer Brother   . Diabetes Brother   . Cancer Brother     Social History Social History   Tobacco Use  . Smoking status: Current Every Day Smoker    Packs/day: 0.12    Years: 35.00    Pack years: 4.20    Types: Cigarettes  . Smokeless tobacco: Never Used  Substance Use Topics  . Alcohol use: Yes    Alcohol/week: 39.0 standard drinks    Types: 2 Cans of beer, 37 Shots of liquor per week    Frequency: Never    Comment: 09/21/2017 "3 pints of vodka/wk"  . Drug use: Not Currently  Types: Cocaine    Comment: 09/21/2017 "nothing in the last few weeks"     Allergies   Citrus   Review of Systems Review of Systems  Constitutional: Negative for chills and fever.  HENT: Negative for congestion, sinus pressure and sore throat.   Eyes: Negative for pain and visual disturbance.  Respiratory: Negative for chest tightness and shortness of breath.   Cardiovascular: Negative for chest pain and palpitations.  Gastrointestinal: Negative for abdominal pain, diarrhea, nausea and vomiting.  Genitourinary: Negative for difficulty urinating and hematuria.  Musculoskeletal: Negative for back pain and neck pain.  Skin: Negative for rash and wound.  Neurological: Positive for dizziness. Negative for syncope and headaches.     Physical Exam Updated Vital Signs BP 111/66 (BP Location: Right Arm)   Pulse (!) 106   Temp 98.7 F (37.1 C) (Oral)   Resp 17   SpO2 95%   Physical Exam Vitals signs and nursing note reviewed.  Constitutional:      Appearance: He is not ill-appearing or toxic-appearing.  HENT:     Head: Normocephalic and atraumatic.     Right Ear: Tympanic membrane, ear canal and external ear normal.     Left Ear: Tympanic membrane, ear canal and external ear normal.     Nose: Nose  normal.     Mouth/Throat:     Mouth: Mucous membranes are moist.     Pharynx: Oropharynx is clear.  Eyes:     Conjunctiva/sclera: Conjunctivae normal.  Neck:     Musculoskeletal: Normal range of motion. No muscular tenderness.  Cardiovascular:     Rate and Rhythm: Regular rhythm. Tachycardia present.     Pulses: Normal pulses.     Heart sounds: Normal heart sounds.  Pulmonary:     Effort: Pulmonary effort is normal.     Breath sounds: Normal breath sounds.  Abdominal:     General: There is no distension.     Palpations: Abdomen is soft.     Tenderness: There is no abdominal tenderness. There is no guarding or rebound.  Musculoskeletal: Normal range of motion.  Lymphadenopathy:     Cervical: No cervical adenopathy.  Skin:    General: Skin is warm and dry.     Capillary Refill: Capillary refill takes less than 2 seconds.  Neurological:     Mental Status: He is alert. Mental status is at baseline.     Motor: No weakness.     Comments: Speech is clear and goal oriented, follows commands CN III-XII intact, no facial droop Normal strength in upper and lower extremities bilaterally including dorsiflexion and plantar flexion, strong and equal grip strength Sensation normal to light and sharp touch Moves extremities without ataxia, coordination intact Normal finger to nose and rapid alternating movements Normal gait and balance   Psychiatric:        Behavior: Behavior normal.      ED Treatments / Results  Labs (all labs ordered are listed, but only abnormal results are displayed) Labs Reviewed - No data to display  EKG None  Radiology No results found.  Procedures Procedures (including critical care time)  Medications Ordered in ED Medications - No data to display   Initial Impression / Assessment and Plan / ED Course  I have reviewed the triage vital signs and the nursing notes.  Pertinent labs & imaging results that were available during my care of the patient  were reviewed by me and considered in my medical decision making (see chart for details).  Pt is well-appearing, nontoxic, afebrile.  He is reporting 2 days of dizziness.  His story is difficult to discern if it is more of a lightheaded versus dizziness.  Neuro exam is without focal deficit, no nystagmus.  Will initiate work-up with CBC, BMP, IV fluids, EKG.  Orthostatic vitals are negative.  Will reassess after fluids.  I viewed his EKG and it is without ischemic changes, unchanged from prior. His BMP shows hyponatremia of 124, glucose of 720, BUN/creatinine of 25/2.51. Looking at previous lab results, patient had elevated BUN/Creatinine of  24/1.45 x 8 months go. Patient does not have an elevated anion gap, his labs are not suggestive of DKA.   His BMP is suggestive of an acute kidney injury.  He has had one in the past, and states that he thinks about it his symptoms were similar last time.  Patient states he has been taking his insulin and home medications except he has not taken his metformin x4 days because he lost the bottle.  Glucose stabilizer initiated.  Pt agreeable to admission and will be admitted to the hospitalist.   Pt case discussed with Dr. Fredderick Phenix who agrees with my plan.    Final Clinical Impressions(s) / ED Diagnoses   Final diagnoses:  None    ED Discharge Orders    None       Sherene Sires, PA-C 06/19/18 1601    Rolan Bucco, MD 06/19/18 1646    Albrizze, Caroleen Hamman, PA-C 06/19/18 1721    Rolan Bucco, MD 06/19/18 1723

## 2018-06-20 DIAGNOSIS — E1165 Type 2 diabetes mellitus with hyperglycemia: Secondary | ICD-10-CM | POA: Diagnosis not present

## 2018-06-20 DIAGNOSIS — Z794 Long term (current) use of insulin: Secondary | ICD-10-CM

## 2018-06-20 DIAGNOSIS — R739 Hyperglycemia, unspecified: Secondary | ICD-10-CM | POA: Diagnosis not present

## 2018-06-20 DIAGNOSIS — N178 Other acute kidney failure: Secondary | ICD-10-CM | POA: Diagnosis not present

## 2018-06-20 DIAGNOSIS — E860 Dehydration: Secondary | ICD-10-CM

## 2018-06-20 LAB — GLUCOSE, CAPILLARY
GLUCOSE-CAPILLARY: 256 mg/dL — AB (ref 70–99)
Glucose-Capillary: 328 mg/dL — ABNORMAL HIGH (ref 70–99)

## 2018-06-20 LAB — BASIC METABOLIC PANEL
ANION GAP: 6 (ref 5–15)
BUN: 16 mg/dL (ref 6–20)
CO2: 25 mmol/L (ref 22–32)
Calcium: 8.6 mg/dL — ABNORMAL LOW (ref 8.9–10.3)
Chloride: 101 mmol/L (ref 98–111)
Creatinine, Ser: 1.14 mg/dL (ref 0.61–1.24)
GFR calc Af Amer: >60 mL/min (ref >60)
GFR calc non Af Amer: >60 mL/min (ref >60)
Glucose, Bld: 242 mg/dL — ABNORMAL HIGH (ref 70–99)
POTASSIUM: 3.4 mmol/L — AB (ref 3.5–5.1)
Sodium: 132 mmol/L — ABNORMAL LOW (ref 135–145)

## 2018-06-20 LAB — CBC
HCT: 38.7 % — ABNORMAL LOW (ref 39.0–52.0)
Hemoglobin: 13.6 g/dL (ref 13.0–17.0)
MCH: 29.6 pg (ref 26.0–34.0)
MCHC: 35.1 g/dL (ref 30.0–36.0)
MCV: 84.1 fL (ref 80.0–100.0)
Platelets: 334 10*3/uL (ref 150–400)
RBC: 4.6 MIL/uL (ref 4.22–5.81)
RDW: 11.5 % (ref 11.5–15.5)
WBC: 7.4 10*3/uL (ref 4.0–10.5)
nRBC: 0 % (ref 0.0–0.2)

## 2018-06-20 MED ORDER — LISINOPRIL 40 MG PO TABS: 40.0000 mg | ORAL_TABLET | Freq: Every day | ORAL | 3 refills | Status: DC

## 2018-06-20 MED ORDER — METFORMIN HCL 1000 MG PO TABS: 1000.0000 mg | ORAL_TABLET | Freq: Two times a day (BID) | ORAL | 0 refills | Status: DC

## 2018-06-20 NOTE — Discharge Summary (Signed)
Physician Discharge Summary  Eduardo French EHU:[id number] DOB: May 09, 1960 DOA: 06/19/2018  PCP: [facility]  Admit date: 06/19/2018 Discharge date: 06/20/2018  Admitted From: home Discharge disposition: home   Recommendations for Outpatient Follow-Up:   HGbA1c- follow to final Holding BP medication-- resume at next visit if BP improved BMP 1 week  Discharge Diagnosis:   Principal Problem:   Acute renal failure (ARF) (HCC) Active Problems:   HTN (hypertension), benign   Smoker   Bipolar disorder (HCC)   Uncontrolled diabetes mellitus type 2 without complications (HCC)   Polysubstance abuse (HCC)    Discharge Condition: Improved.  Diet recommendation: Low sodium, heart healthy.  Carbohydrate-modified  Wound care: None.  Code status: Full.   History of Present Illness:   Eduardo French is a 58 y.o. 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  He has noticed dizziness and falling, has been too weak to hold his telephone without dropping it.  He dropped his plate twice between yesterday and today.  He lost his Metformin several days ago and hasn't been taking this.  He has been using Levemir 30 units qAM and reports using this this AM.  He recently got over the flu and hasn't had much of an appetite, has been drinking a lot of juices.  He just hasn't had an appetite.  He has bene able to void without difficulty, but his kidneys have been aching.   Hospital Course by Problem:  AKI Likely due to prerenal failure secondary to dehydration in the setting of recent viral illness and uncontrolled DM and continuation of ACEI and Glucophage. -much improved this AM -Avoid ACEI and NSAIDs as well as other nephrotoxic agents -Suspect that presenting dizziness was related to renal failure and/or uncontrolled DM   Uncontrolled DM -He reports  compliance with insulin and "recently" (within a few days) running out of Metformin. -Prior glucose was 317, 342 with A1c 12.3 in 5/19 and 337 in 6/19 -He presented with glucose of 720 -He did not have evidence of DKA with a normal anion gap and normal CO2 -As such, glucostabilizer was not started and the patient was given 10 units IV insulin and his home Levemir (despite reporting taking it this AM, since this seems unlikely)  -He will need close outpatient f/u  HTN -Hold ACE given renal failure -BP currently on the lower side  Bipolar d/o -Previously on Haldol and Cogentin but currently only taking Lexapro -He appears to be borderline manic at this time -Would recommend outpatient psych f/u  Tobacco dependence -encourage cessation.       Medical Consultants:      Discharge Exam:   Vitals:   06/20/18 0400 06/20/18 1013  BP: (!) 95/54   Pulse: 77   Resp: 18   Temp: 97.6 F (36.4 C)   SpO2: 93% 93%   Vitals:   06/19/18 1300 06/19/18 1652 06/20/18 0400 06/20/18 1013  BP: 111/66 116/67 (!) 95/54   Pulse: (!) 106 77 77   Resp: 17 18 18    Temp: 98.7 F (37.1 C) 98 F (36.7 C) 97.6 F (36.4 C)   TempSrc: Oral Oral Oral   SpO2: 95% 96% 93% 93%  Weight:  95 kg    Height:  6' (1.829 m)      General exam: Appears calm and comfortable.     The results of significant  diagnostics from this hospitalization (including imaging, microbiology, ancillary and laboratory) are listed below for reference.     Procedures and Diagnostic Studies:   No results found.   Labs:   Basic Metabolic Panel: Recent Labs  Lab 06/19/18 1348 06/20/18 0512  NA 124* 132*  K 4.3 3.4*  CL 89* 101  CO2 21* 25  GLUCOSE 720* 242*  BUN 25* 16  CREATININE 2.51* 1.14  CALCIUM 9.6 8.6*   GFR Estimated Creatinine Clearance: 85.5 mL/min (by C-G formula based on SCr of 1.14 mg/dL). Liver Function Tests: No results for input(s): AST, ALT, ALKPHOS, BILITOT, PROT, ALBUMIN in the last  168 hours. No results for input(s): LIPASE, AMYLASE in the last 168 hours. No results for input(s): AMMONIA in the last 168 hours. Coagulation profile No results for input(s): INR, PROTIME in the last 168 hours.  CBC: Recent Labs  Lab 06/19/18 1348 06/20/18 0512  WBC 8.7 7.4  NEUTROABS 5.1  --   HGB 15.3 13.6  HCT 43.3 38.7*  MCV 84.6 84.1  PLT 383 334   Cardiac Enzymes: No results for input(s): CKTOTAL, CKMB, CKMBINDEX, TROPONINI in the last 168 hours. BNP: Invalid input(s): POCBNP CBG: Recent Labs  Lab 06/19/18 1716 06/19/18 1749 06/19/18 2058 06/20/18 0636 06/20/18 1134  GLUCAP 485* 438* 317* 256* 328*   D-Dimer No results for input(s): DDIMER in the last 72 hours. Hgb A1c No results for input(s): HGBA1C in the last 72 hours. Lipid Profile No results for input(s): CHOL, HDL, LDLCALC, TRIG, CHOLHDL, LDLDIRECT in the last 72 hours. Thyroid function studies No results for input(s): TSH, T4TOTAL, T3FREE, THYROIDAB in the last 72 hours.  Invalid input(s): FREET3 Anemia work up No results for input(s): VITAMINB12, FOLATE, FERRITIN, TIBC, IRON, RETICCTPCT in the last 72 hours. Microbiology No results found for this or any previous visit (from the past 240 hour(s)).   Discharge Instructions:   Discharge Instructions    Diet - low sodium heart healthy   Complete by:  As directed    Diet Carb Modified   Complete by:  As directed    Discharge instructions   Complete by:  As directed    Would hold BP medication until seen by PCP and BP rechecked or if you check your BP at home, your top number is >120 on several readings   Increase activity slowly   Complete by:  As directed      Allergies as of 06/20/2018      Reactions   Citrus Hives, Rash   Pt states as long as he doesn't get too much into his system, can have about once a week.      Medication List    TAKE these medications   aspirin 81 MG tablet Take 1 tablet (81 mg total) by mouth daily. For heart  health What changed:  additional instructions   escitalopram 20 MG tablet Commonly known as:  LEXAPRO Take 1 tablet (20 mg total) by mouth daily.   fluticasone 50 MCG/ACT nasal spray Commonly known as:  FLONASE Place 1 spray into both nostrils daily as needed for allergies or rhinitis.   gabapentin 300 MG capsule Commonly known as:  NEURONTIN Take 300 mg by mouth at bedtime.   glipiZIDE 10 MG 24 hr tablet Commonly known as:  GLUCOTROL XL Take 10 mg by mouth 2 (two) times daily.   LEVEMIR FLEXTOUCH 100 UNIT/ML Pen Generic drug:  Insulin Detemir Inject 30 Units into the skin daily.   lisinopril 40 MG tablet  Commonly known as:  PRINIVIL,ZESTRIL Take 1 tablet (40 mg total) by mouth daily. Start taking on:  June 24, 2018 What changed:  These instructions start on June 24, 2018. If you are unsure what to do until then, ask your doctor or other care provider.   metFORMIN 1000 MG tablet Commonly known as:  GLUCOPHAGE Take 1 tablet (1,000 mg total) by mouth 2 (two) times daily with a meal.   multivitamin with minerals Tabs tablet Take 1 tablet by mouth daily.   oxyCODONE-acetaminophen 10-325 MG tablet Commonly known as:  PERCOCET Take 1 tablet by mouth every 8 (eight) hours as needed for pain.   pravastatin 20 MG tablet Commonly known as:  PRAVACHOL Take 20 mg by mouth at bedtime.   TRAVATAN Z 0.004 % Soln ophthalmic solution Generic drug:  Travoprost (BAK Free) Place 1 drop into both eyes at bedtime.      Follow-up Information    [facility] Follow up in 1 week(s).   Why:  for BP check and labs Contact information: [address] [place] [address] [phone]            Time coordinating discharge: 25 min  Signed:  Joseph Art DO  [facility] 06/20/2018, 11:45 AM

## 2018-06-21 LAB — HEMOGLOBIN A1C
Hgb A1c MFr Bld: 15 % — ABNORMAL HIGH (ref 4.8–5.6)
Mean Plasma Glucose: 384 mg/dL

## 2019-06-03 ENCOUNTER — Emergency Department (HOSPITAL_COMMUNITY)
Admission: EM | Admit: 2019-06-03 | Discharge: 2019-06-03 | Disposition: A | Discharge status: Home / Self Care | Payer: Medicaid Other | Attending: Emergency Medicine | Admitting: Emergency Medicine

## 2019-06-03 ENCOUNTER — Emergency Department (HOSPITAL_COMMUNITY): Payer: Medicaid Other

## 2019-06-03 ENCOUNTER — Other Ambulatory Visit: Payer: Self-pay

## 2019-06-03 DIAGNOSIS — U071 COVID-19: Secondary | ICD-10-CM | POA: Diagnosis not present

## 2019-06-03 DIAGNOSIS — Z79899 Other long term (current) drug therapy: Secondary | ICD-10-CM | POA: Insufficient documentation

## 2019-06-03 DIAGNOSIS — Z7984 Long term (current) use of oral hypoglycemic drugs: Secondary | ICD-10-CM | POA: Diagnosis not present

## 2019-06-03 DIAGNOSIS — F17210 Nicotine dependence, cigarettes, uncomplicated: Secondary | ICD-10-CM | POA: Insufficient documentation

## 2019-06-03 DIAGNOSIS — I10 Essential (primary) hypertension: Secondary | ICD-10-CM | POA: Diagnosis not present

## 2019-06-03 DIAGNOSIS — R1032 Left lower quadrant pain: Secondary | ICD-10-CM

## 2019-06-03 DIAGNOSIS — E1165 Type 2 diabetes mellitus with hyperglycemia: Secondary | ICD-10-CM | POA: Diagnosis not present

## 2019-06-03 DIAGNOSIS — Z7982 Long term (current) use of aspirin: Secondary | ICD-10-CM | POA: Insufficient documentation

## 2019-06-03 DIAGNOSIS — R1030 Lower abdominal pain, unspecified: Secondary | ICD-10-CM | POA: Diagnosis present

## 2019-06-03 LAB — COMPREHENSIVE METABOLIC PANEL
ALT: 19 U/L (ref 0–44)
AST: 31 U/L (ref 15–41)
Albumin: 4.2 g/dL (ref 3.5–5.0)
Alkaline Phosphatase: 86 U/L (ref 38–126)
Anion gap: 12 (ref 5–15)
BUN: 11 mg/dL (ref 6–20)
CO2: 23 mmol/L (ref 22–32)
Calcium: 9.7 mg/dL (ref 8.9–10.3)
Chloride: 98 mmol/L (ref 98–111)
Creatinine, Ser: 1.24 mg/dL (ref 0.61–1.24)
GFR calc Af Amer: >60 mL/min (ref >60)
GFR calc non Af Amer: >60 mL/min (ref >60)
Glucose, Bld: 428 mg/dL — ABNORMAL HIGH (ref 70–99)
Potassium: 4.4 mmol/L (ref 3.5–5.1)
Sodium: 133 mmol/L — ABNORMAL LOW (ref 135–145)
Total Bilirubin: 1.3 mg/dL — ABNORMAL HIGH (ref 0.3–1.2)
Total Protein: 7.7 g/dL (ref 6.5–8.1)

## 2019-06-03 LAB — URINALYSIS, ROUTINE W REFLEX MICROSCOPIC
Bacteria, UA: NONE SEEN
Bilirubin Urine: NEGATIVE
Glucose, UA: >=500 mg/dL — AB
Hgb urine dipstick: NEGATIVE
Ketones, ur: 5 mg/dL — AB
Leukocytes,Ua: NEGATIVE
Nitrite: NEGATIVE
Protein, ur: NEGATIVE mg/dL
Specific Gravity, Urine: 1.029 (ref 1.005–1.030)
pH: 6 (ref 5.0–8.0)

## 2019-06-03 LAB — CBC WITH DIFFERENTIAL/PLATELET
Abs Immature Granulocytes: 0.01 10*3/uL (ref 0.00–0.07)
Basophils Absolute: 0 10*3/uL (ref 0.0–0.1)
Basophils Relative: 1 %
Eosinophils Absolute: 0 10*3/uL (ref 0.0–0.5)
Eosinophils Relative: 1 %
HCT: 50.1 % (ref 39.0–52.0)
Hemoglobin: 17.6 g/dL — ABNORMAL HIGH (ref 13.0–17.0)
Immature Granulocytes: 0 %
Lymphocytes Relative: 49 %
Lymphs Abs: 1.8 10*3/uL (ref 0.7–4.0)
MCH: 30.3 pg (ref 26.0–34.0)
MCHC: 35.1 g/dL (ref 30.0–36.0)
MCV: 86.2 fL (ref 80.0–100.0)
Monocytes Absolute: 0.4 10*3/uL (ref 0.1–1.0)
Monocytes Relative: 10 %
Neutro Abs: 1.4 10*3/uL — ABNORMAL LOW (ref 1.7–7.7)
Neutrophils Relative %: 39 %
Platelets: 218 10*3/uL (ref 150–400)
RBC: 5.81 MIL/uL (ref 4.22–5.81)
RDW: 11.6 % (ref 11.5–15.5)
WBC: 3.7 10*3/uL — ABNORMAL LOW (ref 4.0–10.5)
nRBC: 0 % (ref 0.0–0.2)

## 2019-06-03 LAB — RESPIRATORY PANEL BY RT PCR (FLU A&B, COVID)
Influenza A by PCR: NEGATIVE
Influenza B by PCR: NEGATIVE
SARS Coronavirus 2 by RT PCR: POSITIVE — AB

## 2019-06-03 LAB — LIPASE, BLOOD: Lipase: 38 U/L (ref 11–51)

## 2019-06-03 LAB — TROPONIN I (HIGH SENSITIVITY): Troponin I (High Sensitivity): 4 ng/L (ref <18)

## 2019-06-03 LAB — CBG MONITORING, ED: Glucose-Capillary: 390 mg/dL — ABNORMAL HIGH (ref 70–99)

## 2019-06-03 MED ORDER — ONDANSETRON 4 MG PO TBDP: 4.0000 mg | ORAL_TABLET | Freq: Three times a day (TID) | ORAL | 0 refills | Status: AC | PRN

## 2019-06-03 MED ORDER — METHOCARBAMOL 500 MG PO TABS: 500.0000 mg | ORAL_TABLET | Freq: Two times a day (BID) | ORAL | 0 refills | Status: AC

## 2019-06-03 MED ORDER — MORPHINE SULFATE (PF) 4 MG/ML IV SOLN
4.0000 mg | Freq: Once | INTRAVENOUS | Status: AC
  Administered 2019-06-03: 4 mg via INTRAVENOUS
  Filled 2019-06-03: qty 1

## 2019-06-03 MED ORDER — ONDANSETRON HCL 4 MG/2ML IJ SOLN
4.0000 mg | Freq: Once | INTRAMUSCULAR | Status: AC
  Administered 2019-06-03: 4 mg via INTRAVENOUS
  Filled 2019-06-03: qty 2

## 2019-06-03 MED ORDER — SODIUM CHLORIDE 0.9 % IV BOLUS
1000.0000 mL | Freq: Once | INTRAVENOUS | Status: AC
  Administered 2019-06-03: 1000 mL via INTRAVENOUS

## 2019-06-03 NOTE — ED Notes (Signed)
Patient verbalizes understanding of discharge instructions. Opportunity for questioning and answers were provided. Armband removed by staff, pt discharged from ED.  

## 2019-06-03 NOTE — ED Notes (Signed)
Pt transported to CT ?

## 2019-06-03 NOTE — ED Triage Notes (Signed)
Pt presents to the ED with bilateral flank pain and abdominal pain with intermittent fevers and chills. Reports this has been going on for about three days. Pt denies and dysuria or urinary symptoms but does report "kidney pain" when he urinates. Pt is alert and oriented x4 at present time. Reports 8/10 bilateral flank pain at present time.

## 2019-06-03 NOTE — ED Notes (Signed)
Pt transported to xray 

## 2019-06-03 NOTE — ED Notes (Signed)
Per Dr. Particia Nearing second troponin is not needed.

## 2019-06-03 NOTE — ED Provider Notes (Signed)
[facility] EMERGENCY DEPARTMENT Provider Note   CSN: [id number] Arrival date & time: 06/03/19  0740     History Chief Complaint  Patient presents with  . Flank Pain  . Abdominal Pain    Eduardo French is a 59 y.o. male.  Pt presents to the ED today with bilateral lower abdominal pain, but more on the left.  Pt has had fevers and chills.  He's had a cough and some congestion.  He's been taking Mucinex.  No known Covid exposures.  He denies any urinary sx.  He did not take any of his meds today.  He has never had anything like this in the past.        Past Medical History:  Diagnosis Date  . ARF (acute renal failure) (HCC) 09/20/2017  . Arthritis    "right knee, left hip, right elbow, between my shoulder blades, neck" (09/21/2017)  . Bipolar disorder (HCC)   . Chronic headache    "in the back of my neck; only in the winter time" (09/21/2017)  . Chronic neck pain   . Chronic shoulder pain    "between shoulder blades" (09/21/2017)  . Depression   . DJD (degenerative joint disease) of cervical spine    on disability  . High cholesterol   . History of stomach ulcers 1980s   "they healed"  . Hypertension   . Pneumonia    "maybe twice since childhood" (09/21/2017)  . S/P cardiac catheterization, 09/14/12 with mild non obstructive CAD and EF of 55% 09/15/2012  . Seizures (HCC)    "diabetic seizures when they first tried to get my RX right" (09/21/2017)  . Type II diabetes mellitus [facility])     Patient Active Problem List   Diagnosis Date Noted  . Acute renal failure (ARF) (HCC) 06/19/2018  . Uncontrolled diabetes mellitus type 2 without complications 06/19/2018  . Polysubstance abuse (HCC) 06/19/2018  . ARF (acute renal failure) (HCC) 09/21/2017  . Chest pain 09/21/2017  . Left sided numbness 09/21/2017  . Alcohol abuse with intoxication (HCC) 10/08/2014  . Cocaine abuse with intoxication and without complication (HCC) 10/08/2014  . Alcohol dependence  with withdrawal, uncomplicated (HCC) 10/07/2014  . Cocaine abuse (HCC) 10/07/2014  . Substance induced mood disorder (HCC) 10/07/2014  . Cocaine use disorder, mild, abuse (HCC)   . Alcohol use disorder, moderate, dependence (HCC)   . Bipolar 1 disorder, depressed, severe (HCC) 06/10/2014  . Obesity (BMI 30.0-34.9) 04/11/2013  . Alcohol dependence (HCC) 11/06/2012  . Cocaine dependence (HCC) 11/06/2012  . Paranoid schizophrenia, chronic condition (HCC) 11/06/2012  . Alcohol abuse 11/05/2012  . S/P cardiac catheterization, 09/14/12 with mild non obstructive CAD and EF of 55% 09/15/2012  . HTN (hypertension), benign 09/12/2012  . DM type 2 (diabetes mellitus, type 2) (HCC) 09/12/2012  . BPH (benign prostatic hyperplasia) 09/12/2012  . Unstable angina, cardiac cath without obstruction 09/12/2012  . Smoker 09/12/2012  . 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 Depression 09/12/2012  . Bipolar disorder (HCC) 09/12/2012  . Dyslipidemia- (LDL 190 in 2011) 09/12/2012    Past Surgical History:  Procedure Laterality Date  . ANTERIOR CERVICAL DECOMP/DISCECTOMY FUSION  2006  . BACK SURGERY    . LEFT HEART CATHETERIZATION WITH CORONARY ANGIOGRAM N/A 09/14/2012   Procedure: LEFT HEART CATHETERIZATION WITH CORONARY ANGIOGRAM;  Surgeon: Thurmon Fair, MD;  Location: [facility] CATH LAB;  Service: Cardiovascular;  Laterality: N/A;       Family History  Problem Relation Age  of Onset  . Heart disease Brother        has had heart transplant  . Diabetes Mother   . Cancer Mother   . Hypertension Mother   . Cancer Brother   . Diabetes Brother   . Cancer Brother     Social History   Tobacco Use  . Smoking status: Current Every Day Smoker    Packs/day: 0.12    Years: 35.00    Pack years: 4.20    Types: Cigarettes  . Smokeless tobacco: Never Used  Substance Use Topics  . Alcohol use: Yes    Alcohol/week: 39.0 standard drinks    Types: 2 Cans of beer, 37 Shots of liquor per week     Comment: "3 pints of vodka/wk"; last drink about 3 1/2 weeks ago  . Drug use: Yes    Types: Cocaine, Marijuana    Comment: occasional marijuana use; occasional cocaine use    Home Medications Prior to Admission medications   Medication Sig Start Date End Date Taking? Authorizing Provider  aspirin 81 MG tablet Take 1 tablet (81 mg total) by mouth daily. For heart health Patient taking differently: Take 81 mg by mouth daily.  10/12/14  Yes Adonis Brook, NP  escitalopram (LEXAPRO) 20 MG tablet Take 1 tablet (20 mg total) by mouth daily. 10/12/14  Yes Rankin, Shuvon B, NP  fluticasone (FLONASE) 50 MCG/ACT nasal spray Place 1 spray into both nostrils daily as needed for allergies or rhinitis. 10/12/14  Yes Adonis Brook, NP  gabapentin (NEURONTIN) 300 MG capsule Take 300 mg by mouth at bedtime. 05/31/18  Yes [provider]  glipiZIDE (GLUCOTROL XL) 10 MG 24 hr tablet Take 10 mg by mouth 2 (two) times daily. 05/31/18  Yes [provider]  LEVEMIR FLEXTOUCH 100 UNIT/ML Pen Inject 16-20 Units into the skin 2 (two) times daily.  11/30/17  Yes [provider]  Multiple Vitamin (MULTIVITAMIN WITH MINERALS) TABS tablet Take 1 tablet by mouth daily.   Yes [provider]  pravastatin (PRAVACHOL) 20 MG tablet Take 20 mg by mouth at bedtime. 11/30/17  Yes [provider]  TRAVATAN Z 0.004 % SOLN ophthalmic solution Place 1 drop into both eyes at bedtime. 04/30/18  Yes [provider]  benztropine (COGENTIN) 0.5 MG tablet Take 0.5 mg by mouth 2 (two) times daily.    [provider]  haloperidol (HALDOL) 5 MG tablet Take 2.5 mg by mouth daily.    [provider]  methocarbamol (ROBAXIN) 500 MG tablet Take 1 tablet (500 mg total) by mouth 2 (two) times daily. 06/03/19   Jacalyn Lefevre, MD  ondansetron (ZOFRAN ODT) 4 MG disintegrating tablet Take 1 tablet (4 mg total) by mouth every 8 (eight) hours as needed. 06/03/19   Jacalyn Lefevre, MD    oxyCODONE-acetaminophen (PERCOCET) 10-325 MG tablet Take 1 tablet by mouth every 8 (eight) hours as needed for pain.    [provider]    Allergies    Citrus  Review of Systems   Review of Systems  Constitutional: Positive for chills and fever.  Respiratory: Positive for cough.   Gastrointestinal: Positive for abdominal pain.  All other systems reviewed and are negative.   Physical Exam Updated Vital Signs BP (!) 100/54   Pulse 60   Temp 98.1 F (36.7 C) (Oral)   Resp 14   Ht 6' (1.829 m)   Wt 93 kg   SpO2 97%   BMI 27.80 kg/m   Physical Exam Vitals  and nursing note reviewed.  Constitutional:      Appearance: He is well-developed.  HENT:     Head: Normocephalic and atraumatic.     Mouth/Throat:     Mouth: Mucous membranes are moist.  Eyes:     Extraocular Movements: Extraocular movements intact.     Pupils: Pupils are equal, round, and reactive to light.  Cardiovascular:     Rate and Rhythm: Normal rate and regular rhythm.     Heart sounds: Normal heart sounds.  Pulmonary:     Effort: Pulmonary effort is normal.     Breath sounds: Normal breath sounds.  Abdominal:     General: Abdomen is flat. Bowel sounds are normal.     Palpations: Abdomen is soft.     Tenderness: There is abdominal tenderness in the left lower quadrant.  Skin:    General: Skin is warm.     Capillary Refill: Capillary refill takes less than 2 seconds.  Neurological:     General: No focal deficit present.     Mental Status: He is alert and oriented to person, place, and time.  Psychiatric:        Mood and Affect: Mood normal.        Behavior: Behavior normal.     ED Results / Procedures / Treatments   Labs (all labs ordered are listed, but only abnormal results are displayed) Labs Reviewed  RESPIRATORY PANEL BY RT PCR (FLU A&B, COVID) - Abnormal; Notable for the following components:      Result Value   SARS Coronavirus 2 by RT PCR POSITIVE (*)    All other components  within normal limits  COMPREHENSIVE METABOLIC PANEL - Abnormal; Notable for the following components:   Sodium 133 (*)    Glucose, Bld 428 (*)    Total Bilirubin 1.3 (*)    All other components within normal limits  CBC WITH DIFFERENTIAL/PLATELET - Abnormal; Notable for the following components:   WBC 3.7 (*)    Hemoglobin 17.6 (*)    Neutro Abs 1.4 (*)    All other components within normal limits  URINALYSIS, ROUTINE W REFLEX MICROSCOPIC - Abnormal; Notable for the following components:   Glucose, UA >=500 (*)    Ketones, ur 5 (*)    All other components within normal limits  CBG MONITORING, ED - Abnormal; Notable for the following components:   Glucose-Capillary 390 (*)    All other components within normal limits  LIPASE, BLOOD  TROPONIN I (HIGH SENSITIVITY)  TROPONIN I (HIGH SENSITIVITY)    EKG EKG Interpretation  Date/Time:  Friday June 03 2019 07:52:19 EST Ventricular Rate:  87 PR Interval:    QRS Duration: 78 QT Interval:  361 QTC Calculation: 435 R Axis:   -37 Text Interpretation: Sinus rhythm Left axis deviation Minimal ST elevation, anterior leads No significant change since last tracing Confirmed by Jacalyn Lefevre [phone]) on 06/03/2019 8:03:36 AM   Radiology CT ABDOMEN PELVIS WO CONTRAST  Result Date: 06/03/2019 CLINICAL DATA:  59 year old male with a history of flank pain, abdominal pain, fevers, concern for diverticulitis EXAM: CT ABDOMEN AND PELVIS WITHOUT CONTRAST TECHNIQUE: Multidetector CT imaging of the abdomen and pelvis was performed following the standard protocol without IV contrast. COMPARISON:  03/18/2006 FINDINGS: Lower chest: No acute finding of the lower chest Hepatobiliary: Unremarkable liver.  Unremarkable gallbladder. Pancreas: Unremarkable pancreas Spleen: Unremarkable Adrenals/Urinary Tract: - Right adrenal gland:  Unremarkable - Left adrenal gland: Unremarkable. - Right kidney: No hydronephrosis, nephrolithiasis, inflammation, or ureteral  dilation. No focal lesion. - Left Kidney: No hydronephrosis, nephrolithiasis, inflammation, or ureteral dilation. No focal lesion. - Urinary Bladder: Unremarkable. Stomach/Bowel: - Stomach: Small hiatal hernia.  Otherwise unremarkable stomach. - Small bowel: Unremarkable - Appendix: Normal. - Colon: No significant stool burden. No focal inflammatory changes. Minimal diverticula. No wall thickening. Vascular/Lymphatic: Mild atherosclerotic changes of the aorta and the iliac arteries. Small inguinal lymph nodes, no different than the comparison CT. No pelvic lymphadenopathy. No retroperitoneal adenopathy. Small nodes of small bowel mesentery, slightly more conspicuous than the comparison CT, within the upper abdomen. No significant inflammatory changes. Reproductive: Unremarkable pelvic structures. Other: None Musculoskeletal: No acute displaced fracture. Mild degenerative changes. IMPRESSION: No acute CT finding to account for abdominal pain. Electronically Signed   By: Corrie Mckusick D.O.   On: 06/03/2019 09:42   DG Chest 2 View  Result Date: 06/03/2019 CLINICAL DATA:  Chest pain. EXAM: CHEST - 2 VIEW COMPARISON:  09/20/2017. FINDINGS: Mediastinum and hilar structures normal. Mild bibasilar subsegmental atelectasis. No pleural effusion or pneumothorax. Borderline cardiomegaly, no pulmonary venous congestion. No acute bony abnormality prior cervical spine fusion. IMPRESSION: 1.  Mild bibasilar subsegmental atelectasis. 2.  Borderline cardiomegaly.  No pulmonary venous congestion. Electronically Signed   By: Marcello Moores  Register   On: 06/03/2019 08:42    Procedures Procedures (including critical care time)  Medications Ordered in ED Medications  sodium chloride 0.9 % bolus 1,000 mL (0 mLs Intravenous Stopped 06/03/19 1004)  ondansetron (ZOFRAN) injection 4 mg (4 mg Intravenous Given 06/03/19 0810)  morphine 4 MG/ML injection 4 mg (4 mg Intravenous Given 06/03/19 0810)    ED Course  I have reviewed the triage  vital signs and the nursing notes.  Pertinent labs & imaging results that were available during my care of the patient were reviewed by me and considered in my medical decision making (see chart for details).    MDM Rules/Calculators/A&P                     Pt's abdominal pain work up is unremarkable.  He is feeling better.  Pt is hyperglycemic, but has not taken any of his meds.  He is told to take his meds when he gets home.  Pt is + for covid.  He now tells me that he has not been able to taste and smell for a few days.  Pt told to self-isolate and to wear his mask.  He is oxygenating well and looks nontoxic.  He is stable for d/c.  Eduardo French was evaluated in Emergency Department on 06/03/2019 for the symptoms described in the history of present illness. He was evaluated in the context of the global COVID-19 pandemic, which necessitated consideration that the patient might be at risk for infection with the SARS-CoV-2 virus that causes COVID-19. Institutional protocols and algorithms that pertain to the evaluation of patients at risk for COVID-19 are in a state of rapid change based on information released by regulatory bodies including the CDC and federal and state organizations. These policies and algorithms were followed during the patient's care in the ED.  Final Clinical Impression(s) / ED Diagnoses Final diagnoses:  [id number] virus infection  Left lower quadrant abdominal pain  Hyperglycemia due to diabetes mellitus ([place])    Rx / DC Orders ED Discharge Orders         Ordered    ondansetron (ZOFRAN ODT) 4 MG disintegrating tablet  Every 8 hours PRN     06/03/19 1006  methocarbamol (ROBAXIN) 500 MG tablet  2 times daily     06/03/19 1006           Jacalyn LefevreHaviland, Lourie Retz, MD 06/03/19 1009

## 2019-06-03 NOTE — Discharge Instructions (Signed)
-   Take your medications when you get home

## 2019-06-04 ENCOUNTER — Telehealth: Payer: Self-pay | Admitting: Physician Assistant

## 2019-06-04 NOTE — Telephone Encounter (Signed)
Called to discuss with patient about Covid symptoms and the use of bamlanivimab or casirivimab/imdevimab, a monoclonal antibody infusion for those with mild to moderate Covid symptoms and at a high risk of hospitalization.  Pt is qualified for this infusion at the [facility] infusion center due to Diabetes   Pt asked that I call  Eduardo French back this PM  Cline Crock PA-C  [facility]

## 2019-09-05 ENCOUNTER — Telehealth: Payer: Self-pay

## 2019-09-05 NOTE — Telephone Encounter (Signed)
NOTES ON FILE FROM [facility] [phone], SENT REFERRAL TO SCHEDULING 

## 2019-09-06 ENCOUNTER — Ambulatory Visit: Payer: Medicaid Other | Admitting: Orthopaedic Surgery

## 2019-09-16 ENCOUNTER — Other Ambulatory Visit: Payer: Self-pay

## 2019-09-16 ENCOUNTER — Ambulatory Visit (INDEPENDENT_AMBULATORY_CARE_PROVIDER_SITE_OTHER): Payer: Medicaid Other | Admitting: Orthopaedic Surgery

## 2019-09-16 ENCOUNTER — Ambulatory Visit (INDEPENDENT_AMBULATORY_CARE_PROVIDER_SITE_OTHER): Payer: Medicaid Other

## 2019-09-16 ENCOUNTER — Encounter: Payer: Self-pay | Admitting: Orthopaedic Surgery

## 2019-09-16 VITALS — BP 113/67 | Ht 71.5 in | Wt 209.0 lb

## 2019-09-16 DIAGNOSIS — G8929 Other chronic pain: Secondary | ICD-10-CM

## 2019-09-16 DIAGNOSIS — M25552 Pain in left hip: Secondary | ICD-10-CM

## 2019-09-16 DIAGNOSIS — M25511 Pain in right shoulder: Secondary | ICD-10-CM
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
## 2019-10-10 ENCOUNTER — Ambulatory Visit: Payer: Medicaid Other | Admitting: Cardiology

## 2019-10-10 ENCOUNTER — Encounter: Payer: Self-pay | Admitting: Cardiology

## 2019-10-10 ENCOUNTER — Other Ambulatory Visit: Payer: Self-pay

## 2019-10-10 VITALS — BP 110/70 | HR 91 | Ht 71.5 in | Wt 183.0 lb

## 2019-10-10 DIAGNOSIS — I2583 Coronary atherosclerosis due to lipid rich plaque: Secondary | ICD-10-CM | POA: Diagnosis not present

## 2019-10-10 DIAGNOSIS — I2510 Atherosclerotic heart disease of native coronary artery without angina pectoris: Secondary | ICD-10-CM | POA: Diagnosis not present

## 2019-10-10 DIAGNOSIS — I10 Essential (primary) hypertension: Secondary | ICD-10-CM

## 2019-10-10 NOTE — Progress Notes (Signed)
Cardiology Office Note:    Date:  10/10/2019   ID:  Eduardo French, DOB 12-Jul-1960, MRN [id number]  PCP:  [facility]  [facility] Cardiologist:  Candee Furbish, MD  [facility] Electrophysiologist:  None   Referring MD: Drue Flirt, MD     History of Present Illness:    Eduardo French is a 59 y.o. male here for evaluation of prior myocardial infarction at the request of Dr. Quentin Cornwall.  Back in 2014 underwent cardiac catheterization that showed mild nonobstructive CAD with a EF of 55%.  Polysubstance abuse in the past.  Was visiting son in [place] in 2020 and had a mild heart attack.  Severe CP.  Does not recall having an angiogram performed.  Remembers being told something about the underside of his heart.  Left hip pain, replacement pending  Has stopped taking atorvastatin because of dizziness.  Had COVID-19 in January 2021.  Loss of taste and smell.  Past Medical History:  Diagnosis Date  . Acute renal failure (ARF) ([place]) 06/19/2018  . Alcohol dependence ([place]) 11/06/2012  . ARF (acute renal failure) ([place]) 09/20/2017  . Arthritis    "right knee, left hip, right elbow, between my shoulder blades, neck" (09/21/2017)  . Bipolar disorder ([place])   . BPH (benign prostatic hyperplasia) 09/12/2012  . Chest pain 09/21/2017  . Chronic headache    "in the back of my neck; only in the winter time" (09/21/2017)  . Chronic neck pain   . Chronic shoulder pain    "between shoulder blades" (09/21/2017)  . Cocaine abuse ([place]) 10/07/2014  . Cocaine abuse with intoxication and without complication ([place]) 4/0/9811  . Cocaine dependence ([place]) 11/06/2012  . Cocaine use disorder, mild, abuse ([place])   . Depression   . DJD (degenerative joint disease) of cervical spine    on disability  . 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 DM type 2 (diabetes mellitus, type 2) ([place]) 09/12/2012  . Dyslipidemia- (LDL 190 in 2011) 09/12/2012  . High cholesterol   .  History of stomach ulcers 1980s   "they healed"  . HTN (hypertension), benign 09/12/2012  . Hypertension   . Left sided numbness 09/21/2017  . Obesity (BMI 30.0-34.9) 04/11/2013  . Paranoid schizophrenia, chronic condition ([place]) 11/06/2012  . Pneumonia    "maybe twice since childhood" (09/21/2017)  . Polysubstance abuse ([place]) 06/19/2018  . S/P cardiac catheterization, 09/14/12 with mild non obstructive CAD and EF of 55% 09/15/2012  . Seizures ([place])    "diabetic seizures when they first tried to get my RX right" (09/21/2017)  . Smoker 09/12/2012  . Substance induced mood disorder ([place]) 10/07/2014  . Type II diabetes mellitus ([place])   . Uncontrolled diabetes mellitus type 2 without complications 01/17/7828  . Unstable angina, cardiac cath without obstruction 09/12/2012    Past Surgical History:  Procedure Laterality Date  . ANTERIOR CERVICAL DECOMP/DISCECTOMY FUSION  2006  . BACK SURGERY    . LEFT HEART CATHETERIZATION WITH CORONARY ANGIOGRAM N/A 09/14/2012   Procedure: LEFT HEART CATHETERIZATION WITH CORONARY ANGIOGRAM;  Surgeon: Sanda Klein, MD;  Location: [facility];  Service: Cardiovascular;  Laterality: N/A;    Current Medications: Current Meds  Medication Sig  . aspirin 81 MG tablet Take 1 tablet (81 mg total) by mouth daily. For heart health  . benztropine (COGENTIN) 0.5 MG tablet Take 0.5 mg by mouth 2 (two) times daily.  Marland Kitchen escitalopram (LEXAPRO) 20 MG tablet Take 1 tablet (20  mg total) by mouth daily.  . fluticasone (FLONASE) 50 MCG/ACT nasal spray Place 1 spray into both nostrils daily as needed for allergies or rhinitis.  Marland Kitchen gabapentin (NEURONTIN) 300 MG capsule Take 300 mg by mouth at bedtime.  Marland Kitchen glipiZIDE (GLUCOTROL XL) 10 MG 24 hr tablet Take 10 mg by mouth 2 (two) times daily.  . haloperidol (HALDOL) 5 MG tablet Take 2.5 mg by mouth daily.  Marland Kitchen LEVEMIR FLEXTOUCH 100 UNIT/ML Pen Inject 16-20 Units into the skin 2 (two) times daily.   . methocarbamol (ROBAXIN) 500 MG tablet Take 1  tablet (500 mg total) by mouth 2 (two) times daily.  . Multiple Vitamin (MULTIVITAMIN WITH MINERALS) TABS tablet Take 1 tablet by mouth daily.  . ondansetron (ZOFRAN ODT) 4 MG disintegrating tablet Take 1 tablet (4 mg total) by mouth every 8 (eight) hours as needed.  Marland Kitchen oxyCODONE-acetaminophen (PERCOCET) 10-325 MG tablet Take 1 tablet by mouth every 8 (eight) hours as needed for pain.  . pravastatin (PRAVACHOL) 20 MG tablet Take 20 mg by mouth at bedtime.  . TRAVATAN Z 0.004 % SOLN ophthalmic solution Place 1 drop into both eyes at bedtime.     Allergies:   Citrus   Social History   Socioeconomic History  . Marital status: Legally Separated    Spouse name: Not on file  . Number of children: Not on file  . Years of education: Not on file  . Highest education level: Not on file  Occupational History  . Occupation: unemployed/disabled  Tobacco Use  . Smoking status: Current Every Day Smoker    Packs/day: 0.12    Years: 35.00    Pack years: 4.20    Types: Cigarettes  . Smokeless tobacco: Never Used  Substance and Sexual Activity  . Alcohol use: Yes    Alcohol/week: 39.0 standard drinks    Types: 2 Cans of beer, 37 Shots of liquor per week    Comment: "3 pints of vodka/wk"; last drink about 3 1/2 weeks ago  . Drug use: Yes    Types: Cocaine, Marijuana    Comment: occasional marijuana use; occasional cocaine use  . Sexual activity: Not Currently  Other Topics Concern  . Not on file  Social History Narrative  . Not on file   Social Determinants of Health   Financial Resource Strain:   . Difficulty of Paying Living Expenses:   Food Insecurity:   . Worried About Programme researcher, broadcasting/film/video in the Last Year:   . Barista in the Last Year:   Transportation Needs:   . Freight forwarder (Medical):   Marland Kitchen Lack of Transportation (Non-Medical):   Physical Activity:   . Days of Exercise per Week:   . Minutes of Exercise per Session:   Stress:   . Feeling of Stress :   Social  Connections:   . Frequency of Communication with Friends and Family:   . Frequency of Social Gatherings with Friends and Family:   . Attends Religious Services:   . Active Member of Clubs or Organizations:   . Attends Banker Meetings:   Marland Kitchen Marital Status:      Family History: The patient's family history includes Cancer in his brother, brother, and mother; Diabetes in his brother and mother; Heart disease in his brother; Hypertension in his mother.  ROS:   Please see the history of present illness.     All other systems reviewed and are negative.  EKGs/Labs/Other Studies Reviewed:  The following studies were reviewed today: Prior office notes reviewed  EKG: Sinus rhythm with left axis deviation  Recent Labs: 06/03/2019: ALT 19; BUN 11; Creatinine, Ser 1.24; Hemoglobin 17.6; Platelets 218; Potassium 4.4; Sodium 133  Recent Lipid Panel    Component Value Date/Time   CHOL 229 (H) 06/11/2014 1922   TRIG 454 (H) 06/11/2014 1922   HDL 45 06/11/2014 1922   CHOLHDL 5.1 06/11/2014 1922   VLDL UNABLE TO CALCULATE IF TRIGLYCERIDE OVER 400 mg/dL 16/02/9603 5409   LDLCALC UNABLE TO CALCULATE IF TRIGLYCERIDE OVER 400 mg/dL 81/19/1478 2956    Physical Exam:    VS:  BP 110/70   Pulse 91   Ht 5' 11.5" (1.816 m)   Wt 183 lb (83 kg)   SpO2 98%   BMI 25.17 kg/m     Wt Readings from Last 3 Encounters:  10/10/19 183 lb (83 kg)  09/16/19 209 lb (94.8 kg)  06/03/19 205 lb (93 kg)     GEN:  Well nourished, well developed in no acute distress HEENT: Normal NECK: No JVD; No carotid bruits LYMPHATICS: No lymphadenopathy CARDIAC: RRR, no murmurs, rubs, gallops RESPIRATORY:  Clear to auscultation without rales, wheezing or rhonchi  ABDOMEN: Soft, non-tender, non-distended MUSCULOSKELETAL:  No edema; No deformity  SKIN: Warm and dry NEUROLOGIC:  Alert and oriented x 3 PSYCHIATRIC:  Normal affect   ASSESSMENT:    1. HTN (hypertension), benign   2. Coronary artery  disease due to lipid rich plaque    PLAN:    In order of problems listed above:  Coronary artery disease -Previously nonobstructive on heart catheterization in 2014. -Had mild heart attack in November 2020 while visiting his son in [place]. -Has brother had elevated heart transplant. -I will check an echocardiogram to ensure proper structure and function of his heart.  Continue with aspirin.  Diabetes with hypertension -Lisinopril for renal protection previously utilized.  Currently he is not on any antihypertensives.  His record does say lisinopril 5 mg from referring physician.  Blood pressure has been soft.  Orthostatics were negative today.  Hyperlipidemia -On pravastatin.  Continue.  LDL goal less than 70.  Lipids were checked at primary office.  Medication Adjustments/Labs and Tests Ordered: Current medicines are reviewed at length with the patient today.  Concerns regarding medicines are outlined above.  Orders Placed This Encounter  Procedures  . ECHOCARDIOGRAM COMPLETE   No orders of the defined types were placed in this encounter.   Patient Instructions  Medication Instructions:  The current medical regimen is effective;  continue present plan and medications.  *If you need a refill on your cardiac medications before your next appointment, please call your pharmacy*  Testing/Procedures: Your physician has requested that you have an echocardiogram. Echocardiography is a painless test that uses sound waves to create images of your heart. It provides your doctor with information about the size and shape of your heart and how well your heart's chambers and valves are working. This procedure takes approximately one hour. There are no restrictions for this procedure.  Follow-Up: At [facility], you and your health needs are our priority.  As part of our continuing mission to provide you with exceptional heart care, we have created designated Provider Care Teams.  These  Care Teams include your primary Cardiologist (physician) and Advanced Practice Providers (APPs -  Physician Assistants and Nurse Practitioners) who all work together to provide you with the care you need, when you need it.  We recommend signing  up for the patient portal called "MyChart".  Sign up information is provided on this After Visit Summary.  MyChart is used to connect with patients for Virtual Visits (Telemedicine).  Patients are able to view lab/test results, encounter notes, upcoming appointments, etc.  Non-urgent messages can be sent to your provider as well.   To learn more about what you can do with MyChart, go to [email or web address].    Your next appointment:   6 month(s)  The format for your next appointment:   In Person  Provider:   Donato Schultz, MD   Thank you for choosing [facility]!!         Signed, Donato Schultz, MD  10/10/2019 2:53 PM    [facility]

## 2019-10-10 NOTE — Patient Instructions (Signed)
Medication Instructions:  °The current medical regimen is effective;  continue present plan and medications. ° °*If you need a refill on your cardiac medications before your next appointment, please call your pharmacy* ° °Testing/Procedures: °Your physician has requested that you have an echocardiogram. Echocardiography is a painless test that uses sound waves to create images of your heart. It provides your doctor with information about the size and shape of your heart and how well your heart’s chambers and valves are working. This procedure takes approximately one hour. There are no restrictions for this procedure. ° °Follow-Up: °At [facility], you and your health needs are our priority.  As part of our continuing mission to provide you with exceptional heart care, we have created designated Provider Care Teams.  These Care Teams include your primary Cardiologist (physician) and Advanced Practice Providers (APPs -  Physician Assistants and Nurse Practitioners) who all work together to provide you with the care you need, when you need it. ° °We recommend signing up for the patient portal called "MyChart".  Sign up information is provided on this After Visit Summary.  MyChart is used to connect with patients for Virtual Visits (Telemedicine).  Patients are able to view lab/test results, encounter notes, upcoming appointments, etc.  Non-urgent messages can be sent to your provider as well.   °To learn more about what you can do with MyChart, go to [email or web address].   ° °Your next appointment:   °6 month(s) ° °The format for your next appointment:   °In Person ° °Provider:   °Mark Skains, MD { ° ° ° °Thank you for choosing [facility]!! ° ° ° °

## 2019-10-28 ENCOUNTER — Other Ambulatory Visit (HOSPITAL_COMMUNITY): Payer: Medicaid Other

## 2019-11-03 ENCOUNTER — Encounter (HOSPITAL_COMMUNITY): Payer: Self-pay | Admitting: Cardiology

## 2019-11-14 ENCOUNTER — Telehealth (HOSPITAL_COMMUNITY): Payer: Self-pay | Admitting: Cardiology

## 2019-11-14 NOTE — Telephone Encounter (Signed)
Just an FYI. We have made several attempts to contact this patient including sending a letter to schedule or reschedule their echocardiogram. We will be removing the patient from the echo WQ.   11/03/2019 MAILED LETTER /LBW (still no response from patient )  [facility] to reschedule no show echo on 10/28/19 @ 11:03/LBW   Thank you

## 2019-11-14 NOTE — Telephone Encounter (Signed)
Noted! Thank you

## 2019-12-27 ENCOUNTER — Other Ambulatory Visit (HOSPITAL_COMMUNITY): Payer: Medicaid Other

## 2020-01-10 ENCOUNTER — Encounter: Payer: Self-pay | Admitting: Orthopaedic Surgery

## 2020-01-10 ENCOUNTER — Ambulatory Visit (INDEPENDENT_AMBULATORY_CARE_PROVIDER_SITE_OTHER): Payer: Medicaid Other | Admitting: Orthopaedic Surgery

## 2020-01-10 VITALS — BP 149/92 | HR 93 | Ht 71.5 in | Wt 184.4 lb

## 2020-01-10 DIAGNOSIS — M1612 Unilateral primary osteoarthritis, left hip: Secondary | ICD-10-CM | POA: Diagnosis not present

## 2020-01-10 DIAGNOSIS — M25552 Pain in left hip: Secondary | ICD-10-CM

## 2020-01-10 NOTE — Progress Notes (Signed)
Office Visit Note   Patient: Eduardo French           Date of Birth: 05-25-1960           MRN: [id number] Visit Date: 01/10/2020              Requested by: Medicine, [facility] [address] [place],  [place] [address] PCP: [facility], [facility]   Assessment & Plan: Visit Diagnoses:  1. Pain in left hip   2. Unilateral primary osteoarthritis, left hip     Plan: We will proceed with MRI scan of his left hip to rule out avascular necrosis.  Patient does have moderate hip osteoarthritis with spurring and some joint space loss.  Office follow-up after scan for review.    Follow-Up Inst.ructions : No follow-ups on file.   Orders:  Orders Placed This Encounter  Procedures  . MR Hip Left w/o contrast   No orders of the defined types were placed in this encounter.     Procedures: No procedures performed   Clinical Data: No additional findings.   Subjective: Chief Complaint  Patient presents with  . Left Hip - Follow-up, Pain    Ready to discuss getting replaced    HPI 59 year old with his left hip and states he is ready to schedule left total hip arthroplasty.  Patient states the cold weather bothers it and is not sure he can make it through another winter.  He also is having some problems with right shoulder pain particularly with outstretched reaching and overhead lifting.  Previous cardiac catheterization 2014 showed some mild nonobstructive coronary artery disease and ejection fraction of 55%.  He has had past history of cocaine and alcohol abuse none currently.  He is used anti-inflammatories Tylenol, Aleve and a cane for his hip pain.  Minimal problems with his right hip.  Patient not working.  Review of Systems positive for type 2 diabetes with poor compliance A1c 06/19/2018 was 15.  Previous cervical spine fusion Dr. Ezzard Standing.   Objective: Vital Signs: BP (!) 149/92 (BP Location: Left Arm, Patient Position: Sitting)   Pulse 93   Ht 5'  11.5" (1.816 m)   Wt 184 lb 6.4 oz (83.6 kg)   BMI 25.36 kg/m   Physical Exam Constitutional:      Appearance: He is well-developed.  HENT:     Head: Normocephalic and atraumatic.  Eyes:     Pupils: Pupils are equal, round, and reactive to light.  Neck:     Thyroid: No thyromegaly.     Trachea: No tracheal deviation.  Cardiovascular:     Rate and Rhythm: Normal rate.  Pulmonary:     Effort: Pulmonary effort is normal.     Breath sounds: No wheezing.  Abdominal:     General: Bowel sounds are normal.     Palpations: Abdomen is soft.  Skin:    General: Skin is warm and dry.     Capillary Refill: Capillary refill takes less than 2 seconds.  Neurological:     Mental Status: He is alert and oriented to person, place, and time.  Psychiatric:        Behavior: Behavior normal.        Thought Content: Thought content normal.        Judgment: Judgment normal.     Ortho Exam patient ambulates with a slight left hip limp.  Some pain with internal rotation at 30 degrees.  Palpable pedal pulse good knee  range of motion.  Specialty Comments:  No specialty comments available.  Imaging: No results found.   PMFS History: Patient Active Problem List   Diagnosis Date Noted  . Unilateral primary osteoarthritis, left hip 01/10/2020  . Acute renal failure (ARF) (HCC) 06/19/2018  . Uncontrolled diabetes mellitus type 2 without complications 06/19/2018  . Polysubstance abuse (HCC) 06/19/2018  . ARF (acute renal failure) (HCC) 09/21/2017  . Chest pain 09/21/2017  . Left sided numbness 09/21/2017  . Alcohol abuse with intoxication (HCC) 10/08/2014  . Cocaine abuse with intoxication and without complication (HCC) 10/08/2014  . Alcohol dependence with withdrawal, uncomplicated (HCC) 10/07/2014  . Cocaine abuse (HCC) 10/07/2014  . Substance induced mood disorder (HCC) 10/07/2014  . Cocaine use disorder, mild, abuse (HCC)   . Alcohol use disorder, moderate, dependence (HCC)   . Bipolar 1  disorder, depressed, severe (HCC) 06/10/2014  . Obesity (BMI 30.0-34.9) 04/11/2013  . Alcohol dependence (HCC) 11/06/2012  . Cocaine dependence (HCC) 11/06/2012  . Paranoid schizophrenia, chronic condition (HCC) 11/06/2012  . Alcohol abuse 11/05/2012  . S/P cardiac catheterization, 09/14/12 with mild non obstructive CAD and EF of 55% 09/15/2012  . HTN (hypertension), benign 09/12/2012  . DM type 2 (diabetes mellitus, type 2) (HCC) 09/12/2012  . BPH (benign prostatic hyperplasia) 09/12/2012  . Unstable angina, cardiac cath without obstruction 09/12/2012  . Smoker 09/12/2012  . 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 Depression 09/12/2012  . Bipolar disorder (HCC) 09/12/2012  . Dyslipidemia- (LDL 190 in 2011) 09/12/2012   Past Medical History:  Diagnosis Date  . Acute renal failure (ARF) (HCC) 06/19/2018  . Alcohol dependence (HCC) 11/06/2012  . ARF (acute renal failure) (HCC) 09/20/2017  . Arthritis    "right knee, left hip, right elbow, between my shoulder blades, neck" (09/21/2017)  . Bipolar disorder (HCC)   . BPH (benign prostatic hyperplasia) 09/12/2012  . Chest pain 09/21/2017  . Chronic headache    "in the back of my neck; only in the winter time" (09/21/2017)  . Chronic neck pain   . Chronic shoulder pain    "between shoulder blades" (09/21/2017)  . Cocaine abuse (HCC) 10/07/2014  . Cocaine abuse with intoxication and without complication (HCC) 10/08/2014  . Cocaine dependence (HCC) 11/06/2012  . Cocaine use disorder, mild, abuse (HCC)   . Depression   . DJD (degenerative joint disease) of cervical spine    on disability  . 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 DM type 2 (diabetes mellitus, type 2) (HCC) 09/12/2012  . Dyslipidemia- (LDL 190 in 2011) 09/12/2012  . High cholesterol   . History of stomach ulcers 1980s   "they healed"  . HTN (hypertension), benign 09/12/2012  . Hypertension   . Left sided numbness 09/21/2017  . Obesity (BMI 30.0-34.9)  04/11/2013  . Paranoid schizophrenia, chronic condition (HCC) 11/06/2012  . Pneumonia    "maybe twice since childhood" (09/21/2017)  . Polysubstance abuse (HCC) 06/19/2018  . S/P cardiac catheterization, 09/14/12 with mild non obstructive CAD and EF of 55% 09/15/2012  . Seizures (HCC)    "diabetic seizures when they first tried to get my RX right" (09/21/2017)  . Smoker 09/12/2012  . Substance induced mood disorder (HCC) 10/07/2014  . Type II diabetes mellitus (HCC)   . Uncontrolled diabetes mellitus type 2 without complications 06/19/2018  . Unstable angina, cardiac cath without obstruction 09/12/2012    Family History  Problem Relation Age of Onset  . Heart disease Brother  has had heart transplant  . Diabetes Mother   . Cancer Mother   . Hypertension Mother   . Cancer Brother   . Diabetes Brother   . Cancer Brother     Past Surgical History:  Procedure Laterality Date  . ANTERIOR CERVICAL DECOMP/DISCECTOMY FUSION  2006  . BACK SURGERY    . LEFT HEART CATHETERIZATION WITH CORONARY ANGIOGRAM N/A 09/14/2012   Procedure: LEFT HEART CATHETERIZATION WITH CORONARY ANGIOGRAM;  Surgeon: Thurmon Fair, MD;  Location: [facility];  Service: Cardiovascular;  Laterality: N/A;   Social History   Occupational History  . Occupation: unemployed/disabled  Tobacco Use  . Smoking status: Current Every Day Smoker    Packs/day: 0.12    Years: 35.00    Pack years: 4.20    Types: Cigarettes  . Smokeless tobacco: Never Used  Vaping Use  . Vaping Use: Never used  Substance and Sexual Activity  . Alcohol use: Yes    Alcohol/week: 39.0 standard drinks    Types: 2 Cans of beer, 37 Shots of liquor per week    Comment: "3 pints of vodka/wk"; last drink about 3 1/2 weeks ago  . Drug use: Yes    Types: Cocaine, Marijuana    Comment: occasional marijuana use; occasional cocaine use  . Sexual activity: Not Currently

## 2020-01-29 ENCOUNTER — Ambulatory Visit
Admission: RE | Admit: 2020-01-29 | Discharge: 2020-01-29 | Disposition: A | Discharge status: Home / Self Care | Payer: Medicaid Other | Source: Ambulatory Visit | Attending: Orthopaedic Surgery | Admitting: Orthopaedic Surgery

## 2020-01-29 DIAGNOSIS — M25552 Pain in left hip: Secondary | ICD-10-CM

## 2020-02-14 ENCOUNTER — Ambulatory Visit (INDEPENDENT_AMBULATORY_CARE_PROVIDER_SITE_OTHER): Payer: Medicaid Other | Admitting: Orthopaedic Surgery

## 2020-02-14 DIAGNOSIS — M1612 Unilateral primary osteoarthritis, left hip: Secondary | ICD-10-CM | POA: Diagnosis not present

## 2020-02-14 NOTE — Progress Notes (Signed)
Office Visit Note   Patient: Eduardo French           Date of Birth: 1960/05/20           MRN: [id number] Visit Date: 02/14/2020              Requested by: Medicine, [facility] [address] [place],  [place] [address] PCP: Medicine, [facility]   Assessment & Plan: Visit Diagnoses:  1. Unilateral primary osteoarthritis, left hip     Plan: Patient continued cane return in 6 months with standing AP both hips frog-leg lateral left hip.  MRI report was reviewed I gave him a copy and we reviewed images.  His hip at this point is not severe enough to proceed with total hip arthroplasty.  Recheck 6 months he will return earlier if he has significant increase in symptoms.  Follow-Up Instructions: Return in about 1 month (around 03/16/2020).   Orders:  No orders of the defined types were placed in this encounter.  No orders of the defined types were placed in this encounter.     Procedures: No procedures performed   Clinical Data: No additional findings.   Subjective: No chief complaint on file.   HPI 59 year old male returns post MRI scan of his hips.  He has left groin pain and pain with ambulation is using a cane but denies problems with his right hip.  History of alcohol abuse bipolar disorder renal failure in the past type 2 diabetes, substance abuse.  He states that times few days a month he does not go out of the house due to increased pain in his left groin and lateral hip region.  Plain radiographs that showed slight joint space narrowing and spurring.  Review of Systems 14 point update unchanged from previous office visit other than as mentioned in HPI.   Objective: Vital Signs: There were no vitals taken for this visit.  Physical Exam Constitutional:      Appearance: He is well-developed.  HENT:     Head: Normocephalic and atraumatic.  Eyes:     Pupils: Pupils are equal, round, and reactive to light.  Neck:     Thyroid: No  thyromegaly.     Trachea: No tracheal deviation.  Cardiovascular:     Rate and Rhythm: Normal rate.  Pulmonary:     Effort: Pulmonary effort is normal.     Breath sounds: No wheezing.  Abdominal:     General: Bowel sounds are normal.     Palpations: Abdomen is soft.  Skin:    General: Skin is warm and dry.     Capillary Refill: Capillary refill takes less than 2 seconds.  Neurological:     Mental Status: He is alert and oriented to person, place, and time.  Psychiatric:        Behavior: Behavior normal.        Thought Content: Thought content normal.        Judgment: Judgment normal.     Ortho Exam in sitting position patient can figure 4 with his left hip which is comfortable.  Limitation internal rotation only 15 degrees reproducing his anterior left groin pain.  Mild trochanteric bursal tenderness no sciatic notch tenderness.  No tenderness over the ischial tuberosities.  Opposite right hip shows 10 to 15 degrees internal rotation without pain.  He cannot figure 4 in sitting position distal pulses are intact.  Specialty Comments:  No specialty comments available.  Imaging: CLINICAL  DATA:  Chronic left hip pain for 3 years.  No known injury.  EXAM: MR OF THE LEFT HIP WITHOUT CONTRAST  TECHNIQUE: Multiplanar, multisequence MR imaging was performed. No intravenous contrast was administered.  COMPARISON:  X-ray 09/16/2019, CT 06/03/2019  FINDINGS: Bones: No acute fracture. No dislocation. No femoral head avascular necrosis. Degenerative subchondral marrow signal changes of the bilateral hips, right worse than left. No bone marrow edema. No marrow replacing lesion.  Articular cartilage and labrum  Articular cartilage: Diffuse chondral thinning with high-grade chondral loss at the anterior aspect of the acetabulum where there is associated subchondral marrow signal changes.  Labrum:  Superior labral degeneration.  No paralabral cyst.  Joint or bursal  effusion  Joint effusion:  None.  Bursae: No abnormal bursal fluid collection.  Muscles and tendons  Muscles and tendons: Mild tendinosis of the bilateral hamstring tendon origins. The gluteal, iliopsoas, rectus femoris, and adductor tendons appear intact without tear or significant tendinosis. Unremarkable muscle bulk and signal intensity.  Other findings  Miscellaneous:   Degenerative disc disease of L4-5.  IMPRESSION: 1. Mild-moderate osteoarthritis of the bilateral hips. 2. Mild bilateral hamstring tendinosis.   Electronically Signed   By: Duanne Guess D.O.   On: 01/30/2020 09:48   PMFS History: Patient Active Problem List   Diagnosis Date Noted  . Unilateral primary osteoarthritis, left hip 01/10/2020  . Acute renal failure (ARF) (HCC) 06/19/2018  . Uncontrolled diabetes mellitus type 2 without complications 06/19/2018  . Polysubstance abuse (HCC) 06/19/2018  . ARF (acute renal failure) (HCC) 09/21/2017  . Chest pain 09/21/2017  . Left sided numbness 09/21/2017  . Alcohol abuse with intoxication (HCC) 10/08/2014  . Cocaine abuse with intoxication and without complication (HCC) 10/08/2014  . Alcohol dependence with withdrawal, uncomplicated (HCC) 10/07/2014  . Cocaine abuse (HCC) 10/07/2014  . Substance induced mood disorder (HCC) 10/07/2014  . Cocaine use disorder, mild, abuse (HCC)   . Alcohol use disorder, moderate, dependence (HCC)   . Bipolar 1 disorder, depressed, severe (HCC) 06/10/2014  . Obesity (BMI 30.0-34.9) 04/11/2013  . Alcohol dependence (HCC) 11/06/2012  . Cocaine dependence (HCC) 11/06/2012  . Paranoid schizophrenia, chronic condition (HCC) 11/06/2012  . Alcohol abuse 11/05/2012  . S/P cardiac catheterization, 09/14/12 with mild non obstructive CAD and EF of 55% 09/15/2012  . HTN (hypertension), benign 09/12/2012  . DM type 2 (diabetes mellitus, type 2) (HCC) 09/12/2012  . BPH (benign prostatic hyperplasia) 09/12/2012  . Unstable  angina, cardiac cath without obstruction 09/12/2012  . Smoker 09/12/2012  . 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 Depression 09/12/2012  . Bipolar disorder (HCC) 09/12/2012  . Dyslipidemia- (LDL 190 in 2011) 09/12/2012   Past Medical History:  Diagnosis Date  . Acute renal failure (ARF) (HCC) 06/19/2018  . Alcohol dependence (HCC) 11/06/2012  . ARF (acute renal failure) (HCC) 09/20/2017  . Arthritis    "right knee, left hip, right elbow, between my shoulder blades, neck" (09/21/2017)  . Bipolar disorder (HCC)   . BPH (benign prostatic hyperplasia) 09/12/2012  . Chest pain 09/21/2017  . Chronic headache    "in the back of my neck; only in the winter time" (09/21/2017)  . Chronic neck pain   . Chronic shoulder pain    "between shoulder blades" (09/21/2017)  . Cocaine abuse (HCC) 10/07/2014  . Cocaine abuse with intoxication and without complication (HCC) 10/08/2014  . Cocaine dependence (HCC) 11/06/2012  . Cocaine use disorder, mild, abuse (HCC)   . Depression   .  DJD (degenerative joint disease) of cervical spine    on disability  . 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 DM type 2 (diabetes mellitus, type 2) (HCC) 09/12/2012  . Dyslipidemia- (LDL 190 in 2011) 09/12/2012  . High cholesterol   . History of stomach ulcers 1980s   "they healed"  . HTN (hypertension), benign 09/12/2012  . Hypertension   . Left sided numbness 09/21/2017  . Obesity (BMI 30.0-34.9) 04/11/2013  . Paranoid schizophrenia, chronic condition (HCC) 11/06/2012  . Pneumonia    "maybe twice since childhood" (09/21/2017)  . Polysubstance abuse (HCC) 06/19/2018  . S/P cardiac catheterization, 09/14/12 with mild non obstructive CAD and EF of 55% 09/15/2012  . Seizures (HCC)    "diabetic seizures when they first tried to get my RX right" (09/21/2017)  . Smoker 09/12/2012  . Substance induced mood disorder (HCC) 10/07/2014  . Type II diabetes mellitus (HCC)   . Uncontrolled diabetes mellitus type 2  without complications 06/19/2018  . Unstable angina, cardiac cath without obstruction 09/12/2012    Family History  Problem Relation Age of Onset  . Heart disease Brother        has had heart transplant  . Diabetes Mother   . Cancer Mother   . Hypertension Mother   . Cancer Brother   . Diabetes Brother   . Cancer Brother     Past Surgical History:  Procedure Laterality Date  . ANTERIOR CERVICAL DECOMP/DISCECTOMY FUSION  2006  . BACK SURGERY    . LEFT HEART CATHETERIZATION WITH CORONARY ANGIOGRAM N/A 09/14/2012   Procedure: LEFT HEART CATHETERIZATION WITH CORONARY ANGIOGRAM;  Surgeon: Thurmon Fair, MD;  Location: [facility];  Service: Cardiovascular;  Laterality: N/A;   Social History   Occupational History  . Occupation: unemployed/disabled  Tobacco Use  . Smoking status: Current Every Day Smoker    Packs/day: 0.12    Years: 35.00    Pack years: 4.20    Types: Cigarettes  . Smokeless tobacco: Never Used  Vaping Use  . Vaping Use: Never used  Substance and Sexual Activity  . Alcohol use: Yes    Alcohol/week: 39.0 standard drinks    Types: 2 Cans of beer, 37 Shots of liquor per week    Comment: "3 pints of vodka/wk"; last drink about 3 1/2 weeks ago  . Drug use: Yes    Types: Cocaine, Marijuana    Comment: occasional marijuana use; occasional cocaine use  . Sexual activity: Not Currently

## 2020-02-29 ENCOUNTER — Encounter: Payer: Self-pay | Admitting: Physical Medicine and Rehabilitation

## 2020-03-20 ENCOUNTER — Ambulatory Visit: Payer: Medicaid Other | Admitting: Orthopaedic Surgery

## 2020-04-10 ENCOUNTER — Ambulatory Visit: Payer: Medicaid Other | Admitting: Cardiology

## 2020-04-19 ENCOUNTER — Encounter
Payer: Medicaid Other | Attending: Physical Medicine and Rehabilitation | Admitting: Physical Medicine and Rehabilitation

## 2020-09-19 ENCOUNTER — Ambulatory Visit: Payer: Medicaid Other | Admitting: Orthopaedic Surgery

## 2020-09-21 ENCOUNTER — Telehealth: Payer: Self-pay

## 2020-09-21 NOTE — Telephone Encounter (Signed)
Patients son Ladene Artist called he would like to get his dad set up with home care after he receives his surgery he is requesting a call back:[phone]

## 2020-09-21 NOTE — Telephone Encounter (Signed)
Please advise 

## 2020-09-21 NOTE — Telephone Encounter (Signed)
Tried to call him. No answer. Patient currently was to come back 6 months from 10/21 for recheck about his hip. Not currently scheduled for surgery. Is his Dad limping more and having problems walking?

## 2020-09-21 NOTE — Telephone Encounter (Signed)
I called, voicemail does not state who phone belongs to. No message left. I will try again Monday morning.

## 2020-09-24 NOTE — Telephone Encounter (Signed)
I called, voicemail picked up again. Patient is scheduled for follow up to discuss surgery. Will discuss at that time. If Ladene Artist calls back and would like to speak with me prior to patient's appointment, I will be glad to call him back.

## 2020-10-03 ENCOUNTER — Ambulatory Visit (INDEPENDENT_AMBULATORY_CARE_PROVIDER_SITE_OTHER): Payer: Medicaid Other

## 2020-10-03 ENCOUNTER — Encounter: Payer: Self-pay | Admitting: Orthopaedic Surgery

## 2020-10-03 ENCOUNTER — Ambulatory Visit (INDEPENDENT_AMBULATORY_CARE_PROVIDER_SITE_OTHER): Payer: Medicaid Other | Admitting: Orthopaedic Surgery

## 2020-10-03 VITALS — BP 129/75 | HR 90 | Ht 71.5 in | Wt 187.0 lb

## 2020-10-03 DIAGNOSIS — G8929 Other chronic pain: Secondary | ICD-10-CM

## 2020-10-03 DIAGNOSIS — M545 Low back pain, unspecified: Secondary | ICD-10-CM | POA: Diagnosis not present

## 2020-10-03 DIAGNOSIS — M25552 Pain in left hip: Secondary | ICD-10-CM

## 2020-10-03 NOTE — Progress Notes (Signed)
Office Visit Note   Patient: Eduardo French           Date of Birth: July 30, 1960           MRN: [id number] Visit Date: 10/03/2020              Requested by: Medicine, [facility] [address] [place],  [place] [address] PCP: [facility], [facility]   Assessment & Plan: Visit Diagnoses:  1. Pain in left hip   2. Chronic left-sided low back pain, unspecified whether sciatica present     Plan: Patient is requesting some physical therapy for his back pain and hip pain.  He has some tendinosis of the hamstrings and mild to moderate osteoarthritis of the hips slightly worse left than right.  He is on Aleve 2 p.o. twice daily.  We discussed importance of diabetic control and risk of renal problems with anti-inflammatories.  Physical therapy ordered.  Recheck 2 months.  Follow-Up Instructions: No follow-ups on file.   Orders:  Orders Placed This Encounter  Procedures  . XR Lumbar Spine 2-3 Views  . XR HIP UNILAT W OR W/O PELVIS 2-3 VIEWS LEFT   No orders of the defined types were placed in this encounter.     Procedures: No procedures performed   Clinical Data: No additional findings.   Subjective: Chief Complaint  Patient presents with  . Left Hip - Pain    HPI 60 year old male returns with ongoing problems with hip pain is amatory with a cane in the right hand.  Patient's not on any narcotic medication.  Past history of polysubstance abuse, renal failure, alcohol abuse.  Bipolar disorder.  MRI scan has shown some mild to moderate arthritis of the hips with some hamstring tendinopathy.  He states at times pain is severe he has to sit for 45 minutes for he can get up and walk.  He states he gets relief if he sits in figure 4 which is left lower extremity.  Review of Systems Negative for fever chills.  Positive for diabetes poor control.  History of renal failure substance abuse.  All other systems noncontributory to HPI.  Objective: Vital  Signs: BP 129/75   Pulse 90   Ht 5' 11.5" (1.816 m)   Wt 187 lb (84.8 kg)   BMI 25.72 kg/m   Physical Exam Constitutional:      Appearance: He is well-developed.  HENT:     Head: Normocephalic and atraumatic.  Eyes:     Pupils: Pupils are equal, round, and reactive to light.  Neck:     Thyroid: No thyromegaly.     Trachea: No tracheal deviation.  Cardiovascular:     Rate and Rhythm: Normal rate.  Pulmonary:     Effort: Pulmonary effort is normal.     Breath sounds: No wheezing.  Abdominal:     General: Bowel sounds are normal.     Palpations: Abdomen is soft.  Skin:    General: Skin is warm and dry.     Capillary Refill: Capillary refill takes less than 2 seconds.  Neurological:     Mental Status: He is alert and oriented to person, place, and time.  Psychiatric:        Behavior: Behavior normal.        Thought Content: Thought content normal.        Judgment: Judgment normal.     Ortho Exam 30 degrees internal rotation left hip.  He is more  comfortable in the figure-of-four position sitting.  Reflexes are intact and extremity raising 90 degrees.  Some tenderness over the ischial tuberosity hamstring origin.  He is amatory with trace left hip limp.  Specialty Comments:  No specialty comments available.  Imaging: No results found.   PMFS History: Patient Active Problem List   Diagnosis Date Noted  . Unilateral primary osteoarthritis, left hip 01/10/2020  . Acute renal failure (ARF) (HCC) 06/19/2018  . Uncontrolled diabetes mellitus type 2 without complications 06/19/2018  . Polysubstance abuse (HCC) 06/19/2018  . ARF (acute renal failure) (HCC) 09/21/2017  . Chest pain 09/21/2017  . Left sided numbness 09/21/2017  . Alcohol abuse with intoxication (HCC) 10/08/2014  . Cocaine abuse with intoxication and without complication (HCC) 10/08/2014  . Alcohol dependence with withdrawal, uncomplicated (HCC) 10/07/2014  . Cocaine abuse (HCC) 10/07/2014  . Substance  induced mood disorder (HCC) 10/07/2014  . Cocaine use disorder, mild, abuse (HCC)   . Alcohol use disorder, moderate, dependence (HCC)   . Bipolar 1 disorder, depressed, severe (HCC) 06/10/2014  . Obesity (BMI 30.0-34.9) 04/11/2013  . Alcohol dependence (HCC) 11/06/2012  . Cocaine dependence (HCC) 11/06/2012  . Paranoid schizophrenia, chronic condition (HCC) 11/06/2012  . Alcohol abuse 11/05/2012  . S/P cardiac catheterization, 09/14/12 with mild non obstructive CAD and EF of 55% 09/15/2012  . HTN (hypertension), benign 09/12/2012  . DM type 2 (diabetes mellitus, type 2) (HCC) 09/12/2012  . BPH (benign prostatic hyperplasia) 09/12/2012  . Unstable angina, cardiac cath without obstruction 09/12/2012  . Smoker 09/12/2012  . 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 Depression 09/12/2012  . Bipolar disorder (HCC) 09/12/2012  . Dyslipidemia- (LDL 190 in 2011) 09/12/2012   Past Medical History:  Diagnosis Date  . Acute renal failure (ARF) (HCC) 06/19/2018  . Alcohol dependence (HCC) 11/06/2012  . ARF (acute renal failure) (HCC) 09/20/2017  . Arthritis    "right knee, left hip, right elbow, between my shoulder blades, neck" (09/21/2017)  . Bipolar disorder (HCC)   . BPH (benign prostatic hyperplasia) 09/12/2012  . Chest pain 09/21/2017  . Chronic headache    "in the back of my neck; only in the winter time" (09/21/2017)  . Chronic neck pain   . Chronic shoulder pain    "between shoulder blades" (09/21/2017)  . Cocaine abuse (HCC) 10/07/2014  . Cocaine abuse with intoxication and without complication (HCC) 10/08/2014  . Cocaine dependence (HCC) 11/06/2012  . Cocaine use disorder, mild, abuse (HCC)   . Depression   . DJD (degenerative joint disease) of cervical spine    on disability  . 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 DM type 2 (diabetes mellitus, type 2) (HCC) 09/12/2012  . Dyslipidemia- (LDL 190 in 2011) 09/12/2012  . High cholesterol   . History of stomach  ulcers 1980s   "they healed"  . HTN (hypertension), benign 09/12/2012  . Hypertension   . Left sided numbness 09/21/2017  . Obesity (BMI 30.0-34.9) 04/11/2013  . Paranoid schizophrenia, chronic condition (HCC) 11/06/2012  . Pneumonia    "maybe twice since childhood" (09/21/2017)  . Polysubstance abuse (HCC) 06/19/2018  . S/P cardiac catheterization, 09/14/12 with mild non obstructive CAD and EF of 55% 09/15/2012  . Seizures (HCC)    "diabetic seizures when they first tried to get my RX right" (09/21/2017)  . Smoker 09/12/2012  . Substance induced mood disorder (HCC) 10/07/2014  . Type II diabetes mellitus (HCC)   . Uncontrolled diabetes mellitus type 2  without complications 06/19/2018  . Unstable angina, cardiac cath without obstruction 09/12/2012    Family History  Problem Relation Age of Onset  . Heart disease Brother        has had heart transplant  . Diabetes Mother   . Cancer Mother   . Hypertension Mother   . Cancer Brother   . Diabetes Brother   . Cancer Brother     Past Surgical History:  Procedure Laterality Date  . ANTERIOR CERVICAL DECOMP/DISCECTOMY FUSION  2006  . BACK SURGERY    . LEFT HEART CATHETERIZATION WITH CORONARY ANGIOGRAM N/A 09/14/2012   Procedure: LEFT HEART CATHETERIZATION WITH CORONARY ANGIOGRAM;  Surgeon: Thurmon Fair, MD;  Location: [facility];  Service: Cardiovascular;  Laterality: N/A;   Social History   Occupational History  . Occupation: unemployed/disabled  Tobacco Use  . Smoking status: Current Every Day Smoker    Packs/day: 0.12    Years: 35.00    Pack years: 4.20    Types: Cigarettes  . Smokeless tobacco: Never Used  Vaping Use  . Vaping Use: Never used  Substance and Sexual Activity  . Alcohol use: Yes    Alcohol/week: 39.0 standard drinks    Types: 2 Cans of beer, 37 Shots of liquor per week    Comment: "3 pints of vodka/wk"; last drink about 3 1/2 weeks ago  . Drug use: Yes    Types: Cocaine, Marijuana    Comment: occasional marijuana  use; occasional cocaine use  . Sexual activity: Not Currently

## 2020-10-15 ENCOUNTER — Telehealth: Payer: Self-pay | Admitting: Orthopaedic Surgery

## 2020-10-15 DIAGNOSIS — M545 Low back pain, unspecified: Secondary | ICD-10-CM

## 2020-10-15 DIAGNOSIS — G8929 Other chronic pain: Secondary | ICD-10-CM

## 2020-10-15 DIAGNOSIS — M25552 Pain in left hip: Secondary | ICD-10-CM

## 2020-10-15 NOTE — Telephone Encounter (Signed)
Referral entered. I called patient and advised. 

## 2020-10-15 NOTE — Telephone Encounter (Signed)
Pt called stating he was supposed to get set up for PT but he never received any calls. Pt is wanting to make sure doing PT for 2 months and then following up with Dr. Ophelia Charter is still the plan. Pt would like a CB to discuss further.  [phone]

## 2020-10-15 NOTE — Telephone Encounter (Signed)
PT is mentioned in your note, but referral not entered. Is this just for PT lumbar and hip, eval and treat?

## 2020-10-16 ENCOUNTER — Telehealth: Payer: Self-pay | Admitting: Orthopaedic Surgery

## 2020-10-16 NOTE — Telephone Encounter (Signed)
[facility] called and states they faxed over a form for range of motion for the pt?   They're going to refax it over for the second time in attention to Dr.Yates   CB [phone]

## 2020-10-16 NOTE — Telephone Encounter (Signed)
Could you please let me know if you receive this? Thanks.

## 2020-10-18 NOTE — Telephone Encounter (Signed)
Did you ever receive anything on patient?

## 2020-10-19 NOTE — Telephone Encounter (Signed)
I called. Company is [facility]. They have a nurse who is helping out Eduardo French for 3-4 hours a day who helps with stretches, etc. Form asks patient's ROM, affected areas so that they do not provide care that could be harmful to patient. They will refax form. I gave fax number of [phone].  Tammy-FYI.  [facility] is supposed to fax again. Thanks.

## 2020-10-22 NOTE — Telephone Encounter (Signed)
FYI. Nothing yet received.

## 2020-10-24 NOTE — Telephone Encounter (Signed)
Form to be refaxed. Patient has now been referred to outpatient PT and has appt 10/29/2020.

## 2020-10-29 ENCOUNTER — Ambulatory Visit: Payer: Medicaid Other | Attending: Orthopaedic Surgery

## 2020-10-29 ENCOUNTER — Other Ambulatory Visit: Payer: Self-pay

## 2020-10-29 DIAGNOSIS — M6281 Muscle weakness (generalized): Secondary | ICD-10-CM | POA: Diagnosis present

## 2020-10-29 DIAGNOSIS — G8929 Other chronic pain: Secondary | ICD-10-CM | POA: Diagnosis present

## 2020-10-29 DIAGNOSIS — M545 Low back pain, unspecified: Secondary | ICD-10-CM | POA: Diagnosis present

## 2020-10-29 DIAGNOSIS — M25552 Pain in left hip: Secondary | ICD-10-CM | POA: Insufficient documentation

## 2020-10-29 NOTE — Therapy (Signed)
[facility] [address] [place], [place], [address] Phone: [phone]   Fax:  [phone]  Physical Therapy Evaluation  Patient Details  Name: Eduardo French MRN: [id number] Date of Birth: [birth year] Referring Provider (PT): Eldred Manges, MD   Encounter Date: 10/29/2020   PT End of Session - 10/29/20 1227     Visit Number 1    Number of Visits 16    Date for PT Re-Evaluation 01/07/21    Authorization Type MCD [place] - No FOTO    PT Start Time 0915    PT Stop Time 1001    PT Time Calculation (min) 46 min    Activity Tolerance Patient tolerated treatment well    Behavior During Therapy [facility] for tasks assessed/performed             Past Medical History:  Diagnosis Date   Acute renal failure (ARF) (HCC) 06/19/2018   Alcohol dependence (HCC) 11/06/2012   ARF (acute renal failure) (HCC) 09/20/2017   Arthritis    "right knee, left hip, right elbow, between my shoulder blades, neck" (09/21/2017)   Bipolar disorder (HCC)    BPH (benign prostatic hyperplasia) 09/12/2012   Chest pain 09/21/2017   Chronic headache    "in the back of my neck; only in the winter time" (09/21/2017)   Chronic neck pain    Chronic shoulder pain    "between shoulder blades" (09/21/2017)   Cocaine abuse (HCC) 10/07/2014   Cocaine abuse with intoxication and without complication (HCC) 10/08/2014   Cocaine dependence (HCC) 11/06/2012   Cocaine use disorder, mild, abuse (HCC)    Depression    DJD (degenerative joint disease) of cervical spine    on disability   DJD - on disability, s/p C-spine surgery 09/12/2012   DM type 2 (diabetes mellitus, type 2) (HCC) 09/12/2012   Dyslipidemia- (LDL 190 in 2011) 09/12/2012   High cholesterol    History of stomach ulcers 1980s   "they healed"   HTN (hypertension), benign 09/12/2012   Hypertension    Left sided numbness 09/21/2017   Obesity (BMI 30.0-34.9) 04/11/2013   Paranoid schizophrenia, chronic condition (HCC)  11/06/2012   Pneumonia    "maybe twice since childhood" (09/21/2017)   Polysubstance abuse (HCC) 06/19/2018   S/P cardiac catheterization, 09/14/12 with mild non obstructive CAD and EF of 55% 09/15/2012   Seizures (HCC)    "diabetic seizures when they first tried to get my RX right" (09/21/2017)   Smoker 09/12/2012   Substance induced mood disorder (HCC) 10/07/2014   Type II diabetes mellitus (HCC)    Uncontrolled diabetes mellitus type 2 without complications 06/19/2018   Unstable angina, cardiac cath without obstruction 09/12/2012    Past Surgical History:  Procedure Laterality Date   ANTERIOR CERVICAL DECOMP/DISCECTOMY FUSION  2006   BACK SURGERY     LEFT HEART CATHETERIZATION WITH CORONARY ANGIOGRAM N/A 09/14/2012   Procedure: LEFT HEART CATHETERIZATION WITH CORONARY ANGIOGRAM;  Surgeon: Thurmon Fair, MD;  Location: [facility] CATH [facility];  Service: Cardiovascular;  Laterality: N/A;    There were no vitals filed for this visit.    Subjective Assessment - 10/29/20 0919     Subjective Pt presents to PT with reports of chronic L sided lower back and L hip pain. 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 Pt reports that this is where his lower back pain started and has been  getting worse since. He also reports some pain and paresthesias down his posterior L LE. Pt also notes pain near his L testicular area that moves towards the L hip. Pt denies bowel/bladder changes or saddle anesthesias. 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    Limitations Standing;Lifting;Walking;Sitting    How long can you sit comfortably? 10-15 minutes    How long can you stand comfortably? 30 minutes    How long can you walk comfortably? 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 Yes    Pain Score 4     Pain Location Back    Pain Orientation Left;Posterior    Pain Descriptors / Indicators Sharp    Pain Type Chronic pain    Pain Radiating Towards L hip    Pain Onset More than a month ago    Pain Frequency Constant    Aggravating Factors  prolonged standing, prolonged sitting 8/10 at worst    Pain Relieving Factors heat, medication                [facility] PT Assessment - 10/29/20 0001       Assessment   Medical Diagnosis M25.552 (ICD-10-CM) - Pain in left hip  M54.50,G89.29 (ICD-10-CM) - Chronic left-sided low back pain, unspecified whether sciatica present    Referring Provider (PT) Eldred MangesYates, Mark C, MD      Precautions   Precautions None      Restrictions   Weight Bearing Restrictions No      Balance Screen   Has the patient fallen in the past 6 months Yes    How many times? 4 times    Has the patient had a decrease in activity level because of a fear of falling?  No    Is the patient reluctant to leave their home because of a fear of falling?  No      Home Environment   Living Environment Private residence    Living Arrangements Alone    Type of Home Apartment    Home Access Level entry    Home Layout One level    Home Equipment [place] - single point      Prior Function   Level of Independence Independent;Independent with basic ADLs      Cognition   Overall Cognitive Status Within Functional Limits for tasks assessed      Observation/Other Assessments   Focus on Therapeutic Outcomes (FOTO)  No FOTO - MCD      Strength   Right Hip Flexion 4+/5    Right Hip ABduction 4+/5    Right Hip ADduction 4+/5    Left Hip Flexion 3/5    Left Hip ABduction 3+/5    Left Hip ADduction 3+/5    Right Knee Flexion 5/5    Right Knee Extension 5/5    Left Knee Flexion 4/5    Left Knee Extension 4/5    Right Ankle Dorsiflexion 5/5    Right Ankle Plantar Flexion 5/5    Left Ankle Dorsiflexion 5/5    Left Ankle Plantar Flexion 5/5      Special  Tests    Special Tests Lumbar    Lumbar Tests Straight Leg Raise;Slump Test      Slump test   Findings Positive    Side  Left      Straight Leg Raise   Findings Positive    Side  Left      Transfers   Five time sit to stand comments  35 seconds                        Objective measurements completed on examination: See above findings.       [facility] Adult PT Treatment/Exercise - 10/29/20 0001       Exercises   Exercises Lumbar      Lumbar Exercises: Stretches   Piriformis Stretch Left;30 seconds    Figure 4 Stretch Limitations 30 sec Left      Lumbar Exercises: Seated   Other Seated Lumbar Exercises sciatic nerve glide x 10 L LE      Lumbar Exercises: Supine   Bridge 10 reps                    PT Education - 10/29/20 1237     Education Details eval findings, HEP, POC    Person(s) Educated Patient    Methods Explanation;Demonstration;Handout    Comprehension Verbalized understanding;Returned demonstration              PT Short Term Goals - 10/29/20 1228       PT SHORT TERM GOAL #1   Title Pt will be compliant and knowledgeable with 90% of HEP in order to improve carryover and strength    Baseline initial HEP given    Time 3    Period Weeks    Status New    Target Date 11/19/20      PT SHORT TERM GOAL #2   Title Pt will self report LBP no greater than 6/10 at worst in order to improve comfort and functional ability    Baseline 8/10 at worst    Time 3    Period Weeks    Status New    Target Date 11/19/20               PT Long Term Goals - 10/29/20 1229       PT LONG TERM GOAL #1   Title Pt will decrease 5xSTS time to no greater than 18 secs in order to improve functional mobility    Baseline 35 seconds    Time 10    Period Weeks    Status New    Target Date 01/07/21      PT LONG TERM GOAL #2   Title Pt will self report LBP no greater than 3/10 at worst in order to improve comfort and functional ability     Baseline 8/10 at worst    Time 10    Period Weeks    Status New    Target Date 01/07/21      PT LONG TERM GOAL #3   Title Pt will be able to squat and lift 40lb box with no increase in L hip or LBP in order to improve functional ability and future participation with work    Baseline unable    Time 10    Period Weeks    Status New    Target Date 01/07/21                    Plan - 10/29/20 1237     Clinical Impression Statement Pt presents to PT with reports of chronic L sided lower back and L hip pain. Physical findings are consistent with MD  impression, as pt demonstrates positive slump and SLR tests on L LE, with inability to rule out lumbar pathology and contribution. He also displays proximal hip weakness as noted by MMT and 5xSTS, with the latter also placing hin at an increased risk for falls. Due to these signs and symptoms, pt is operating below PLOF and would benefit from skilled PT services working on improving strength and mobility in order to decrease pain and improve function.    Personal Factors and Comorbidities Comorbidity 3+;Past/Current Experience;Fitness    Comorbidities HTN; DMII; cervical fusion    Examination-Activity Limitations Squat;Stairs;Stand;Lift;Carry;Locomotion Level    Examination-Participation Restrictions Yard Work;Volunteer;Community Activity;Cleaning;Occupation    PT Frequency 1x / week    PT Duration 3 weeks    PT Treatment/Interventions ADLs/Self Care Home Management;Electrical Stimulation;Cryotherapy;Moist Heat;Gait training;Stair training;Functional mobility training;Therapeutic activities;Therapeutic exercise;Balance training;Neuromuscular re-education;Patient/family education;Manual techniques;Dry needling;Taping;Vasopneumatic Device    PT Next Visit Plan perform ODI with LTG; assess HEP response; progress exercises as able    PT Home Exercise Plan Access Code: [email or web address]    Consulted and Agree with Plan of Care Patient              Patient will benefit from skilled therapeutic intervention in order to improve the following deficits and impairments:  Abnormal gait, Decreased activity tolerance, Decreased balance, Decreased endurance, Decreased mobility, Decreased range of motion, Decreased strength, Difficulty walking, Impaired sensation, Pain  Visit Diagnosis: Chronic left-sided low back pain, unspecified whether sciatica present - Plan: PT plan of care cert/re-cert  Muscle weakness (generalized) - Plan: PT plan of care cert/re-cert  Pain in left hip - Plan: PT plan of care cert/re-cert     Problem List Patient Active Problem List   Diagnosis Date Noted   Unilateral primary osteoarthritis, left hip 01/10/2020   Acute renal failure (ARF) (HCC) 06/19/2018   Uncontrolled diabetes mellitus type 2 without complications 06/19/2018   Polysubstance abuse (HCC) 06/19/2018   ARF (acute renal failure) (HCC) 09/21/2017   Chest pain 09/21/2017   Left sided numbness 09/21/2017   Alcohol abuse with intoxication (HCC) 10/08/2014   Cocaine abuse with intoxication and without complication (HCC) 10/08/2014   Alcohol dependence with withdrawal, uncomplicated (HCC) 10/07/2014   Cocaine abuse (HCC) 10/07/2014   Substance induced mood disorder (HCC) 10/07/2014   Cocaine use disorder, mild, abuse (HCC)    Alcohol use disorder, moderate, dependence (HCC)    Bipolar 1 disorder, depressed, severe (HCC) 06/10/2014   Obesity (BMI 30.0-34.9) 04/11/2013   Alcohol dependence (HCC) 11/06/2012   Cocaine dependence (HCC) 11/06/2012   Paranoid schizophrenia, chronic condition (HCC) 11/06/2012   Alcohol abuse 11/05/2012   S/P cardiac catheterization, 09/14/12 with mild non obstructive CAD and EF of 55% 09/15/2012   HTN (hypertension), benign 09/12/2012   DM type 2 (diabetes mellitus, type 2) (HCC) 09/12/2012   BPH (benign prostatic hyperplasia) 09/12/2012   Unstable angina, cardiac cath without obstruction 09/12/2012   Smoker  09/12/2012   DJD - on disability, s/p C-spine surgery 09/12/2012   Depression 09/12/2012   Bipolar disorder (HCC) 09/12/2012   Dyslipidemia- (LDL 190 in 2011) 09/12/2012    Eloy End, PT, DPT 10/29/20 12:45 PM  [facility] [address] [place], [place], [address] Phone: [phone]   Fax:  [phone]  Name: Eduardo French MRN: [id number] Date of Birth: 06/03/1960

## 2020-11-07 ENCOUNTER — Ambulatory Visit: Payer: Medicaid Other | Admitting: Physical Therapy

## 2020-11-14 ENCOUNTER — Other Ambulatory Visit: Payer: Self-pay

## 2020-11-14 ENCOUNTER — Ambulatory Visit: Payer: Medicaid Other | Attending: Orthopaedic Surgery

## 2020-11-14 DIAGNOSIS — M25552 Pain in left hip: Secondary | ICD-10-CM | POA: Insufficient documentation

## 2020-11-14 DIAGNOSIS — M6281 Muscle weakness (generalized): Secondary | ICD-10-CM | POA: Insufficient documentation

## 2020-11-14 DIAGNOSIS — G8929 Other chronic pain: Secondary | ICD-10-CM | POA: Diagnosis present

## 2020-11-14 DIAGNOSIS — M545 Low back pain, unspecified: Secondary | ICD-10-CM | POA: Insufficient documentation

## 2020-11-14 NOTE — Therapy (Signed)
[facility] [place], [place], [address] Phone: [phone]   Fax:  [phone]  Physical Therapy Treatment  Patient Details  Name: Eduardo French MRN: [id number] Date of Birth: 1960-11-26 Referring Provider (PT): Marybelle Killings, MD   Encounter Date: 11/14/2020   PT End of Session - 11/14/20 1046     Visit Number 2    Number of Visits 16    Date for PT Re-Evaluation 01/07/21    Authorization Type MCD [place] - No FOTO    PT Start Time 1044    PT Stop Time 1125    PT Time Calculation (min) 41 min    Activity Tolerance Patient tolerated treatment well    Behavior During Therapy [facility] for tasks assessed/performed             Past Medical History:  Diagnosis Date   Acute renal failure (ARF) ([place]) 06/19/2018   Alcohol dependence ([place]) 11/06/2012   ARF (acute renal failure) ([place]) 09/20/2017   Arthritis    "right knee, left hip, right elbow, between my shoulder blades, neck" (09/21/2017)   Bipolar disorder ([place])    BPH (benign prostatic hyperplasia) 09/12/2012   Chest pain 09/21/2017   Chronic headache    "in the back of my neck; only in the winter time" (09/21/2017)   Chronic neck pain    Chronic shoulder pain    "between shoulder blades" (09/21/2017)   Cocaine abuse ([place]) 10/07/2014   Cocaine abuse with intoxication and without complication ([place]) 0/11/1217   Cocaine dependence ([place]) 11/06/2012   Cocaine use disorder, mild, abuse (HCC)    Depression    DJD (degenerative joint disease) of cervical spine    on disability   DJD - on disability, s/p C-spine surgery 09/12/2012   DM type 2 (diabetes mellitus, type 2) ([place]) 09/12/2012   Dyslipidemia- (LDL 190 in 2011) 09/12/2012   High cholesterol    History of stomach ulcers 1980s   "they healed"   HTN (hypertension), benign 09/12/2012   Hypertension    Left sided numbness 09/21/2017   Obesity (BMI 30.0-34.9) 04/11/2013   Paranoid schizophrenia, chronic condition ([place])  11/06/2012   Pneumonia    "maybe twice since childhood" (09/21/2017)   Polysubstance abuse ([place]) 06/19/2018   S/P cardiac catheterization, 09/14/12 with mild non obstructive CAD and EF of 55% 09/15/2012   Seizures ([place])    "diabetic seizures when they first tried to get my RX right" (09/21/2017)   Smoker 09/12/2012   Substance induced mood disorder ([place]) 10/07/2014   Type II diabetes mellitus ([place])    Uncontrolled diabetes mellitus type 2 without complications 7/58/8325   Unstable angina, cardiac cath without obstruction 09/12/2012    Past Surgical History:  Procedure Laterality Date   ANTERIOR CERVICAL DECOMP/DISCECTOMY FUSION  2006   BACK SURGERY     LEFT HEART CATHETERIZATION WITH CORONARY ANGIOGRAM N/A 09/14/2012   Procedure: LEFT HEART CATHETERIZATION WITH CORONARY ANGIOGRAM;  Surgeon: Sanda Klein, MD;  Location: [facility];  Service: Cardiovascular;  Laterality: N/A;    There were no vitals filed for this visit.   Subjective Assessment - 11/14/20 1046     Subjective Pt presents to PT with reports of continued L sided lower back and L hip pain. Notes he has been compliant with HEP "on his good days". Pt is ready to begin PT at this time.    Currently in Pain? Yes    Pain Score 5     Pain Location  Back    Pain Orientation Left;Posterior                               [facility] Adult PT Treatment/Exercise - 11/14/20 0001       Lumbar Exercises: Stretches   Other Lumbar Stretch Exercise modified thomas stretch 2x30 sec L    Other Lumbar Stretch Exercise fwd ball rollouts x 10 - 5 sec hold green physioball      Lumbar Exercises: Supine   Pelvic Tilt 10 reps;5 seconds    Bridge 10 reps    Other Supine Lumbar Exercises abd/add MET x 10 - 5 sec hold                    PT Education - 11/14/20 1133     Education Details HEP update    Person(s) Educated Patient    Methods Explanation;Demonstration;Handout    Comprehension Returned demonstration               PT Short Term Goals - 10/29/20 1228       PT SHORT TERM GOAL #1   Title Pt will be compliant and knowledgeable with 90% of HEP in order to improve carryover and strength    Baseline initial HEP given    Time 3    Period Weeks    Status New    Target Date 11/19/20      PT SHORT TERM GOAL #2   Title Pt will self report LBP no greater than 6/10 at worst in order to improve comfort and functional ability    Baseline 8/10 at worst    Time 3    Period Weeks    Status New    Target Date 11/19/20               PT Long Term Goals - 10/29/20 1229       PT LONG TERM GOAL #1   Title Pt will decrease 5xSTS time to no greater than 18 secs in order to improve functional mobility    Baseline 35 seconds    Time 10    Period Weeks    Status New    Target Date 01/07/21      PT LONG TERM GOAL #2   Title Pt will self report LBP no greater than 3/10 at worst in order to improve comfort and functional ability    Baseline 8/10 at worst    Time 10    Period Weeks    Status New    Target Date 01/07/21      PT LONG TERM GOAL #3   Title Pt will be able to squat and lift 40lb box with no increase in L hip or LBP in order to improve functional ability and future participation with work    Baseline unable    Time 10    Period Weeks    Status New    Target Date 01/07/21                   Plan - 11/14/20 1134     Clinical Impression Statement Pt was able to complete prescribed exercises with no adverse effect or increase in pain. Demonstrated preference for L hip flexor stretching today, with slight allevation of symptoms in L posterior back/hip noted. Modified hip flexor stretch was added to HEP. Will continue to progress as tolerated per POC as prescribed.    PT Treatment/Interventions  ADLs/Self Care Home Management;Electrical Stimulation;Cryotherapy;Moist Heat;Gait training;Stair training;Functional mobility training;Therapeutic activities;Therapeutic  exercise;Balance training;Neuromuscular re-education;Patient/family education;Manual techniques;Dry needling;Taping;Vasopneumatic Device    PT Next Visit Plan perform ODI with LTG; assess HEP response; progress exercises as able    PT Home Exercise Plan Access Code: [email or web address]             Patient will benefit from skilled therapeutic intervention in order to improve the following deficits and impairments:  Abnormal gait, Decreased activity tolerance, Decreased balance, Decreased endurance, Decreased mobility, Decreased range of motion, Decreased strength, Difficulty walking, Impaired sensation, Pain  Visit Diagnosis: Chronic left-sided low back pain, unspecified whether sciatica present  Muscle weakness (generalized)  Pain in left hip     Problem List Patient Active Problem List   Diagnosis Date Noted   Unilateral primary osteoarthritis, left hip 01/10/2020   Acute renal failure (ARF) ([place]) 06/19/2018   Uncontrolled diabetes mellitus type 2 without complications 01/06/148   Polysubstance abuse ([place]) 06/19/2018   ARF (acute renal failure) ([place]) 09/21/2017   Chest pain 09/21/2017   Left sided numbness 09/21/2017   Alcohol abuse with intoxication ([place]) 10/08/2014   Cocaine abuse with intoxication and without complication ([place]) 96/92/4932   Alcohol dependence with withdrawal, uncomplicated ([place]) 41/99/1444   Cocaine abuse ([place]) 10/07/2014   Substance induced mood disorder ([place]) 10/07/2014   Cocaine use disorder, mild, abuse ([place])    Alcohol use disorder, moderate, dependence ([place])    Bipolar 1 disorder, depressed, severe ([place]) 06/10/2014   Obesity (BMI 30.0-34.9) 04/11/2013   Alcohol dependence ([place]) 11/06/2012   Cocaine dependence ([place]) 11/06/2012   Paranoid schizophrenia, chronic condition ([place]) 11/06/2012   Alcohol abuse 11/05/2012   S/P cardiac catheterization, 09/14/12 with mild non obstructive CAD and EF of 55% 09/15/2012   HTN (hypertension), benign 09/12/2012   DM  type 2 (diabetes mellitus, type 2) ([place]) 09/12/2012   BPH (benign prostatic hyperplasia) 09/12/2012   Unstable angina, cardiac cath without obstruction 09/12/2012   Smoker 09/12/2012   DJD - on disability, s/p C-spine surgery 09/12/2012   Depression 09/12/2012   Bipolar disorder ([place]) 09/12/2012   Dyslipidemia- (LDL 190 in 2011) 09/12/2012    Ward Chatters, PT, DPT 11/14/20 11:36 AM  [facility] [address] [place], [place], [address] Phone: [phone]   Fax:  [phone]  Name: Eduardo French MRN: [id number] Date of Birth: October 29, 1960

## 2020-11-21 ENCOUNTER — Other Ambulatory Visit: Payer: Self-pay

## 2020-11-21 ENCOUNTER — Ambulatory Visit: Payer: Medicaid Other

## 2020-11-21 DIAGNOSIS — M6281 Muscle weakness (generalized): Secondary | ICD-10-CM

## 2020-11-21 DIAGNOSIS — M545 Low back pain, unspecified: Secondary | ICD-10-CM | POA: Diagnosis not present

## 2020-11-21 DIAGNOSIS — G8929 Other chronic pain: Secondary | ICD-10-CM

## 2020-11-21 DIAGNOSIS — M25552 Pain in left hip: Secondary | ICD-10-CM

## 2020-11-21 NOTE — Therapy (Addendum)
[facility] [address] [place], [place], [address] Phone: [phone]   Fax:  [phone]  Physical Therapy Treatment/Discharge  Patient Details  Name: Eduardo French MRN: [id number] Date of Birth: 1960/12/10 Referring Provider (PT): Eldred Manges, MD   Encounter Date: 11/21/2020   PT End of Session - 11/21/20 1041     Visit Number 3    Number of Visits 16    Date for PT Re-Evaluation 01/07/21    Authorization Type [facility] [place] - CCME    Authorization Time Period CCME approved 3 initial PT visits from 11/07/2020 - 11/27/2020    Authorization - Visit Number 2    Authorization - Number of Visits 3    PT Start Time 1045    PT Stop Time 1130    PT Time Calculation (min) 45 min    Activity Tolerance Patient tolerated treatment well    Behavior During Therapy [facility] for tasks assessed/performed             Past Medical History:  Diagnosis Date   Acute renal failure (ARF) (HCC) 06/19/2018   Alcohol dependence (HCC) 11/06/2012   ARF (acute renal failure) (HCC) 09/20/2017   Arthritis    "right knee, left hip, right elbow, between my shoulder blades, neck" (09/21/2017)   Bipolar disorder (HCC)    BPH (benign prostatic hyperplasia) 09/12/2012   Chest pain 09/21/2017   Chronic headache    "in the back of my neck; only in the winter time" (09/21/2017)   Chronic neck pain    Chronic shoulder pain    "between shoulder blades" (09/21/2017)   Cocaine abuse (HCC) 10/07/2014   Cocaine abuse with intoxication and without complication (HCC) 10/08/2014   Cocaine dependence (HCC) 11/06/2012   Cocaine use disorder, mild, abuse (HCC)    Depression    DJD (degenerative joint disease) of cervical spine    on disability   DJD - on disability, s/p C-spine surgery 09/12/2012   DM type 2 (diabetes mellitus, type 2) (HCC) 09/12/2012   Dyslipidemia- (LDL 190 in 2011) 09/12/2012   High cholesterol    History of stomach ulcers 1980s   "they healed"   HTN  (hypertension), benign 09/12/2012   Hypertension    Left sided numbness 09/21/2017   Obesity (BMI 30.0-34.9) 04/11/2013   Paranoid schizophrenia, chronic condition (HCC) 11/06/2012   Pneumonia    "maybe twice since childhood" (09/21/2017)   Polysubstance abuse (HCC) 06/19/2018   S/P cardiac catheterization, 09/14/12 with mild non obstructive CAD and EF of 55% 09/15/2012   Seizures (HCC)    "diabetic seizures when they first tried to get my RX right" (09/21/2017)   Smoker 09/12/2012   Substance induced mood disorder (HCC) 10/07/2014   Type II diabetes mellitus (HCC)    Uncontrolled diabetes mellitus type 2 without complications 06/19/2018   Unstable angina, cardiac cath without obstruction 09/12/2012    Past Surgical History:  Procedure Laterality Date   ANTERIOR CERVICAL DECOMP/DISCECTOMY FUSION  2006   BACK SURGERY     LEFT HEART CATHETERIZATION WITH CORONARY ANGIOGRAM N/A 09/14/2012   Procedure: LEFT HEART CATHETERIZATION WITH CORONARY ANGIOGRAM;  Surgeon: Thurmon Fair, MD;  Location: [facility] CATH LAB;  Service: Cardiovascular;  Laterality: N/A;    There were no vitals filed for this visit.   Subjective Assessment - 11/21/20 1141     Subjective Pt presents to PT with continued reports of lower back and L posterior hip pain. He states "I really don't feel like doing  too much today". Pt says he is grieving the recent loss of his nephew. He has not had chances in last few days to be compliant with HEP. Pt ready to begin PT at this time.    Currently in Pain? Yes    Pain Score 6     Pain Location Back    Pain Orientation Left;Posterior                               [facility] Adult PT Treatment/Exercise - 11/21/20 0001       Lumbar Exercises: Stretches   Other Lumbar Stretch Exercise modified thomas stretch 2x60 sec L      Lumbar Exercises: Supine   Pelvic Tilt 10 reps;5 seconds    Other Supine Lumbar Exercises supine ball squeeze 2x10 - 5 sec hold      Modalities    Modalities Moist Heat      Moist Heat Therapy   Number Minutes Moist Heat --   used throughout performance of supine lumbar exercises   Moist Heat Location Lumbar Spine      Manual Therapy   Manual therapy comments long axis distraction L LE; STM to L sided lumbar paraspinals; trigger point release to L QL                      PT Short Term Goals - 10/29/20 1228       PT SHORT TERM GOAL #1   Title Pt will be compliant and knowledgeable with 90% of HEP in order to improve carryover and strength    Baseline initial HEP given    Time 3    Period Weeks    Status New    Target Date 11/19/20      PT SHORT TERM GOAL #2   Title Pt will self report LBP no greater than 6/10 at worst in order to improve comfort and functional ability    Baseline 8/10 at worst    Time 3    Period Weeks    Status New    Target Date 11/19/20               PT Long Term Goals - 10/29/20 1229       PT LONG TERM GOAL #1   Title Pt will decrease 5xSTS time to no greater than 18 secs in order to improve functional mobility    Baseline 35 seconds    Time 10    Period Weeks    Status New    Target Date 01/07/21      PT LONG TERM GOAL #2   Title Pt will self report LBP no greater than 3/10 at worst in order to improve comfort and functional ability    Baseline 8/10 at worst    Time 10    Period Weeks    Status New    Target Date 01/07/21      PT LONG TERM GOAL #3   Title Pt will be able to squat and lift 40lb box with no increase in L hip or LBP in order to improve functional ability and future participation with work    Baseline unable    Time 10    Period Weeks    Status New    Target Date 01/07/21                   Plan - 11/21/20 1142  Clinical Impression Statement Pt tolerated treatment fair today but had continued discomfort that limited progression of exercises. Today's session mainly focused on reducing pain, with pt responding somewhat positively to manual  therapy interventions. He continues to show preference for L LE hip flexor stretching, which reduces symptoms at lower back and hip. PT encouraged pt to be try to be active with HEP. Will continue to assess response to interventions and progress as able.    PT Treatment/Interventions ADLs/Self Care Home Management;Electrical Stimulation;Cryotherapy;Moist Heat;Gait training;Stair training;Functional mobility training;Therapeutic activities;Therapeutic exercise;Balance training;Neuromuscular re-education;Patient/family education;Manual techniques;Dry needling;Taping;Vasopneumatic Device    PT Next Visit Plan assess HEP response; progress exercises as able    PT Home Exercise Plan Access Code: [email or web address]             Patient will benefit from skilled therapeutic intervention in order to improve the following deficits and impairments:  Abnormal gait, Decreased activity tolerance, Decreased balance, Decreased endurance, Decreased mobility, Decreased range of motion, Decreased strength, Difficulty walking, Impaired sensation, Pain  Visit Diagnosis: Chronic left-sided low back pain, unspecified whether sciatica present  Muscle weakness (generalized)  Pain in left hip     Problem List Patient Active Problem List   Diagnosis Date Noted   Unilateral primary osteoarthritis, left hip 01/10/2020   Acute renal failure (ARF) (HCC) 06/19/2018   Uncontrolled diabetes mellitus type 2 without complications 06/19/2018   Polysubstance abuse (HCC) 06/19/2018   ARF (acute renal failure) (HCC) 09/21/2017   Chest pain 09/21/2017   Left sided numbness 09/21/2017   Alcohol abuse with intoxication (HCC) 10/08/2014   Cocaine abuse with intoxication and without complication (HCC) 10/08/2014   Alcohol dependence with withdrawal, uncomplicated (HCC) 10/07/2014   Cocaine abuse (HCC) 10/07/2014   Substance induced mood disorder (HCC) 10/07/2014   Cocaine use disorder, mild, abuse (HCC)    Alcohol use disorder,  moderate, dependence (HCC)    Bipolar 1 disorder, depressed, severe (HCC) 06/10/2014   Obesity (BMI 30.0-34.9) 04/11/2013   Alcohol dependence (HCC) 11/06/2012   Cocaine dependence (HCC) 11/06/2012   Paranoid schizophrenia, chronic condition (HCC) 11/06/2012   Alcohol abuse 11/05/2012   S/P cardiac catheterization, 09/14/12 with mild non obstructive CAD and EF of 55% 09/15/2012   HTN (hypertension), benign 09/12/2012   DM type 2 (diabetes mellitus, type 2) (HCC) 09/12/2012   BPH (benign prostatic hyperplasia) 09/12/2012   Unstable angina, cardiac cath without obstruction 09/12/2012   Smoker 09/12/2012   DJD - on disability, s/p C-spine surgery 09/12/2012   Depression 09/12/2012   Bipolar disorder (HCC) 09/12/2012   Dyslipidemia- (LDL 190 in 2011) 09/12/2012    Eloy End, PT, DPT 11/21/20 11:46 AM  [facility] [address] [place], [place], [address] Phone: [phone]   Fax:  [phone]  Name: CLAUDIS GIOVANELLI MRN: [id number] Date of Birth: [birth year]  PHYSICAL THERAPY DISCHARGE SUMMARY  Visits from Start of Care: 3  Current functional level related to goals / functional outcomes: Unable to assess   Remaining deficits: Unable to assess   Education / Equipment: Unable to assess   Patient agrees to discharge. Patient goals were Unable to assess. Patient is being discharged due to not returning since the last visit.

## 2020-11-26 ENCOUNTER — Ambulatory Visit: Payer: Medicaid Other

## 2020-12-04 ENCOUNTER — Ambulatory Visit: Payer: Medicaid Other | Admitting: Orthopaedic Surgery

## 2020-12-10 ENCOUNTER — Encounter (HOSPITAL_COMMUNITY): Payer: Self-pay

## 2020-12-10 ENCOUNTER — Emergency Department (HOSPITAL_COMMUNITY)
Admission: EM | Admit: 2020-12-10 | Discharge: 2020-12-10 | Disposition: A | Discharge status: Home / Self Care | Payer: Medicaid Other | Attending: Emergency Medicine | Admitting: Emergency Medicine

## 2020-12-10 ENCOUNTER — Other Ambulatory Visit: Payer: Self-pay

## 2020-12-10 DIAGNOSIS — Z7984 Long term (current) use of oral hypoglycemic drugs: Secondary | ICD-10-CM | POA: Insufficient documentation

## 2020-12-10 DIAGNOSIS — Z79899 Other long term (current) drug therapy: Secondary | ICD-10-CM | POA: Insufficient documentation

## 2020-12-10 DIAGNOSIS — U071 COVID-19: Secondary | ICD-10-CM | POA: Diagnosis not present

## 2020-12-10 DIAGNOSIS — Z28310 Unvaccinated for covid-19: Secondary | ICD-10-CM | POA: Insufficient documentation

## 2020-12-10 DIAGNOSIS — F17210 Nicotine dependence, cigarettes, uncomplicated: Secondary | ICD-10-CM | POA: Insufficient documentation

## 2020-12-10 DIAGNOSIS — Z7982 Long term (current) use of aspirin: Secondary | ICD-10-CM | POA: Insufficient documentation

## 2020-12-10 DIAGNOSIS — E119 Type 2 diabetes mellitus without complications: Secondary | ICD-10-CM | POA: Diagnosis not present

## 2020-12-10 DIAGNOSIS — R0981 Nasal congestion: Secondary | ICD-10-CM | POA: Diagnosis present

## 2020-12-10 DIAGNOSIS — I10 Essential (primary) hypertension: Secondary | ICD-10-CM | POA: Diagnosis not present

## 2020-12-10 LAB — CBC WITH DIFFERENTIAL/PLATELET
Abs Immature Granulocytes: 0.01 10*3/uL (ref 0.00–0.07)
Basophils Absolute: 0 10*3/uL (ref 0.0–0.1)
Basophils Relative: 0 %
Eosinophils Absolute: 0.1 10*3/uL (ref 0.0–0.5)
Eosinophils Relative: 2 %
HCT: 45.3 % (ref 39.0–52.0)
Hemoglobin: 15.6 g/dL (ref 13.0–17.0)
Immature Granulocytes: 0 %
Lymphocytes Relative: 43 %
Lymphs Abs: 2.1 10*3/uL (ref 0.7–4.0)
MCH: 29.8 pg (ref 26.0–34.0)
MCHC: 34.4 g/dL (ref 30.0–36.0)
MCV: 86.6 fL (ref 80.0–100.0)
Monocytes Absolute: 0.5 10*3/uL (ref 0.1–1.0)
Monocytes Relative: 10 %
Neutro Abs: 2.1 10*3/uL (ref 1.7–7.7)
Neutrophils Relative %: 45 %
Platelets: 247 10*3/uL (ref 150–400)
RBC: 5.23 MIL/uL (ref 4.22–5.81)
RDW: 12.2 % (ref 11.5–15.5)
WBC: 4.7 10*3/uL (ref 4.0–10.5)
nRBC: 0 % (ref 0.0–0.2)

## 2020-12-10 LAB — RAPID URINE DRUG SCREEN, HOSP PERFORMED
Amphetamines: NOT DETECTED
Barbiturates: NOT DETECTED
Benzodiazepines: NOT DETECTED
Cocaine: NOT DETECTED
Opiates: NOT DETECTED
Tetrahydrocannabinol: NOT DETECTED

## 2020-12-10 LAB — COMPREHENSIVE METABOLIC PANEL
ALT: 17 U/L (ref 0–44)
AST: 19 U/L (ref 15–41)
Albumin: 3.6 g/dL (ref 3.5–5.0)
Alkaline Phosphatase: 87 U/L (ref 38–126)
Anion gap: 7 (ref 5–15)
BUN: 12 mg/dL (ref 6–20)
CO2: 24 mmol/L (ref 22–32)
Calcium: 9 mg/dL (ref 8.9–10.3)
Chloride: 103 mmol/L (ref 98–111)
Creatinine, Ser: 0.93 mg/dL (ref 0.61–1.24)
GFR, Estimated: >60 mL/min (ref >60)
Glucose, Bld: 368 mg/dL — ABNORMAL HIGH (ref 70–99)
Potassium: 3.9 mmol/L (ref 3.5–5.1)
Sodium: 134 mmol/L — ABNORMAL LOW (ref 135–145)
Total Bilirubin: 0.6 mg/dL (ref 0.3–1.2)
Total Protein: 6.7 g/dL (ref 6.5–8.1)

## 2020-12-10 LAB — RESP PANEL BY RT-PCR (FLU A&B, COVID) ARPGX2
Influenza A by PCR: NEGATIVE
Influenza B by PCR: NEGATIVE
SARS Coronavirus 2 by RT PCR: POSITIVE — AB

## 2020-12-10 LAB — ETHANOL: Alcohol, Ethyl (B): <10 mg/dL (ref <10)
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
## 2020-12-10 NOTE — ED Notes (Signed)
DC instructions reviewed with pt.  PT verbalized instructions.  PT DC.  

## 2020-12-10 NOTE — Discharge Instructions (Addendum)
You are encouraged to follow up with [facility] for outpatient treatment.  Walk in/ Open Access Hours:  Try to arrive around 7:15, as patients are seen on a first come first served basis.  Monday - Friday 8AM - 11AM (To see provider and therapist) Friday - 1PM - 4PM (To see therapist only)  [facility] [address] [place], [place] [phone]

## 2020-12-10 NOTE — ED Notes (Signed)
Happy meal provided.  
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
## 2020-12-10 NOTE — ED Notes (Signed)
TTS at bedside. 
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
## 2020-12-10 NOTE — ED Triage Notes (Signed)
Patient complains of congestion x 2 day\s. No other associated symptoms. NAD

## 2021-02-14 ENCOUNTER — Ambulatory Visit (HOSPITAL_COMMUNITY): Payer: Medicaid Other | Admitting: Licensed Clinical Social Worker

## 2021-03-05 DEATH — deceased
